# Patient Record
Sex: Female | Born: 1955 | Race: White | Hispanic: No | Marital: Married | Smoking: Never smoker
Health system: Southern US, Community
[De-identification: ages and names within clinical notes are randomized; demographics above are authoritative.]

## PROBLEM LIST (undated history)

## (undated) DIAGNOSIS — C801 Malignant (primary) neoplasm, unspecified: Secondary | ICD-10-CM

## (undated) DIAGNOSIS — Z1509 Genetic susceptibility to other malignant neoplasm: Secondary | ICD-10-CM

## (undated) DIAGNOSIS — G43909 Migraine, unspecified, not intractable, without status migrainosus: Secondary | ICD-10-CM

## (undated) DIAGNOSIS — Z1501 Genetic susceptibility to malignant neoplasm of breast: Secondary | ICD-10-CM

## (undated) DIAGNOSIS — D649 Anemia, unspecified: Secondary | ICD-10-CM

## (undated) DIAGNOSIS — T7840XA Allergy, unspecified, initial encounter: Secondary | ICD-10-CM

## (undated) DIAGNOSIS — C50919 Malignant neoplasm of unspecified site of unspecified female breast: Secondary | ICD-10-CM

## (undated) DIAGNOSIS — E041 Nontoxic single thyroid nodule: Secondary | ICD-10-CM

## (undated) HISTORY — PX: OTHER SURGICAL HISTORY: SHX169

## (undated) HISTORY — DX: Malignant neoplasm of unspecified site of unspecified female breast: C50.919

## (undated) HISTORY — PX: FOOT SURGERY: SHX648

## (undated) HISTORY — DX: Anemia, unspecified: D64.9

## (undated) HISTORY — DX: Migraine, unspecified, not intractable, without status migrainosus: G43.909

## (undated) HISTORY — DX: Nontoxic single thyroid nodule: E04.1

## (undated) HISTORY — DX: Genetic susceptibility to other malignant neoplasm: Z15.09

## (undated) HISTORY — DX: Allergy, unspecified, initial encounter: T78.40XA

## (undated) HISTORY — DX: Genetic susceptibility to malignant neoplasm of breast: Z15.01

---

## 1997-11-19 ENCOUNTER — Ambulatory Visit (HOSPITAL_COMMUNITY): Admission: RE | Admit: 1997-11-19 | Discharge: 1997-11-19 | Payer: Self-pay | Admitting: Gynecology

## 1998-05-23 ENCOUNTER — Inpatient Hospital Stay (HOSPITAL_COMMUNITY): Admission: AD | Admit: 1998-05-23 | Discharge: 1998-05-23 | Payer: Self-pay | Admitting: Gynecology

## 1998-05-23 ENCOUNTER — Encounter: Payer: Self-pay | Admitting: Gynecology

## 1998-05-24 ENCOUNTER — Ambulatory Visit (HOSPITAL_COMMUNITY): Admission: RE | Admit: 1998-05-24 | Discharge: 1998-05-24 | Payer: Self-pay | Admitting: Gynecology

## 2001-03-29 ENCOUNTER — Encounter: Payer: Self-pay | Admitting: Family Medicine

## 2001-03-29 ENCOUNTER — Encounter: Admission: RE | Admit: 2001-03-29 | Discharge: 2001-03-29 | Payer: Self-pay | Admitting: Family Medicine

## 2006-05-23 ENCOUNTER — Other Ambulatory Visit: Admission: RE | Admit: 2006-05-23 | Discharge: 2006-05-23 | Payer: Self-pay | Admitting: Family Medicine

## 2006-06-27 ENCOUNTER — Encounter: Admission: RE | Admit: 2006-06-27 | Discharge: 2006-06-27 | Payer: Self-pay | Admitting: Family Medicine

## 2006-11-14 ENCOUNTER — Other Ambulatory Visit: Admission: RE | Admit: 2006-11-14 | Discharge: 2006-11-14 | Payer: Self-pay | Admitting: Family Medicine

## 2007-12-13 ENCOUNTER — Other Ambulatory Visit: Admission: RE | Admit: 2007-12-13 | Discharge: 2007-12-13 | Payer: Self-pay | Admitting: Family Medicine

## 2007-12-24 ENCOUNTER — Encounter: Admission: RE | Admit: 2007-12-24 | Discharge: 2007-12-24 | Payer: Self-pay | Admitting: Family Medicine

## 2009-04-14 ENCOUNTER — Other Ambulatory Visit: Admission: RE | Admit: 2009-04-14 | Discharge: 2009-04-14 | Payer: Self-pay | Admitting: Family Medicine

## 2009-04-21 ENCOUNTER — Encounter: Admission: RE | Admit: 2009-04-21 | Discharge: 2009-04-21 | Payer: Self-pay | Admitting: Family Medicine

## 2012-01-12 ENCOUNTER — Other Ambulatory Visit: Payer: Self-pay | Admitting: Family Medicine

## 2012-01-12 DIAGNOSIS — Z1231 Encounter for screening mammogram for malignant neoplasm of breast: Secondary | ICD-10-CM

## 2012-02-01 ENCOUNTER — Ambulatory Visit
Admission: RE | Admit: 2012-02-01 | Discharge: 2012-02-01 | Disposition: A | Discharge status: Home / Self Care | Payer: BC Managed Care – PPO | Source: Ambulatory Visit | Attending: Family Medicine | Admitting: Family Medicine

## 2012-02-01 DIAGNOSIS — Z1231 Encounter for screening mammogram for malignant neoplasm of breast: Secondary | ICD-10-CM

## 2012-02-06 ENCOUNTER — Other Ambulatory Visit: Payer: Self-pay | Admitting: Family Medicine

## 2012-02-06 DIAGNOSIS — R928 Other abnormal and inconclusive findings on diagnostic imaging of breast: Secondary | ICD-10-CM

## 2012-02-08 ENCOUNTER — Ambulatory Visit
Admission: RE | Admit: 2012-02-08 | Discharge: 2012-02-08 | Disposition: A | Discharge status: Home / Self Care | Payer: BC Managed Care – PPO | Source: Ambulatory Visit | Attending: Family Medicine | Admitting: Family Medicine

## 2012-02-08 DIAGNOSIS — R928 Other abnormal and inconclusive findings on diagnostic imaging of breast: Secondary | ICD-10-CM

## 2013-03-18 ENCOUNTER — Other Ambulatory Visit: Payer: Self-pay | Admitting: Family Medicine

## 2013-03-18 ENCOUNTER — Ambulatory Visit
Admission: RE | Admit: 2013-03-18 | Discharge: 2013-03-18 | Disposition: A | Discharge status: Home / Self Care | Payer: BC Managed Care – PPO | Source: Ambulatory Visit | Attending: Family Medicine | Admitting: Family Medicine

## 2013-03-18 DIAGNOSIS — Z1231 Encounter for screening mammogram for malignant neoplasm of breast: Secondary | ICD-10-CM

## 2013-03-18 DIAGNOSIS — N6310 Unspecified lump in the right breast, unspecified quadrant: Secondary | ICD-10-CM

## 2013-03-19 ENCOUNTER — Ambulatory Visit
Admission: RE | Admit: 2013-03-19 | Discharge: 2013-03-19 | Disposition: A | Discharge status: Home / Self Care | Payer: BC Managed Care – PPO | Source: Ambulatory Visit | Attending: Family Medicine | Admitting: Family Medicine

## 2013-03-19 ENCOUNTER — Other Ambulatory Visit: Payer: Self-pay | Admitting: Family Medicine

## 2013-03-19 DIAGNOSIS — N6310 Unspecified lump in the right breast, unspecified quadrant: Secondary | ICD-10-CM

## 2013-03-20 ENCOUNTER — Other Ambulatory Visit: Payer: Self-pay | Admitting: Family Medicine

## 2013-03-20 ENCOUNTER — Ambulatory Visit
Admission: RE | Admit: 2013-03-20 | Discharge: 2013-03-20 | Disposition: A | Discharge status: Home / Self Care | Payer: BC Managed Care – PPO | Source: Ambulatory Visit | Attending: Family Medicine | Admitting: Family Medicine

## 2013-03-20 DIAGNOSIS — C50919 Malignant neoplasm of unspecified site of unspecified female breast: Secondary | ICD-10-CM

## 2013-03-20 DIAGNOSIS — N6310 Unspecified lump in the right breast, unspecified quadrant: Secondary | ICD-10-CM

## 2013-03-21 ENCOUNTER — Telehealth: Payer: Self-pay | Admitting: *Deleted

## 2013-03-21 DIAGNOSIS — C50411 Malignant neoplasm of upper-outer quadrant of right female breast: Secondary | ICD-10-CM | POA: Insufficient documentation

## 2013-03-21 NOTE — Telephone Encounter (Signed)
Called and spoke with patient and confirmed [facility] appt for 03/27/13 at 0800.  Instructions and contact information given.

## 2013-03-26 ENCOUNTER — Ambulatory Visit: Payer: BC Managed Care – PPO

## 2013-03-26 ENCOUNTER — Ambulatory Visit
Admission: RE | Admit: 2013-03-26 | Discharge: 2013-03-26 | Disposition: A | Discharge status: Home / Self Care | Payer: BC Managed Care – PPO | Source: Ambulatory Visit | Attending: Family Medicine | Admitting: Family Medicine

## 2013-03-26 DIAGNOSIS — C50919 Malignant neoplasm of unspecified site of unspecified female breast: Secondary | ICD-10-CM

## 2013-03-26 MED ORDER — GADOBENATE DIMEGLUMINE 529 MG/ML IV SOLN
11.0000 mL | Freq: Once | INTRAVENOUS | Status: AC | PRN
  Administered 2013-03-26: 11 mL via INTRAVENOUS

## 2013-03-27 ENCOUNTER — Ambulatory Visit (HOSPITAL_BASED_OUTPATIENT_CLINIC_OR_DEPARTMENT_OTHER): Payer: BC Managed Care – PPO | Admitting: Oncology

## 2013-03-27 ENCOUNTER — Other Ambulatory Visit (HOSPITAL_BASED_OUTPATIENT_CLINIC_OR_DEPARTMENT_OTHER): Payer: BC Managed Care – PPO | Admitting: Lab

## 2013-03-27 ENCOUNTER — Encounter (INDEPENDENT_AMBULATORY_CARE_PROVIDER_SITE_OTHER): Payer: Self-pay | Admitting: Surgery

## 2013-03-27 ENCOUNTER — Telehealth: Payer: Self-pay | Admitting: Oncology

## 2013-03-27 ENCOUNTER — Encounter: Payer: Self-pay | Admitting: *Deleted

## 2013-03-27 ENCOUNTER — Ambulatory Visit: Payer: BC Managed Care – PPO | Attending: Surgery | Admitting: Physical Therapy

## 2013-03-27 ENCOUNTER — Ambulatory Visit (HOSPITAL_BASED_OUTPATIENT_CLINIC_OR_DEPARTMENT_OTHER): Payer: BC Managed Care – PPO | Admitting: Surgery

## 2013-03-27 ENCOUNTER — Encounter: Payer: Self-pay | Admitting: Oncology

## 2013-03-27 ENCOUNTER — Telehealth: Payer: Self-pay | Admitting: *Deleted

## 2013-03-27 ENCOUNTER — Ambulatory Visit: Payer: BC Managed Care – PPO

## 2013-03-27 ENCOUNTER — Ambulatory Visit
Admission: RE | Admit: 2013-03-27 | Discharge: 2013-03-27 | Disposition: A | Discharge status: Home / Self Care | Payer: BC Managed Care – PPO | Source: Ambulatory Visit | Attending: Radiation Oncology | Admitting: Radiation Oncology

## 2013-03-27 ENCOUNTER — Other Ambulatory Visit: Payer: Self-pay | Admitting: *Deleted

## 2013-03-27 VITALS — BP 138/87 | HR 77 | Temp 98.4°F | Resp 20 | Ht 67.5 in | Wt 125.0 lb

## 2013-03-27 DIAGNOSIS — IMO0001 Reserved for inherently not codable concepts without codable children: Secondary | ICD-10-CM | POA: Insufficient documentation

## 2013-03-27 DIAGNOSIS — C50919 Malignant neoplasm of unspecified site of unspecified female breast: Secondary | ICD-10-CM | POA: Insufficient documentation

## 2013-03-27 DIAGNOSIS — C50411 Malignant neoplasm of upper-outer quadrant of right female breast: Secondary | ICD-10-CM

## 2013-03-27 DIAGNOSIS — Z171 Estrogen receptor negative status [ER-]: Secondary | ICD-10-CM

## 2013-03-27 DIAGNOSIS — C50419 Malignant neoplasm of upper-outer quadrant of unspecified female breast: Secondary | ICD-10-CM

## 2013-03-27 DIAGNOSIS — Z01818 Encounter for other preprocedural examination: Secondary | ICD-10-CM | POA: Insufficient documentation

## 2013-03-27 LAB — CBC WITH DIFFERENTIAL/PLATELET
BASO%: 0.6 % (ref 0.0–2.0)
HCT: 39.8 % (ref 34.8–46.6)
MCHC: 34.5 g/dL (ref 31.5–36.0)
MONO#: 0.3 10*3/uL (ref 0.1–0.9)
RBC: 4.25 10*6/uL (ref 3.70–5.45)
WBC: 4 10*3/uL (ref 3.9–10.3)
lymph#: 1 10*3/uL (ref 0.9–3.3)

## 2013-03-27 LAB — COMPREHENSIVE METABOLIC PANEL (CC13)
ALT: 29 U/L (ref 0–55)
CO2: 24 mEq/L (ref 22–29)
Calcium: 10.3 mg/dL (ref 8.4–10.4)
Chloride: 108 mEq/L (ref 98–109)
Potassium: 4.3 mEq/L (ref 3.5–5.1)
Sodium: 142 mEq/L (ref 136–145)
Total Protein: 6.2 g/dL — ABNORMAL LOW (ref 6.4–8.3)

## 2013-03-27 NOTE — Progress Notes (Signed)
Patient ID: Janet [name], female   DOB: 02/16/1956, 57 y.o.   MRN: [id number]  Chief Complaint  Patient presents with  . Breast Cancer    Right    HPI Janet [name] is a 57 y.o. female.  She recently felt a lump in her right breast in the nipple areolar area. 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 He is over 3 cm in size. She has suspicious lymph node in the right axilla but a biopsy was negative. She develops a swelling in the axilla since the biopsy as well as bruising of the breast. She has not had any other breast problems considered herself in extremely healthy. She is active, runs and works out and works full-time teaching law. She is already been seen by the medical and radiation oncologist.  HPI  History reviewed. No pertinent past medical history.  Past Surgical History  Procedure Laterality Date  . Eye surgery      Family History  Problem Relation Age of Onset  . Pancreatic cancer Mother   . Breast cancer Maternal Grandmother     Social History History  Substance Use Topics  . Smoking status: Never Smoker   . Smokeless tobacco: Not on file  . Alcohol Use: Yes     Comment: occasional    Allergies  Allergen Reactions  . Penicillins     Current Outpatient Prescriptions  Medication Sig Dispense Refill  . acetaminophen (TYLENOL) 325 MG tablet Take 650 mg by mouth every 6 (six) hours as needed for pain.      . CALCIUM PO Take by mouth daily.      . Multiple Vitamin (MULTI-VITAMIN DAILY PO) Take by mouth daily.       No current facility-administered medications for this visit.    Review of Systems Review of Systems  Constitutional: Negative for fever, chills and unexpected weight change.  HENT: Negative for hearing loss, congestion, sore throat, trouble swallowing and voice change.   Eyes: Negative for visual disturbance.  Respiratory: Negative for cough and wheezing.   Cardiovascular: Negative for chest  pain, palpitations and leg swelling.  Gastrointestinal: Negative for nausea, vomiting, abdominal pain, diarrhea, constipation, blood in stool, abdominal distention and anal bleeding.  Genitourinary: Negative for hematuria, vaginal bleeding and difficulty urinating.  Musculoskeletal: Negative for arthralgias.  Skin: Negative for rash and wound.  Neurological: Negative for seizures, syncope and headaches.  Hematological: Negative for adenopathy. Does not bruise/bleed easily.  Psychiatric/Behavioral: Negative for confusion.    Blood pressure 138/87, pulse 77, temperature 98.4 F (36.9 C), resp. rate 20, height 5' 7.5" (1.715 m), weight 125 lb (56.7 kg).  Physical Exam Physical Exam  Vitals reviewed. Constitutional: She is oriented to person, place, and time. She appears well-developed and well-nourished. No distress.  HENT:  Head: Normocephalic and atraumatic.  Mouth/Throat: Oropharynx is clear and moist.  Eyes: Conjunctivae and EOM are normal. Pupils are equal, round, and reactive to light. No scleral icterus.  Neck: Normal range of motion. Neck supple. No tracheal deviation present. No thyromegaly present.  Cardiovascular: Normal rate, regular rhythm, normal heart sounds and intact distal pulses.  Exam reveals no gallop and no friction rub.   No murmur heard. Pulmonary/Chest: Effort normal and breath sounds normal. No respiratory distress. She has no wheezes. She has no rales. Right breast exhibits mass. Right breast exhibits no inverted nipple, no nipple discharge, no skin change and no tenderness. Left breast exhibits no inverted nipple,   no mass, no nipple discharge, no skin change and no tenderness. Breasts are symmetrical.    Abdominal: Soft. Bowel sounds are normal. She exhibits no distension and no mass. There is no tenderness. There is no rebound and no guarding.  Musculoskeletal: Normal range of motion. She exhibits no edema and no tenderness.  Lymphadenopathy:    She has no  cervical adenopathy.    She has axillary adenopathy.       Right axillary: Pectoral adenopathy present.       Right: No supraclavicular adenopathy present.       Left: No supraclavicular adenopathy present.  Soft oblong right axillary mass - apparently developed after biopsy, likely hematoma  Neurological: She is alert and oriented to person, place, and time.  Skin: Skin is warm and dry. No rash noted. She is not diaphoretic. No erythema.  Psychiatric: She has a normal mood and affect. Her behavior is normal. Judgment and thought content normal.    Data Reviewed I reviewed the mammogram films and reports, the MRI films reports and discuss with the radiologist; I have discussed and reviewed the pathology slides for the pathologist; I discussed her situation with the medical & radiation oncologist.  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     Plan    I have explained the pathophysiology and staging of breast cancer with particular attention to her exact situation. We discussed the multidisciplinary approach to breast cancer which often includes both medical and radiation oncology consultations.  We also discussed surgical options for the treatment of breast cancer including lumpectomy and mastectomy with possible reconstructive surgery. In addition we talked about the evaluation and management of lymph nodes including a description of sentinel lymph node biopsy and axillary dissections. We reviewed potential complications and risks including bleeding, infection, numbness,  lymphedema, and the potential need for additional surgery.  She understands that for patients who are candidate for lumpectomy or mastectomy there is an equal survival rate with either technique, but a slightly higher local recurrence rate with lumpectomy. In addition she knows that a lumpectomy usually requires postoperative radiation as part of the management of the  breast cancer.  We have discussed the likely postoperative course and plans for followup.  I have given the patient some written information that reviewed all of these issues. I believe her questions are answered and that she has a good understanding of the issues.  I think she would benefit from neoadjuvant chemotherapy. She beginning chemotherapy whether she does have pre-or post surgery. If we can shrink the size of tumor I think she will have less deformity after a central partial mastectomy. Neutrexin of it might be possible to preserve the nipple and areolar complex not certain of that. I reviewed all that with the patient so that she understands that we may still end up needing to do a significant central partial mastectomy.  I've also discussed Port-A-Cath placement with her and will go ahead and schedule this.       Kuuipo Anzaldo J 03/27/2013, 11:55 AM    

## 2013-03-27 NOTE — Progress Notes (Signed)
Checked in new pt with no financial concerns. °

## 2013-03-27 NOTE — Patient Instructions (Signed)
After we discussed this with the medical oncologist, if you need a Port-A-Cath we will get this scheduled. I would like to see you in about 10 weeks, about halfway through your chemotherapy. We can assess how this is working and make more definitive plans for surgery at the completion of the chemotherapy.

## 2013-03-27 NOTE — Progress Notes (Signed)
[facility] Radiation Oncology NEW PATIENT EVALUATION  Name: Janet [name] MRN: [id number]  Date:   03/27/2013           DOB: October 26, 1955  Status: outpatient   CC: Astrid Divine, MD  Streck, Reola Mosher, MD    REFERRING PHYSICIAN: Currie Paris, MD   DIAGNOSIS: Stage IIA (T2 N0 M0) invasive ductal carcinoma of the right breast   HISTORY OF PRESENT ILLNESS:  Janet [name] is a 57 y.o. female who is seen today through the courtesy of Dr. Jamey Ripa at the [facility]. for discussion of radiation therapy in the management of her T2 N0 invasive ductal carcinoma of the right breast. She recently noted a subareolar mass on the right. A screening mammogram on 03/18/2013 showed a subareolar mass which on ultrasound measured 2.6 x 1.7 x 2.4 cm within the right breast at 11:00 and the subareolar location. There was also a suspicious lymph node within the right axilla measure 1.1 cm. On 03/19/2013 she underwent ultrasound-guided biopsies of the right breast and right axilla. On review of her pathology she was found to have invasive ductal carcinoma which was triple negative. The axillary node biopsy was negative. Breast MR on 03/26/2013 showed a 3.8 x 2.9 x 2.5 cm right breast mass along with a prominent right axillary lymph node with edema. She is without complaints today.  PREVIOUS RADIATION THERAPY: No   PAST MEDICAL HISTORY:  has a past medical history of Migraines.     PAST SURGICAL HISTORY:  Past Surgical History  Procedure Laterality Date  . Eye surgery       FAMILY HISTORY:  Her father died of pancreatic cancer in his 57s. Her mother is alive and well at 55. Her maternal grandmother died of breast cancer at 67.   SOCIAL HISTORY:  reports that she has never smoked. She does not have any smokeless tobacco history on file. She reports that  drinks alcohol. She reports that she does not use illicit drugs. Married, 3 children. She teaches law.   ALLERGIES:  Penicillins   MEDICATIONS:  Current Outpatient Prescriptions  Medication Sig Dispense Refill  . acetaminophen (TYLENOL) 325 MG tablet Take 650 mg by mouth every 6 (six) hours as needed for pain.      Marland Kitchen CALCIUM PO Take by mouth daily.      . Multiple Vitamin (MULTI-VITAMIN DAILY PO) Take by mouth daily.       No current facility-administered medications for this encounter.     REVIEW OF SYSTEMS:  Pertinent items are noted in HPI.    PHYSICAL EXAM: Alert and oriented 57 year old white female appearing her stated age. Head and neck examination: Grossly unremarkable. Nodes: Without palpable cervical, supraclavicular lymphadenopathy. There is a 3.5 cm palpable node within the right axilla which is felt to represent postbiopsy changes/hematoma. Chest: Lungs clear. Heart: Regular in rhythm. Breasts: There is a palpable 3 cm mass centered along the subareolar location at 11:00. There is no obvious skin involvement. There is no nipple retraction. Left breast without masses or lesions. Abdomen without hepatomegaly. Extremities: Without edema.    LABORATORY DATA:  Lab Results  Component Value Date   WBC 4.0 03/27/2013   HGB 13.7 03/27/2013   HCT 39.8 03/27/2013   MCV 93.7 03/27/2013   PLT 180 03/27/2013   Lab Results  Component Value Date   NA 142 03/27/2013   K 4.3 03/27/2013   CO2 24 03/27/2013   Lab Results  Component Value Date  ALT 29 03/27/2013   AST 21 03/27/2013   ALKPHOS 73 03/27/2013   BILITOT 0.46 03/27/2013      IMPRESSION: Stage IIA (T2 N0 M0) invasive ductal carcinoma of the right breast. I explained to the patient that her local treatment options include mastectomy with a sentinel lymph node biopsy or a partial mastectomy with sentinel lymph node biopsy. I defer to Dr. Darnelle Catalan and Dr. Jamey Ripa as to whether not she would benefit from neoadjuvant chemotherapy which may limit the extent of her surgery. In any case, she will be offered chemotherapy. She desires breast preservation and she  understands that she may require removal of the nipple areolar complex even with neoadjuvant chemotherapy.. We discussed the potential acute and late toxicities of radiation therapy.   PLAN: As discussed above.  I spent 40 minutes minutes face to face with the patient and more than 50% of that time was spent in counseling and/or coordination of care.

## 2013-03-27 NOTE — Telephone Encounter (Signed)
Per staff message and POF I have scheduled appts.  JMW  

## 2013-03-27 NOTE — Progress Notes (Signed)
Patient ID: DANEISHA SURGES, female   DOB: 10-22-1955, 57 y.o.   MRN: [id number] ID: [name] OB: 1956-04-18  MR#: [id number]  NWG#:[id number]  PCP: Astrid Divine, MD GYN:   SU: Cicero Duck OTHER MD: Chipper Herb, Maryelizabeth Kaufmann   HISTORY OF PRESENT ILLNESS: "[name]" herself noted a lump in her right breast on 03/17/2013, and immediately contacted her physician so that on 03/18/2013 bilateral diagnostic mammography and right breast ultrasonography at the breast Center showed a developing mass in the subareolar region of the right breast. There were no malignant type calcifications. This mass was palpable in the upper outer quadrant, and by ultrasound measured 2.6 cm. Ultrasonography of the right axilla showed a lymph node measuring 1.1 cm, with a thickened cortex. The left breast was unremarkable.  Biopsy of the right breast mass 08/26//2014 showed ([name]) and invasive ductal carcinoma, grade 3, triple negative, with an MIB-1 of 54%. Biopsy of the right axillary lymph node in question at the same time was negative.  Bilateral breast MRI 03/26/2013 measured the right subareolar mass at 3.8 cm. The previously biopsied benign right axillary lymph node was again noted. There were no other suspicious areas in the right breast or axilla and the left breast was unremarkable.  Her subsequent history is as detailed below  INTERVAL HISTORY: [name] was seen in the multidisciplinary breast cancer clinic 03/27/2013 accompanied by her husband Robby.  REVIEW OF SYSTEMS: She had recent cataract surgery with much improved vision. Otherwise a detailed review of systems today was entirely negative.   PAST MEDICAL HISTORY: Past Medical History  Diagnosis Date  . Migraines     PAST SURGICAL HISTORY: Past Surgical History  Procedure Laterality Date  . Eye surgery      FAMILY HISTORY Family History  Problem Relation Age of Onset  . Pancreatic cancer Mother   . Breast cancer  Maternal Grandmother    the patient's father died at the age of 68 with pancreatic cancer. (He was Dr. Chrystine Oiler). [name] is alive at age 27. Sallye Ober has one brother, no sisters. [name]'s mother died from breast cancer at the age of 27. There is no history of ovarian cancer in the family  GYNECOLOGIC HISTORY:  Menarche age 35, first live birth age 18. She is GX P2. She stopped having periods approximately 2004. She did not take hormone replacement. She did take birth control pills remotely for 5-10 years, with no complications.  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 She has a very busy teaching schedule, with a very long day on Monday, and all morning on Wednesdays and Fridays. Her husband the [place]. [place] Hockey "Robby" Osmanovic, is a local judge. The patient's 2 children are mildly who lives in [place] and works as a Financial risk analyst, and [place], who works in [place] and is an associate with a Set designer group.  ADVANCED DIRECTIVES: In place   HEALTH MAINTENANCE: History  Substance Use Topics  . Smoking status: Never Smoker   . Smokeless tobacco: Not on file  . Alcohol Use: Yes     Comment: occasional     Colonoscopy:  PAP:  Bone density:  Lipid panel:  Allergies  Allergen Reactions  . Penicillins     Current Outpatient Prescriptions  Medication Sig Dispense Refill  . acetaminophen (TYLENOL) 325 MG tablet Take 650 mg  by mouth every 6 (six) hours as needed for pain.      Marland Kitchen CALCIUM PO Take by mouth daily.      . Multiple Vitamin (MULTI-VITAMIN DAILY PO) Take by mouth daily.       No current facility-administered medications for this visit.    OBJECTIVE: Middle-aged white woman who appears well Filed Vitals:   03/27/13 0824  BP: 138/87  Pulse: 77  Temp: 98.4 F (36.9 C)  Resp: 20     Body mass index is 19.28 kg/(m^2).    ECOG FS:0 -  Asymptomatic  Sclerae unicteric Oropharynx clear No cervical or supraclavicular adenopathy Lungs no rales or rhonchi Heart regular rate and rhythm Abd benign MSK no focal spinal tenderness, no peripheral edema Neuro: non-focal, well-oriented, appropriate affect Breasts: The right breast mass is easily palpable under the areola around 11:00. Is movable, with no skin involvement or nipple retraction. I do not palpate a right axillary mass. The left breast is unremarkable   LAB RESULTS:  CMP     Component Value Date/Time   NA 142 03/27/2013 0807   K 4.3 03/27/2013 0807   CO2 24 03/27/2013 0807   GLUCOSE 87 03/27/2013 0807   BUN 16.7 03/27/2013 0807   CREATININE 0.8 03/27/2013 0807   CALCIUM 10.3 03/27/2013 0807   PROT 6.2* 03/27/2013 0807   ALBUMIN 3.6 03/27/2013 0807   AST 21 03/27/2013 0807   ALT 29 03/27/2013 0807   ALKPHOS 73 03/27/2013 0807   BILITOT 0.46 03/27/2013 0807    I No results found for this basename: SPEP,  UPEP,   kappa and lambda light chains    Lab Results  Component Value Date   WBC 4.0 03/27/2013   NEUTROABS 2.6 03/27/2013   HGB 13.7 03/27/2013   HCT 39.8 03/27/2013   MCV 93.7 03/27/2013   PLT 180 03/27/2013      Chemistry      Component Value Date/Time   NA 142 03/27/2013 0807   K 4.3 03/27/2013 0807   CO2 24 03/27/2013 0807   BUN 16.7 03/27/2013 0807   CREATININE 0.8 03/27/2013 0807      Component Value Date/Time   CALCIUM 10.3 03/27/2013 0807   ALKPHOS 73 03/27/2013 0807   AST 21 03/27/2013 0807   ALT 29 03/27/2013 0807   BILITOT 0.46 03/27/2013 0807       No results found for this basename: LABCA2    No components found with this basename: LABCA125    No results found for this basename: INR,  in the last 168 hours  Urinalysis No results found for this basename: colorurine,  appearanceur,  labspec,  phurine,  glucoseu,  hgbur,  bilirubinur,  ketonesur,  proteinur,  urobilinogen,  nitrite,  leukocytesur    STUDIES: US Breast Right  03/18/2013   *RADIOLOGY REPORT*  Clinical  Data:  Palpable left breast mass  DIGITAL DIAGNOSTIC BILATERAL MAMMOGRAM WITH CAD AND RIGHT BREAST ULTRASOUND:  Comparison:  With priors  Findings:  ACR Breast Density Category c:  The breast tissue is heterogeneously dense, which may obscure small masses.  There is an obscured, developing mass in the subareolar region of the right breast.  There are no malignant-type microcalcifications. The left breast is negative.  Mammographic images were processed with CAD.  On physical exam, I palpate a discrete mass in the 11 o'clock subareolar region of the right breast.  Ultrasound is performed, showing there is hypoechoic macrolobulated mass in the right breast 11 o'clock in a subareolar  location measuring 2.6 x 1.7 x 2.4 cm.  Sonographic evaluation of the right axilla shows a lymph node with a thickened cortex measuring 1.1 cm.  IMPRESSION: Imaging findings are worrisome for right breast invasive mammary carcinoma with axillary metastasis.  RECOMMENDATION: Ultrasound guided core biopsies of the right breast mass as well as the right axilla is recommended will be scheduled at the patient's convenience.  I have discussed the findings and recommendations with the patient. Results were also provided in writing at the conclusion of the visit.  If applicable, a reminder letter will be sent to the patient regarding the next appointment.  BI-RADS CATEGORY 4:  Suspicious abnormality - biopsy should be considered.   Original Report Authenticated By: Baird Lyons, M.D.   Mr Breast Bilateral W Wo Contrast  03/26/2013   *RADIOLOGY REPORT*  Clinical Data:New diagnosis of right breast cancer.  LABS  BILATERAL BREAST MRI WITH AND WITHOUT CONTRAST  Technique: Multiplanar, multisequence MR images of both breasts were obtained prior to and following the intravenous administration of 11ml of Multihance.  THREE-DIMENSIONAL MR IMAGE RENDERING ON INDEPENDENT WORKSTATION: Three-dimensional MR images were rendered by post-processing  of the  original MR data on an independent DynaCad workstation. The three-dimensional MR images were interpreted, and findings are reported in the following complete MRI report for this study.  Comparison:  [facility]amm[facility] 03/19/2013 and earlier  FINDINGS:  Breast composition:  d:  The breast tissue is extremely dense, which lowers the sensitivity of mammography.  Background parenchymal enhancement: Moderate  Right breast:  Within the medial subareolar region of the right breast, there is an enhancing mass which measures 3.8 x 2.9 x 2.5 cm.  This demonstrates fast wash-in/washout type enhancement kinetics and contains a small tissue void artifact from recent biopsy clip placed at the time of biopsy under ultrasound guidance. Biopsy showed invasive ductal carcinoma.  Elsewhere within the right breast, there are scattered foci of enhancement been no suspicious enhancement identified.  Left breast:  Scattered foci of enhancement.  No suspicious enhancement identified.  Lymph nodes:  A single right axillary lymph node shows thickened cortex.  Biopsy showed no malignancy.There is right axillary edema following ultrasound-guided core biopsy.The left axilla is negative for suspicious lymph nodes.  Ancillary findings:  None.  IMPRESSION:  1.  Solitary subareolar mass in the right breast, consistent with known malignancy. 2.  Edema and prominent right axillary lymph node, negative by biopsy.  RECOMMENDATION:  Treatment plan.  BI-RADS CATEGORY 6:  Known biopsy-proven malignancy - appropriate action should be taken.   Original Report Authenticated By: Norva Pavlov, M.D.   Mm Digital Diagnostic Bilat  03/18/2013   *RADIOLOGY REPORT*  Clinical Data:  Palpable left breast mass  DIGITAL DIAGNOSTIC BILATERAL MAMMOGRAM WITH CAD AND RIGHT BREAST ULTRASOUND:  Comparison:  With priors  Findings:  ACR Breast Density Category c:  The breast tissue is heterogeneously dense, which may obscure small  masses.  There is an obscured, developing mass in the subareolar region of the right breast.  There are no malignant-type microcalcifications. The left breast is negative.  Mammographic images were processed with CAD.  On physical exam, I palpate a discrete mass in the 11 o'clock subareolar region of the right breast.  Ultrasound is performed, showing there is hypoechoic macrolobulated mass in the right breast 11 o'clock in a subareolar location measuring 2.6 x 1.7 x 2.4 cm.  Sonographic evaluation of the right axilla shows a lymph node with a thickened  cortex measuring 1.1 cm.  IMPRESSION: Imaging findings are worrisome for right breast invasive mammary carcinoma with axillary metastasis.  RECOMMENDATION: Ultrasound guided core biopsies of the right breast mass as well as the right axilla is recommended will be scheduled at the patient's convenience.  I have discussed the findings and recommendations with the patient. Results were also provided in writing at the conclusion of the visit.  If applicable, a reminder letter will be sent to the patient regarding the next appointment.  BI-RADS CATEGORY 4:  Suspicious abnormality - biopsy should be considered.   Original Report Authenticated By: Baird Lyons, M.D.   Mm Digital Diagnostic Unilat R  03/19/2013   *RADIOLOGY REPORT*  Clinical Data:  Status post ultrasound guided right breast biopsy.  DIGITAL DIAGNOSTIC RIGHT MAMMOGRAM  Comparison:  Previous exams.  Findings:  Mammographic images were obtained following ultrasound guided biopsy of a right breast mass. Post clip images show a ribbon shaped clip in the anticipated position in the upper subareolar right breast.  IMPRESSION: Biopsy clip in the anticipated location.   Original Report Authenticated By: Jerene Dilling, M.D.   Mm Radiologist Eval And Mgmt  03/20/2013   *RADIOLOGY REPORT*  ESTABLISHED PATIENT OFFICE VISIT - LEVEL II [phone])  Chief Complaint:  Patient status post ultrasound-guided core needle  biopsy of right breast mass, presenting for pathology results.  History:  Newly palpable right breast mass status post ultrasound- guided core needle biopsy.  Exam:  The biopsy sites involving the right breast and the right axilla are unremarkable without significant erythema, hematoma or drainage.  Pathology: Right breast mass compatible with invasive carcinoma.  Assessment and Plan:  The next steps including the treatment plan to include breast MRI and multidisciplinary breast conference appointments were discussed with the patient.   Original Report Authenticated By: Annia Belt, M.D   [place] Rt Breast Bx W Loc Dev 1st Lesion Img Bx Spec US Guide  03/19/2013   *RADIOLOGY REPORT*  Clinical Data:  Right breast mass and abnormal right axillary lymph node.  ULTRASOUND GUIDED VACUUM ASSISTED CORE BIOPSY OF THE RIGHT BREAST  Comparison: Previous exams.  I met with the patient and we discussed the procedure of ultrasound- guided biopsy, including benefits and alternatives.  We discussed the high likelihood of a successful procedure. We discussed the risks of the procedure including infection, bleeding, tissue injury, clip migration, and inadequate sampling.  Informed written consent was given. The usual time-out protocol was performed immediately prior to the procedure.  Using sterile technique and 2% Lidocaine as local anesthetic, under direct ultrasound visualization, a 12 gauge vacuum-assisted device was used to perform biopsy of right breast mass using a lateral to medial approach. At the conclusion of the procedure, a ribbon shaped tissue marker clip was deployed into the biopsy cavity. Follow-up 2-view mammogram was performed and dictated separately.  IMPRESSION: Ultrasound-guided biopsy of right breast mass.  No apparent complications.   Original Report Authenticated By: Jerene Dilling, M.D.   [place] Rt Breast Bx W Loc Dev Ea Add Lesion Img Bx Spec US Guide  03/19/2013   *RADIOLOGY REPORT*  Clinical Data:  Right  breast mass and abnormal right axillary lymph node.  ULTRASOUND GUIDED VACUUM ASSISTED CORE BIOPSY OF THE RIGHT AXILLA  Comparison: Previous exams.  I met with the patient and we discussed the procedure of ultrasound- guided biopsy, including benefits and alternatives.  We discussed the high likelihood of a successful procedure. We discussed the risks of the procedure including infection, bleeding, tissue injury,  clip migration, and inadequate sampling.  Informed written consent was given. The usual time-out protocol was performed immediately prior to the procedure.  Using sterile technique and 2% Lidocaine as local anesthetic, under direct ultrasound visualization, a 12 gauge vacuum-assisted device was used to perform biopsy of right axillary lymph node using a inferior to superior approach.  IMPRESSION: Ultrasound-guided biopsy of a right axillary lymph node.  No apparent complications.   Original Report Authenticated By: Jerene Dilling, M.D.    ASSESSMENT: 57 y.o. [place] woman status post right breast biopsy 03/19/2013 for a clinical T2 N0, stage IIA invasive ductal breast cancer, grade 3, triple negative, with an MIB-1 of 54%.  (1) suspicious right axillary lymph node biopsy negative  PLAN: We spent the better part of today's hour-long visit discussing the biology of breast cancer in general and the specifics of for a lung tumor. She understands that she is very likely to lose the nipple/areolar complex regardless of whether she has neoadjuvant or adjuvant chemotherapy. However there is a slight chance you might be able to keep her nipple depending on response, and there is no difference in survival whether she has chemotherapy first or surgery first.  After much discussion we decided it was best for her to start with chemotherapy, and she accordingly has been set up for a port, echocardiogram, and "chemotherapy school". The plan will be for her to receive 4 cycles of doxorubicin and  cyclophosphamide at standard doses every 2 weeks x4, with Neulasta support, to be followed by weekly carboplatin and paclitaxel x12, again at standard doses. She understands the goal of treatment is cure. We also discussed briefly the possible toxicities, side effects and complications of treatment, which will be discussed in more detail in "chemotherapy school", and at our next visit, September 18, when she will also receive her prescriptions for anti-emetics and other supportive meds.  [name] has a very complex scheduled which is very rigid and we will do our best to accommodate it. For that reason I have written for her chemotherapy to start at 1 PM on Wednesdays. Once we get to the second part of her chemotherapy, the weekly treatment, we will switch to Friday afternoons treatments.  The patient is a good understanding of this plan and is very much in agreement with that. She knows to call for any problems that may develop before her next visit here.   Lowella Dell, MD   03/27/2013 12:34 PM

## 2013-03-28 ENCOUNTER — Other Ambulatory Visit: Payer: Self-pay | Admitting: Oncology

## 2013-03-28 ENCOUNTER — Encounter: Payer: Self-pay | Admitting: *Deleted

## 2013-03-28 ENCOUNTER — Other Ambulatory Visit: Payer: BC Managed Care – PPO

## 2013-03-28 NOTE — Progress Notes (Signed)
[facility] Clinical Social Work  Clinical Social Work met with Pt and her husband during breast clinic to review distress screen and discuss [facility] resources/support team. CSW reviewed distress screen with Pt and discussed further. CSW reviewed distress screen with Pt and Pt scored a 7, but now feels about a 4. Pt made many, many notes on her distress screen as to the items that were causing distress. Those included anxiety about her appointment, not being able to continue work obligations and not knowing her plan for tx. CSW discussed these concerns further and problem solved as well as provided supportive listening. CSW tried to "normalize" the wide range of emotions Pts often feel. CSW provided Pt and husband with support staff handout and calendar. Pt was very interested in many of the groups and programs. She agrees to reach out for asst. as needed.   Doreen Salvage, LCSW Clinical Social Worker [facility] Wednesday, Thursday and Friday Phone: [phone] Fax: [phone]

## 2013-03-29 ENCOUNTER — Telehealth: Payer: Self-pay | Admitting: Oncology

## 2013-03-29 NOTE — Telephone Encounter (Signed)
, °

## 2013-04-01 ENCOUNTER — Telehealth: Payer: Self-pay | Admitting: *Deleted

## 2013-04-01 ENCOUNTER — Encounter (HOSPITAL_COMMUNITY): Payer: Self-pay | Admitting: *Deleted

## 2013-04-01 NOTE — Telephone Encounter (Signed)
sw pt gv a lab appt for 04/23/13@ 3pm, and inj for 9/25 @ 3:15pm..the patient is aware...td

## 2013-04-02 ENCOUNTER — Ambulatory Visit (HOSPITAL_COMMUNITY)
Admission: RE | Admit: 2013-04-02 | Discharge: 2013-04-02 | Disposition: A | Discharge status: Home / Self Care | Payer: BC Managed Care – PPO | Source: Ambulatory Visit | Attending: Family Medicine | Admitting: Family Medicine

## 2013-04-02 DIAGNOSIS — Z01818 Encounter for other preprocedural examination: Secondary | ICD-10-CM | POA: Insufficient documentation

## 2013-04-02 DIAGNOSIS — Z5111 Encounter for antineoplastic chemotherapy: Secondary | ICD-10-CM

## 2013-04-02 DIAGNOSIS — C50919 Malignant neoplasm of unspecified site of unspecified female breast: Secondary | ICD-10-CM

## 2013-04-02 NOTE — Progress Notes (Signed)
Echocardiogram 2D Echocardiogram has been performed.  Janet [name] 04/02/2013, 3:16 PM

## 2013-04-04 ENCOUNTER — Encounter (HOSPITAL_COMMUNITY): Payer: Self-pay | Admitting: Anesthesiology

## 2013-04-04 ENCOUNTER — Ambulatory Visit (HOSPITAL_COMMUNITY): Payer: BC Managed Care – PPO

## 2013-04-04 ENCOUNTER — Ambulatory Visit (HOSPITAL_COMMUNITY)
Admission: RE | Admit: 2013-04-04 | Discharge: 2013-04-04 | Disposition: A | Discharge status: Home / Self Care | Payer: BC Managed Care – PPO | Source: Ambulatory Visit | Attending: Surgery | Admitting: Surgery

## 2013-04-04 ENCOUNTER — Encounter (HOSPITAL_COMMUNITY)
Admission: RE | Disposition: A | Discharge status: Home / Self Care | Payer: Self-pay | Source: Ambulatory Visit | Attending: Surgery

## 2013-04-04 ENCOUNTER — Encounter (HOSPITAL_COMMUNITY): Payer: Self-pay | Admitting: *Deleted

## 2013-04-04 ENCOUNTER — Ambulatory Visit (HOSPITAL_COMMUNITY): Payer: BC Managed Care – PPO | Admitting: Anesthesiology

## 2013-04-04 DIAGNOSIS — C50419 Malignant neoplasm of upper-outer quadrant of unspecified female breast: Secondary | ICD-10-CM

## 2013-04-04 DIAGNOSIS — C50919 Malignant neoplasm of unspecified site of unspecified female breast: Secondary | ICD-10-CM | POA: Insufficient documentation

## 2013-04-04 DIAGNOSIS — C50411 Malignant neoplasm of upper-outer quadrant of right female breast: Secondary | ICD-10-CM

## 2013-04-04 HISTORY — PX: PORTACATH PLACEMENT: SHX2246

## 2013-04-04 HISTORY — DX: Malignant (primary) neoplasm, unspecified: C80.1

## 2013-04-04 SURGERY — INSERTION, TUNNELED CENTRAL VENOUS DEVICE, WITH PORT
Anesthesia: Monitor Anesthesia Care | Site: Chest | Wound class: Clean

## 2013-04-04 MED ORDER — BUPIVACAINE HCL 0.25 % IJ SOLN
INTRAMUSCULAR | Status: DC | PRN
  Administered 2013-04-04: 10 mL

## 2013-04-04 MED ORDER — FENTANYL CITRATE 0.05 MG/ML IJ SOLN
25.0000 ug | INTRAMUSCULAR | Status: DC | PRN
  Administered 2013-04-04 (×2): 50 ug via INTRAVENOUS

## 2013-04-04 MED ORDER — MEPERIDINE HCL 50 MG/ML IJ SOLN: 6.2500 mg | INTRAMUSCULAR | Status: DC | PRN

## 2013-04-04 MED ORDER — PROMETHAZINE HCL 25 MG/ML IJ SOLN: 6.2500 mg | INTRAMUSCULAR | Status: DC | PRN

## 2013-04-04 MED ORDER — CIPROFLOXACIN IN D5W 400 MG/200ML IV SOLN
400.0000 mg | INTRAVENOUS | Status: AC
  Administered 2013-04-04: 400 mg via INTRAVENOUS

## 2013-04-04 MED ORDER — ONDANSETRON HCL 4 MG/2ML IJ SOLN
INTRAMUSCULAR | Status: DC | PRN
  Administered 2013-04-04 (×2): 2 mg via INTRAVENOUS

## 2013-04-04 MED ORDER — LIDOCAINE HCL (CARDIAC) 20 MG/ML IV SOLN
INTRAVENOUS | Status: DC | PRN
  Administered 2013-04-04: 30 mg via INTRAVENOUS

## 2013-04-04 MED ORDER — CIPROFLOXACIN IN D5W 400 MG/200ML IV SOLN
INTRAVENOUS | Status: AC
  Filled 2013-04-04: qty 200

## 2013-04-04 MED ORDER — BUPIVACAINE HCL (PF) 0.25 % IJ SOLN
INTRAMUSCULAR | Status: AC
  Filled 2013-04-04: qty 30

## 2013-04-04 MED ORDER — HEPARIN SOD (PORK) LOCK FLUSH 100 UNIT/ML IV SOLN
INTRAVENOUS | Status: DC | PRN
  Administered 2013-04-04: 500 [IU] via INTRAVENOUS

## 2013-04-04 MED ORDER — LACTATED RINGERS IV SOLN: INTRAVENOUS | Status: DC

## 2013-04-04 MED ORDER — FENTANYL CITRATE 0.05 MG/ML IJ SOLN
INTRAMUSCULAR | Status: DC | PRN
  Administered 2013-04-04 (×2): 50 ug via INTRAVENOUS

## 2013-04-04 MED ORDER — HYDROCODONE-ACETAMINOPHEN 5-325 MG PO TABS: 1.0000 | ORAL_TABLET | ORAL | Status: DC | PRN

## 2013-04-04 MED ORDER — LACTATED RINGERS IV SOLN
INTRAVENOUS | Status: DC
  Administered 2013-04-04: 1000 mL via INTRAVENOUS

## 2013-04-04 MED ORDER — EPHEDRINE SULFATE 50 MG/ML IJ SOLN
INTRAMUSCULAR | Status: DC | PRN
  Administered 2013-04-04: 10 mg via INTRAVENOUS
  Administered 2013-04-04: 5 mg via INTRAVENOUS

## 2013-04-04 MED ORDER — HEPARIN SOD (PORK) LOCK FLUSH 100 UNIT/ML IV SOLN
INTRAVENOUS | Status: AC
  Filled 2013-04-04: qty 5

## 2013-04-04 MED ORDER — LACTATED RINGERS IV SOLN
INTRAVENOUS | Status: DC | PRN
  Administered 2013-04-04 (×2): via INTRAVENOUS

## 2013-04-04 MED ORDER — FENTANYL CITRATE 0.05 MG/ML IJ SOLN
INTRAMUSCULAR | Status: AC
  Filled 2013-04-04: qty 2

## 2013-04-04 MED ORDER — IOHEXOL 300 MG/ML  SOLN
INTRAMUSCULAR | Status: DC | PRN
  Administered 2013-04-04: 10 mL via INTRAVENOUS

## 2013-04-04 MED ORDER — CHLORHEXIDINE GLUCONATE 4 % EX LIQD
1.0000 "application " | Freq: Once | CUTANEOUS | Status: DC
  Filled 2013-04-04: qty 15

## 2013-04-04 MED ORDER — KETAMINE HCL 10 MG/ML IJ SOLN
INTRAMUSCULAR | Status: DC | PRN
  Administered 2013-04-04: 10 mg via INTRAVENOUS

## 2013-04-04 MED ORDER — SODIUM CHLORIDE 0.9 % IR SOLN
Freq: Once | Status: AC
Start: 2013-04-04 — End: 2013-04-04
  Administered 2013-04-04: 10:00:00
  Filled 2013-04-04: qty 1.2

## 2013-04-04 MED ORDER — PROPOFOL 10 MG/ML IV BOLUS
INTRAVENOUS | Status: DC | PRN
  Administered 2013-04-04: 150 mg via INTRAVENOUS

## 2013-04-04 SURGICAL SUPPLY — 42 items
BAG DECANTER FOR FLEXI CONT (MISCELLANEOUS) ×2 IMPLANT
BENZOIN TINCTURE PRP APPL 2/3 (GAUZE/BANDAGES/DRESSINGS) ×2 IMPLANT
BLADE HEX COATED 2.75 (ELECTRODE) ×2 IMPLANT
BLADE SURG 15 STRL LF DISP TIS (BLADE) ×1 IMPLANT
BLADE SURG 15 STRL SS (BLADE) ×1
CANISTER SUCTION 2500CC (MISCELLANEOUS) ×2 IMPLANT
CHLORAPREP W/TINT 26ML (MISCELLANEOUS) ×2 IMPLANT
CLOTH BEACON ORANGE TIMEOUT ST (SAFETY) ×2 IMPLANT
COVER SURGICAL LIGHT HANDLE (MISCELLANEOUS) ×2 IMPLANT
DECANTER SPIKE VIAL GLASS SM (MISCELLANEOUS) ×2 IMPLANT
DERMABOND ADVANCED (GAUZE/BANDAGES/DRESSINGS) ×1
DERMABOND ADVANCED .7 DNX12 (GAUZE/BANDAGES/DRESSINGS) ×1 IMPLANT
DRAPE C-ARM 42X120 X-RAY (DRAPES) ×2 IMPLANT
DRAPE LAPAROTOMY TRNSV 102X78 (DRAPE) ×2 IMPLANT
DRSG TEGADERM 4X4.75 (GAUZE/BANDAGES/DRESSINGS) ×2 IMPLANT
ELECT REM PT RETURN 9FT ADLT (ELECTROSURGICAL) ×2
ELECTRODE REM PT RTRN 9FT ADLT (ELECTROSURGICAL) ×1 IMPLANT
GAUZE SPONGE 4X4 16PLY XRAY LF (GAUZE/BANDAGES/DRESSINGS) ×2 IMPLANT
GLOVE BIOGEL PI IND STRL 7.0 (GLOVE) ×1 IMPLANT
GLOVE BIOGEL PI INDICATOR 7.0 (GLOVE) ×1
GLOVE EUDERMIC 7 POWDERFREE (GLOVE) ×4 IMPLANT
GOWN STRL NON-REIN LRG LVL3 (GOWN DISPOSABLE) ×2 IMPLANT
GOWN STRL REIN XL XLG (GOWN DISPOSABLE) ×4 IMPLANT
KIT BASIN OR (CUSTOM PROCEDURE TRAY) ×2 IMPLANT
KIT PORT POWER ISP 8FR (Catheter) IMPLANT
KIT POWER CATH 8FR (Catheter) IMPLANT
KIT POWER PORT SLIM 6FR (PORTABLE EQUIPMENT SUPPLIES) ×2 IMPLANT
NEEDLE HYPO 25X1 1.5 SAFETY (NEEDLE) ×2 IMPLANT
NS IRRIG 1000ML POUR BTL (IV SOLUTION) ×2 IMPLANT
PACK BASIC VI WITH GOWN DISP (CUSTOM PROCEDURE TRAY) ×2 IMPLANT
PENCIL BUTTON HOLSTER BLD 10FT (ELECTRODE) ×2 IMPLANT
SPONGE GAUZE 4X4 12PLY (GAUZE/BANDAGES/DRESSINGS) ×2 IMPLANT
STRIP CLOSURE SKIN 1/2X4 (GAUZE/BANDAGES/DRESSINGS) ×2 IMPLANT
SUT MNCRL AB 4-0 PS2 18 (SUTURE) ×2 IMPLANT
SUT PROLENE 2 0 SH DA (SUTURE) ×2 IMPLANT
SUT SILK 2 0 (SUTURE) ×1
SUT SILK 2-0 18XBRD TIE 12 (SUTURE) ×1 IMPLANT
SUT VIC AB 3-0 SH 18 (SUTURE) ×2 IMPLANT
SYR CONTROL 10ML LL (SYRINGE) ×2 IMPLANT
SYRINGE 10CC LL (SYRINGE) ×2 IMPLANT
TOWEL OR 17X26 10 PK STRL BLUE (TOWEL DISPOSABLE) ×2 IMPLANT
YANKAUER SUCT BULB TIP 10FT TU (MISCELLANEOUS) ×2 IMPLANT

## 2013-04-04 NOTE — Anesthesia Postprocedure Evaluation (Signed)
  Anesthesia Post-op Note  Patient: Janet [name]  Procedure(s) Performed: Procedure(s) (LRB): INSERTION PORT-A-CATH (N/A)  Patient Location: PACU  Anesthesia Type: General  Level of Consciousness: awake and alert   Airway and Oxygen Therapy: Patient Spontanous Breathing  Post-op Pain: mild  Post-op Assessment: Post-op Vital signs reviewed, Patient's Cardiovascular Status Stable, Respiratory Function Stable, Patent Airway and No signs of Nausea or vomiting  Last Vitals:  Filed Vitals:   04/04/13 1320  BP: 119/64  Pulse:   Temp:   Resp: 16    Post-op Vital Signs: stable   Complications: No apparent anesthesia complications

## 2013-04-04 NOTE — Anesthesia Preprocedure Evaluation (Addendum)
Anesthesia Evaluation  Patient identified by MRN, date of birth, ID band Patient awake    Reviewed: Allergy & Precautions, H&P , NPO status , Patient's Chart, lab work & pertinent test results  Airway Mallampati: II TM Distance: >3 FB Neck ROM: Full    Dental no notable dental hx.    Pulmonary neg pulmonary ROS,  breath sounds clear to auscultation  Pulmonary exam normal       Cardiovascular negative cardio ROS  Rhythm:Regular Rate:Normal     Neuro/Psych negative neurological ROS  negative psych ROS   GI/Hepatic negative GI ROS, Neg liver ROS,   Endo/Other  negative endocrine ROS  Renal/GU negative Renal ROS  negative genitourinary   Musculoskeletal negative musculoskeletal ROS (+)   Abdominal   Peds negative pediatric ROS (+)  Hematology negative hematology ROS (+)   Anesthesia Other Findings Upper front left cap  Reproductive/Obstetrics negative OB ROS                           Anesthesia Physical Anesthesia Plan  ASA: I  Anesthesia Plan: MAC   Post-op Pain Management:    Induction: Intravenous  Airway Management Planned: Simple Face Mask and Nasal Cannula  Additional Equipment:   Intra-op Plan:   Post-operative Plan: Extubation in OR  Informed Consent: I have reviewed the patients History and Physical, chart, labs and discussed the procedure including the risks, benefits and alternatives for the proposed anesthesia with the patient or authorized representative who has indicated his/her understanding and acceptance.   Dental advisory given  Plan Discussed with: CRNA  Anesthesia Plan Comments:         Anesthesia Quick Evaluation

## 2013-04-04 NOTE — Op Note (Signed)
Janet [name] [place] March 16, 1956 [id number] 03/28/2013  Preoperative diagnosis: PAC needed  Postoperative diagnosis: Same  Procedure: Portacath Placement  Surgeon: Currie Paris, MD, FACS  Assistant: Suann Larry, PA-S  Anesthesia: General  Clinical History and Indications: The patient is getting ready to begin chemotherapy for her cancer. She  needs a Port-A-Cath for venous access.  Description of Procedure: I have seen the patient in the holding area and confirmed the plans for the procedure as noted above. I reviewed the risks and complications again and the patient has no further questions. She wishes to proceed.   The patient was then taken to the operating room. After satisfactory general anesthesia had been obtained the upper chest and lower neck were prepped and draped as a sterile field. The timeout was done.  The left subclavian vein was entered and the guidewire threaded into the superior vena cava right atrial area under fluoroscopic guidance. An incision was then made on the anterior chest wall and a subcutaneous pocket fashioned for the port reservoir.  The port tubing was then brought through a subcutaneous tunnel from the port site to the guidewire site. The dilator and peel-away sheath were then advanced over the guidewire while monitoring this with fluoroscopy. The guidewire and dilator were removed and the tubing threaded to approximately 22 cm. The peel-away sheath was then removed. The catheter aspirated and flushed easily. Using fluoroscopy the tip was backed out into the superior vena cava right atrial junction area. It aspirated and flushed easily. The reservoir was attached and the locking mechanism engaged. That aspirated and flushed easily.  The reservoir was secured to the fascia with 2 sutures of 2-0 Prolene. A final check with fluoroscopy was done to make sure we had no kinks and good positioning of the tip of the catheter. Everything appeared to be okay. The  catheter was aspirated, flushed with dilute heparin and then concentrated aqueous heparin.  The incision was then closed with interrupted 3-0 Vicryl, and 4-0 Monocryl subcuticular with Dermabond on the skin.  There were no operative complications. Estimated blood loss was minimal. All counts were correct. The patient tolerated the procedure well.  Currie Paris, MD, FACS 04/04/2013 10:56 AM

## 2013-04-04 NOTE — Transfer of Care (Signed)
Immediate Anesthesia Transfer of Care Note  Patient: Janet [name]  Procedure(s) Performed: Procedure(s): INSERTION PORT-A-CATH (N/A)  Patient Location: PACU  Anesthesia Type:General  Level of Consciousness: awake, alert , oriented and patient cooperative  Airway & Oxygen Therapy: Patient Spontanous Breathing and Patient connected to nasal cannula oxygen  Post-op Assessment: Report given to PACU RN and Post -op Vital signs reviewed and stable  Post vital signs: stable  Complications: No apparent anesthesia complications

## 2013-04-04 NOTE — H&P (View-Only) (Signed)
Patient ID: Janet [name], female   DOB: 1955-09-17, 57 y.o.   MRN: [id number]  Chief Complaint  Patient presents with  . Breast Cancer    Right    HPI Janet [name] is a 57 y.o. female.  She recently felt a lump in her right breast in the nipple areolar area. 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 He is over 3 cm in size. She has suspicious lymph node in the right axilla but a biopsy was negative. She develops a swelling in the axilla since the biopsy as well as bruising of the breast. She has not had any other breast problems considered herself in extremely healthy. She is active, runs and works out and works Armed forces training and education officer. She is already been seen by the medical and radiation oncologist.  HPI  History reviewed. No pertinent past medical history.  Past Surgical History  Procedure Laterality Date  . Eye surgery      Family History  Problem Relation Age of Onset  . Pancreatic cancer Mother   . Breast cancer Maternal Grandmother     Social History History  Substance Use Topics  . Smoking status: Never Smoker   . Smokeless tobacco: Not on file  . Alcohol Use: Yes     Comment: occasional    Allergies  Allergen Reactions  . Penicillins     Current Outpatient Prescriptions  Medication Sig Dispense Refill  . acetaminophen (TYLENOL) 325 MG tablet Take 650 mg by mouth every 6 (six) hours as needed for pain.      Marland Kitchen CALCIUM PO Take by mouth daily.      . Multiple Vitamin (MULTI-VITAMIN DAILY PO) Take by mouth daily.       No current facility-administered medications for this visit.    Review of Systems Review of Systems  Constitutional: Negative for fever, chills and unexpected weight change.  HENT: Negative for hearing loss, congestion, sore throat, trouble swallowing and voice change.   Eyes: Negative for visual disturbance.  Respiratory: Negative for cough and wheezing.   Cardiovascular: Negative for chest  pain, palpitations and leg swelling.  Gastrointestinal: Negative for nausea, vomiting, abdominal pain, diarrhea, constipation, blood in stool, abdominal distention and anal bleeding.  Genitourinary: Negative for hematuria, vaginal bleeding and difficulty urinating.  Musculoskeletal: Negative for arthralgias.  Skin: Negative for rash and wound.  Neurological: Negative for seizures, syncope and headaches.  Hematological: Negative for adenopathy. Does not bruise/bleed easily.  Psychiatric/Behavioral: Negative for confusion.    Blood pressure 138/87, pulse 77, temperature 98.4 F (36.9 C), resp. rate 20, height 5' 7.5" (1.715 m), weight 125 lb (56.7 kg).  Physical Exam Physical Exam  Vitals reviewed. Constitutional: She is oriented to person, place, and time. She appears well-developed and well-nourished. No distress.  HENT:  Head: Normocephalic and atraumatic.  Mouth/Throat: Oropharynx is clear and moist.  Eyes: Conjunctivae and EOM are normal. Pupils are equal, round, and reactive to light. No scleral icterus.  Neck: Normal range of motion. Neck supple. No tracheal deviation present. No thyromegaly present.  Cardiovascular: Normal rate, regular rhythm, normal heart sounds and intact distal pulses.  Exam reveals no gallop and no friction rub.   No murmur heard. Pulmonary/Chest: Effort normal and breath sounds normal. No respiratory distress. She has no wheezes. She has no rales. Right breast exhibits mass. Right breast exhibits no inverted nipple, no nipple discharge, no skin change and no tenderness. Left breast exhibits no inverted nipple,  no mass, no nipple discharge, no skin change and no tenderness. Breasts are symmetrical.    Abdominal: Soft. Bowel sounds are normal. She exhibits no distension and no mass. There is no tenderness. There is no rebound and no guarding.  Musculoskeletal: Normal range of motion. She exhibits no edema and no tenderness.  Lymphadenopathy:    She has no  cervical adenopathy.    She has axillary adenopathy.       Right axillary: Pectoral adenopathy present.       Right: No supraclavicular adenopathy present.       Left: No supraclavicular adenopathy present.  Soft oblong right axillary mass - apparently developed after biopsy, likely hematoma  Neurological: She is alert and oriented to person, place, and time.  Skin: Skin is warm and dry. No rash noted. She is not diaphoretic. No erythema.  Psychiatric: She has a normal mood and affect. Her behavior is normal. Judgment and thought content normal.    Data Reviewed I reviewed the mammogram films and reports, the MRI films reports and discuss with the radiologist; I have discussed and reviewed the pathology slides for the pathologist; I discussed her situation with the medical & radiation oncologist.  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     Plan    I have explained the pathophysiology and staging of breast cancer with particular attention to her exact situation. We discussed the multidisciplinary approach to breast cancer which often includes both medical and radiation oncology consultations.  We also discussed surgical options for the treatment of breast cancer including lumpectomy and mastectomy with possible reconstructive surgery. In addition we talked about the evaluation and management of lymph nodes including a description of sentinel lymph node biopsy and axillary dissections. We reviewed potential complications and risks including bleeding, infection, numbness,  lymphedema, and the potential need for additional surgery.  She understands that for patients who are candidate for lumpectomy or mastectomy there is an equal survival rate with either technique, but a slightly higher local recurrence rate with lumpectomy. In addition she knows that a lumpectomy usually requires postoperative radiation as part of the management of the  breast cancer.  We have discussed the likely postoperative course and plans for followup.  I have given the patient some written information that reviewed all of these issues. I believe her questions are answered and that she has a good understanding of the issues.  I think she would benefit from neoadjuvant chemotherapy. She beginning chemotherapy whether she does have pre-or post surgery. If we can shrink the size of tumor I think she will have less deformity after a central partial mastectomy. Neutrexin of it might be possible to preserve the nipple and areolar complex not certain of that. I reviewed all that with the patient so that she understands that we may still end up needing to do a significant central partial mastectomy.  I've also discussed Port-A-Cath placement with her and will go ahead and schedule this.       Beadie Matsunaga J 03/27/2013, 11:55 AM

## 2013-04-04 NOTE — Interval H&P Note (Signed)
History and Physical Interval Note:  04/04/2013 9:15 AM  Janet [name]  has presented today for surgery, with the diagnosis of right breast cancer   The various methods of treatment have been discussed with the patient and family. After consideration of risks, benefits and other options for treatment, the patient has consented to  Procedure(s): INSERTION PORT-A-CATH (N/A) as a surgical intervention .  The patient's history has been reviewed, patient examined, no change in status, stable for surgery.  I have reviewed the patient's chart and labs.  Questions were answered to the patient's satisfaction.     Ettie Krontz J

## 2013-04-05 ENCOUNTER — Encounter (HOSPITAL_COMMUNITY): Payer: Self-pay | Admitting: Surgery

## 2013-04-09 ENCOUNTER — Encounter: Payer: Self-pay | Admitting: Oncology

## 2013-04-09 NOTE — Progress Notes (Signed)
Hubby left a message on voicemail about ins coverage. I called and left a mess on his voicemail that they will have to call insurance and confirm they have coverage.

## 2013-04-11 ENCOUNTER — Other Ambulatory Visit (HOSPITAL_BASED_OUTPATIENT_CLINIC_OR_DEPARTMENT_OTHER): Payer: BC Managed Care – PPO | Admitting: Lab

## 2013-04-11 ENCOUNTER — Telehealth: Payer: Self-pay | Admitting: *Deleted

## 2013-04-11 ENCOUNTER — Ambulatory Visit (HOSPITAL_BASED_OUTPATIENT_CLINIC_OR_DEPARTMENT_OTHER): Payer: BC Managed Care – PPO | Admitting: Oncology

## 2013-04-11 VITALS — BP 124/75 | HR 73 | Temp 98.6°F | Resp 18 | Ht 69.0 in | Wt 124.2 lb

## 2013-04-11 DIAGNOSIS — C50419 Malignant neoplasm of upper-outer quadrant of unspecified female breast: Secondary | ICD-10-CM

## 2013-04-11 DIAGNOSIS — C50919 Malignant neoplasm of unspecified site of unspecified female breast: Secondary | ICD-10-CM

## 2013-04-11 DIAGNOSIS — C50411 Malignant neoplasm of upper-outer quadrant of right female breast: Secondary | ICD-10-CM

## 2013-04-11 LAB — CBC WITH DIFFERENTIAL/PLATELET
BASO%: 0.5 % (ref 0.0–2.0)
Basophils Absolute: 0 10*3/uL (ref 0.0–0.1)
EOS%: 1.1 % (ref 0.0–7.0)
HGB: 13.8 g/dL (ref 11.6–15.9)
MCH: 31.9 pg (ref 25.1–34.0)
MCHC: 34.3 g/dL (ref 31.5–36.0)
MONO#: 0.5 10*3/uL (ref 0.1–0.9)
RDW: 12.6 % (ref 11.2–14.5)
WBC: 6.2 10*3/uL (ref 3.9–10.3)
lymph#: 1.5 10*3/uL (ref 0.9–3.3)

## 2013-04-11 MED ORDER — LORAZEPAM 0.5 MG PO TABS: 0.5000 mg | ORAL_TABLET | Freq: Four times a day (QID) | ORAL | Status: DC | PRN

## 2013-04-11 MED ORDER — LIDOCAINE-PRILOCAINE 2.5-2.5 % EX CREA: TOPICAL_CREAM | CUTANEOUS | Status: DC | PRN

## 2013-04-11 MED ORDER — DEXAMETHASONE 4 MG PO TABS: ORAL_TABLET | ORAL | Status: DC

## 2013-04-11 MED ORDER — PROCHLORPERAZINE MALEATE 10 MG PO TABS: 10.0000 mg | ORAL_TABLET | Freq: Four times a day (QID) | ORAL | Status: DC | PRN

## 2013-04-11 MED ORDER — ONDANSETRON HCL 8 MG PO TABS: 8.0000 mg | ORAL_TABLET | Freq: Two times a day (BID) | ORAL | Status: DC | PRN

## 2013-04-11 NOTE — Progress Notes (Signed)
Patient ID: DERINDA BARTUS, female   DOB: 20-Aug-1955, 57 y.o.   MRN: [id number] ID: [name] OB: 10/31/1955  MR#: [id number]  NWG#:[id number]  PCP: Astrid Divine, MD GYN:   SU: Cicero Duck OTHER MD: Chipper Herb, Maryelizabeth Kaufmann   HISTORY OF PRESENT ILLNESS: "[name]" herself noted a lump in her right breast on 03/17/2013, and immediately contacted her physician so that on 03/18/2013 bilateral diagnostic mammography and right breast ultrasonography at the breast Center showed a developing mass in the subareolar region of the right breast. There were no malignant type calcifications. This mass was palpable in the upper outer quadrant, and by ultrasound measured 2.6 cm. Ultrasonography of the right axilla showed a lymph node measuring 1.1 cm, with a thickened cortex. The left breast was unremarkable.  Biopsy of the right breast mass 08/26//2014 showed ([name]) and invasive ductal carcinoma, grade 3, triple negative, with an MIB-1 of 54%. Biopsy of the right axillary lymph node in question at the same time was negative.  Bilateral breast MRI 03/26/2013 measured the right subareolar mass at 3.8 cm. The previously biopsied benign right axillary lymph node was again noted. There were no other suspicious areas in the right breast or axilla and the left breast was unremarkable.  Her subsequent history is as detailed below  INTERVAL HISTORY: [name] returns today for followup of her breast cancer. Since her last visit here she had her port placed. That went without H. She also came to chemotherapy school, and "asked a lot of questions". Finally she had her echocardiogram shows an excellent ejection fraction. She is here today to discuss her upcoming chemotherapy. -- The patient was kind enough to allow a chaplain in turn to sit in through the interview today  REVIEW OF SYSTEMS: She had very minimal discomfort with reports placement and did not take any pain medication for it a detailed  review of systems today is entirely unremarkable and she continues to work full-time.   PAST MEDICAL HISTORY: Past Medical History  Diagnosis Date  . Migraines   . Cancer     breast    PAST SURGICAL HISTORY: Past Surgical History  Procedure Laterality Date  . Removal of cataracts    . Foot surgery  age 89    bone graft  . Portacath placement N/A 04/04/2013    Procedure: INSERTION PORT-A-CATH;  Surgeon: Currie Paris, MD;  Location: [facility];  Service: General;  Laterality: N/A;    FAMILY HISTORY Family History  Problem Relation Age of Onset  . Pancreatic cancer Mother   . Breast cancer Maternal Grandmother    the patient's father died at the age of 37 with pancreatic cancer. (He was Dr. Chrystine Oiler). [name] is alive at age 45. Sallye Ober has one brother, no sisters. [name]'s mother died from breast cancer at the age of 80. There is no history of ovarian cancer in the family  GYNECOLOGIC HISTORY:  Menarche age 35, first live birth age 35. She is GX P2. She stopped having periods approximately 2004. She did not take hormone replacement. She did take birth control pills remotely for 5-10 years, with no complications.  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 Her husband the [place]. Quintella Reichert "Robby" Krysiak, is  a local judge. The patient's 2 children are mildly who lives in [place] and works as a Financial risk analyst, and [place], who works in [place] and is an associate with a Set designer group.  ADVANCED DIRECTIVES: In place   HEALTH MAINTENANCE: History  Substance Use Topics  . Smoking status: Never Smoker   . Smokeless tobacco: Not on file  . Alcohol Use: Yes     Comment: occasional     Colonoscopy:  PAP:  Bone density:  Lipid  panel:  Allergies  Allergen Reactions  . Penicillins     Current Outpatient Prescriptions  Medication Sig Dispense Refill  . acetaminophen (TYLENOL) 325 MG tablet Take 650 mg by mouth every 6 (six) hours as needed for pain.      Marland Kitchen CALCIUM PO Take by mouth daily.      Marland Kitchen HYDROcodone-acetaminophen (NORCO) 5-325 MG per tablet Take 1 tablet by mouth every 4 (four) hours as needed for pain.  30 tablet  0  . Multiple Vitamin (MULTI-VITAMIN DAILY PO) Take by mouth daily.       No current facility-administered medications for this visit.    OBJECTIVE: Middle-aged white woman in no acute distress Filed Vitals:   04/11/13 1528  BP: 124/75  Pulse: 73  Temp: 98.6 F (37 C)  Resp: 18     Body mass index is 18.33 kg/(m^2).    ECOG FS:0 - Asymptomatic  (Exam) was not repeated)  (Sclerae unicteric Oropharynx clear No cervical or supraclavicular adenopathy Lungs no rales or rhonchi Heart regular rate and rhythm Abd benign MSK no focal spinal tenderness, no peripheral edema Neuro: non-focal, well-oriented, appropriate affect Breasts: The right breast mass is easily palpable under the areola around 11:00. Is movable, with no skin involvement or nipple retraction. I do not palpate a right axillary mass. The left breast is unremarkable)  Skin: The port is in the left upper anterior chest, easily palpable, with no erythema swelling or unusual tenderness   LAB RESULTS:  CMP     Component Value Date/Time   NA 142 03/27/2013 0807   K 4.3 03/27/2013 0807   CO2 24 03/27/2013 0807   GLUCOSE 87 03/27/2013 0807   BUN 16.7 03/27/2013 0807   CREATININE 0.8 03/27/2013 0807   CALCIUM 10.3 03/27/2013 0807   PROT 6.2* 03/27/2013 0807   ALBUMIN 3.6 03/27/2013 0807   AST 21 03/27/2013 0807   ALT 29 03/27/2013 0807   ALKPHOS 73 03/27/2013 0807   BILITOT 0.46 03/27/2013 0807    I No results found for this basename: SPEP,  UPEP,   kappa and lambda light chains    Lab Results  Component Value Date   WBC 6.2 04/11/2013    NEUTROABS 4.2 04/11/2013   HGB 13.8 04/11/2013   HCT 40.2 04/11/2013   MCV 93.1 04/11/2013   PLT 191 04/11/2013      Chemistry      Component Value Date/Time   NA 142 03/27/2013 0807   K 4.3 03/27/2013 0807   CO2 24 03/27/2013 0807   BUN 16.7 03/27/2013 0807   CREATININE 0.8 03/27/2013 0807      Component Value Date/Time   CALCIUM 10.3 03/27/2013 0807   ALKPHOS 73 03/27/2013 0807   AST 21 03/27/2013 0807   ALT 29 03/27/2013 0807   BILITOT 0.46 03/27/2013 0807       No results found for this basename: LABCA2    No components found with this basename: ONGEX528  No results found for this basename: INR,  in the last 168 hours  Urinalysis No results found for this basename: colorurine,  appearanceur,  labspec,  phurine,  glucoseu,  hgbur,  bilirubinur,  ketonesur,  proteinur,  urobilinogen,  nitrite,  leukocytesur    STUDIES: ------------------------------------------------------------ Transthoracic Echocardiography  Patient: Ambert, Virrueta MR #: [id number] Study Date: 04/02/2013 Gender: F Age: 43 Height: 172.7cm Weight: 54.5kg BSA: 1.55m^2 Pt. Status: Room:  PERFORMING [facility] ORDERING Khaleef Ruby, Cicero Duck REFERRING Matisse Salais, Cicero Duck REFERRING Valentina Lucks, Consuella Lose [facility] ATTENDING Renae Gloss SONOGRAPHER Nolon Rod, RDCS cc:  ------------------------------------------------------------ LV EF: 55%  ------------------------------------------------------------ Indications: Neoplasm - breast 174.9.  ------------------------------------------------------------ History: PMH: Pre-op evaluation for Chemotherapy. No prior cardiac history.  ------------------------------------------------------------ Study Conclusions  Left ventricle: The cavity size was normal. Systolic function was normal. The estimated ejection fraction was 55%. Wall motion was normal; there were no regional wall motion abnormalities. Transthoracic echocardiography. M-mode, complete  2D, spectral Doppler, and color Doppler. Height: Height: 172.7cm. Height: 68in. Weight: Weight: 54.5kg. Weight: 120lb. Body mass index: BMI: 18.3kg/m^2. Body surface area: BSA: 1.46m^2. Blood pressure: 132/87. Patient status: Outpatient. Location: Echo laboratory.     ASSESSMENT: 57 y.o. [place] woman status post right breast biopsy 03/19/2013 for a clinical T2 N0, stage IIA invasive ductal breast cancer, grade 3, triple negative, with an MIB-1 of 54%.  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 We also discussed at length the antinausea medications as she will be taking, and she was given a "map" on how to take them. I also forwarded all her prescriptions directly to her pharmacy.  Specifically, she will start dexamethasone the evening of chemotherapy and start prochlorperazine at the same time. She will continue those medications for the next 3 days, the dexamethasone at twice daily in the prochlorperazine every 6 hours or before meals and at bedtime, her choice. She will take lorazepam at bedtime on the days when she receives dexamethasone. Marland Kitchen of course she has a good understanding of how to use the numbing cream.  Her first treatment will be September 24. She will see [place] again a week later. I have gone ahead and made all her appointments early and other 4 cycles of a.c., after which we will need to discuss the weekly chemotherapy to follow. She knows to call for any problems that may develop before next visit here.   Lowella Dell, MD   04/11/2013 3:54 PM

## 2013-04-11 NOTE — Telephone Encounter (Signed)
appts made and printed. Pt is aware that her tx's will be added. i emailed MW to add the tx's...td 

## 2013-04-12 ENCOUNTER — Other Ambulatory Visit: Payer: Self-pay | Admitting: *Deleted

## 2013-04-12 NOTE — Telephone Encounter (Signed)
Message from spouse that Rx for Ativan was never called into pharmacy. Called Rx from 04/11/13 to [facility]

## 2013-04-15 ENCOUNTER — Other Ambulatory Visit: Payer: Self-pay | Admitting: Certified Registered Nurse Anesthetist

## 2013-04-17 ENCOUNTER — Other Ambulatory Visit: Payer: Self-pay | Admitting: Oncology

## 2013-04-17 ENCOUNTER — Other Ambulatory Visit (HOSPITAL_BASED_OUTPATIENT_CLINIC_OR_DEPARTMENT_OTHER): Payer: BC Managed Care – PPO

## 2013-04-17 ENCOUNTER — Ambulatory Visit (HOSPITAL_BASED_OUTPATIENT_CLINIC_OR_DEPARTMENT_OTHER): Payer: BC Managed Care – PPO

## 2013-04-17 VITALS — BP 114/66 | HR 68 | Temp 97.2°F | Resp 16

## 2013-04-17 DIAGNOSIS — C50919 Malignant neoplasm of unspecified site of unspecified female breast: Secondary | ICD-10-CM

## 2013-04-17 DIAGNOSIS — C50419 Malignant neoplasm of upper-outer quadrant of unspecified female breast: Secondary | ICD-10-CM

## 2013-04-17 DIAGNOSIS — Z5111 Encounter for antineoplastic chemotherapy: Secondary | ICD-10-CM

## 2013-04-17 DIAGNOSIS — C50411 Malignant neoplasm of upper-outer quadrant of right female breast: Secondary | ICD-10-CM

## 2013-04-17 LAB — CBC WITH DIFFERENTIAL/PLATELET
Basophils Absolute: 0 10*3/uL (ref 0.0–0.1)
Eosinophils Absolute: 0.1 10*3/uL (ref 0.0–0.5)
HGB: 14.3 g/dL (ref 11.6–15.9)
NEUT#: 3.8 10*3/uL (ref 1.5–6.5)
RDW: 12.1 % (ref 11.2–14.5)
lymph#: 1.7 10*3/uL (ref 0.9–3.3)

## 2013-04-17 MED ORDER — PALONOSETRON HCL INJECTION 0.25 MG/5ML
INTRAVENOUS | Status: AC
  Filled 2013-04-17: qty 5

## 2013-04-17 MED ORDER — SODIUM CHLORIDE 0.9 % IJ SOLN
10.0000 mL | INTRAMUSCULAR | Status: DC | PRN
  Administered 2013-04-17: 10 mL
  Filled 2013-04-17: qty 10

## 2013-04-17 MED ORDER — DEXAMETHASONE SODIUM PHOSPHATE 20 MG/5ML IJ SOLN
12.0000 mg | Freq: Once | INTRAMUSCULAR | Status: AC
  Administered 2013-04-17: 12 mg via INTRAVENOUS

## 2013-04-17 MED ORDER — HEPARIN SOD (PORK) LOCK FLUSH 100 UNIT/ML IV SOLN
500.0000 [IU] | Freq: Once | INTRAVENOUS | Status: AC | PRN
  Administered 2013-04-17: 500 [IU]
  Filled 2013-04-17: qty 5

## 2013-04-17 MED ORDER — SODIUM CHLORIDE 0.9 % IV SOLN
600.0000 mg/m2 | Freq: Once | INTRAVENOUS | Status: AC
  Administered 2013-04-17: 980 mg via INTRAVENOUS
  Filled 2013-04-17: qty 49

## 2013-04-17 MED ORDER — PALONOSETRON HCL INJECTION 0.25 MG/5ML
0.2500 mg | Freq: Once | INTRAVENOUS | Status: AC
  Administered 2013-04-17: 0.25 mg via INTRAVENOUS

## 2013-04-17 MED ORDER — SODIUM CHLORIDE 0.9 % IV SOLN
150.0000 mg | Freq: Once | INTRAVENOUS | Status: AC
  Administered 2013-04-17: 150 mg via INTRAVENOUS
  Filled 2013-04-17: qty 5

## 2013-04-17 MED ORDER — SODIUM CHLORIDE 0.9 % IV SOLN
Freq: Once | INTRAVENOUS | Status: AC
  Administered 2013-04-17: 15:00:00 via INTRAVENOUS

## 2013-04-17 MED ORDER — DOXORUBICIN HCL CHEMO IV INJECTION 2 MG/ML
60.0000 mg/m2 | Freq: Once | INTRAVENOUS | Status: AC
  Administered 2013-04-17: 98 mg via INTRAVENOUS
  Filled 2013-04-17: qty 49

## 2013-04-17 MED ORDER — DEXAMETHASONE SODIUM PHOSPHATE 20 MG/5ML IJ SOLN
INTRAMUSCULAR | Status: AC
  Filled 2013-04-17: qty 5

## 2013-04-17 NOTE — Patient Instructions (Addendum)
[facility] Discharge Instructions for Patients Receiving Chemotherapy  Today you received the following chemotherapy agents ADRIAMYCIN, CYTOXAN  To help prevent nausea and vomiting after your treatment, we encourage you to take your nausea medication AS DIRECTED BY DR Darnelle Catalan AND YOUR MEDICAL CARE TEAM   If you develop nausea and vomiting that is not controlled by your nausea medication, call the clinic.   BELOW ARE SYMPTOMS THAT SHOULD BE REPORTED IMMEDIATELY:  *FEVER GREATER THAN 100.5 F  *CHILLS WITH OR WITHOUT FEVER  NAUSEA AND VOMITING THAT IS NOT CONTROLLED WITH YOUR NAUSEA MEDICATION  *UNUSUAL SHORTNESS OF BREATH  *UNUSUAL BRUISING OR BLEEDING  TENDERNESS IN MOUTH AND THROAT WITH OR WITHOUT PRESENCE OF ULCERS  *URINARY PROBLEMS  *BOWEL PROBLEMS  UNUSUAL RASH Items with * indicate a potential emergency and should be followed up as soon as possible.  Feel free to call the clinic you have any questions or concerns. The clinic phone number is [phone].

## 2013-04-18 ENCOUNTER — Ambulatory Visit (HOSPITAL_BASED_OUTPATIENT_CLINIC_OR_DEPARTMENT_OTHER): Payer: BC Managed Care – PPO

## 2013-04-18 VITALS — BP 99/62 | HR 74 | Temp 97.3°F | Resp 20

## 2013-04-18 DIAGNOSIS — C50419 Malignant neoplasm of upper-outer quadrant of unspecified female breast: Secondary | ICD-10-CM

## 2013-04-18 DIAGNOSIS — C50411 Malignant neoplasm of upper-outer quadrant of right female breast: Secondary | ICD-10-CM

## 2013-04-18 DIAGNOSIS — Z5189 Encounter for other specified aftercare: Secondary | ICD-10-CM

## 2013-04-18 MED ORDER — PEGFILGRASTIM INJECTION 6 MG/0.6ML
6.0000 mg | Freq: Once | SUBCUTANEOUS | Status: AC
  Administered 2013-04-18: 6 mg via SUBCUTANEOUS
  Filled 2013-04-18: qty 0.6

## 2013-04-18 NOTE — Progress Notes (Signed)
Chemo follow up: Pt states she tolerated well. Denies nausea or vomiting. No questions at this time

## 2013-04-19 ENCOUNTER — Telehealth: Payer: Self-pay | Admitting: Oncology

## 2013-04-19 NOTE — Telephone Encounter (Signed)
Talked to pt and cancelled injection on october per pt rqst

## 2013-04-23 ENCOUNTER — Ambulatory Visit (HOSPITAL_BASED_OUTPATIENT_CLINIC_OR_DEPARTMENT_OTHER): Payer: BC Managed Care – PPO | Admitting: Oncology

## 2013-04-23 ENCOUNTER — Telehealth: Payer: Self-pay | Admitting: Physician Assistant

## 2013-04-23 ENCOUNTER — Other Ambulatory Visit (HOSPITAL_BASED_OUTPATIENT_CLINIC_OR_DEPARTMENT_OTHER): Payer: BC Managed Care – PPO | Admitting: Lab

## 2013-04-23 VITALS — BP 112/71 | HR 70 | Temp 98.2°F | Resp 20 | Ht 69.0 in | Wt 124.6 lb

## 2013-04-23 DIAGNOSIS — C50919 Malignant neoplasm of unspecified site of unspecified female breast: Secondary | ICD-10-CM

## 2013-04-23 DIAGNOSIS — C50419 Malignant neoplasm of upper-outer quadrant of unspecified female breast: Secondary | ICD-10-CM

## 2013-04-23 DIAGNOSIS — Z171 Estrogen receptor negative status [ER-]: Secondary | ICD-10-CM

## 2013-04-23 DIAGNOSIS — C50411 Malignant neoplasm of upper-outer quadrant of right female breast: Secondary | ICD-10-CM

## 2013-04-23 LAB — CBC WITH DIFFERENTIAL/PLATELET
Eosinophils Absolute: 0.1 10*3/uL (ref 0.0–0.5)
HCT: 37.7 % (ref 34.8–46.6)
HGB: 12.6 g/dL (ref 11.6–15.9)
LYMPH%: 29.7 % (ref 14.0–49.7)
MONO#: 0.4 10*3/uL (ref 0.1–0.9)
NEUT#: 2.3 10*3/uL (ref 1.5–6.5)
NEUT%: 58.3 % (ref 38.4–76.8)
Platelets: 120 10*3/uL — ABNORMAL LOW (ref 145–400)
WBC: 3.9 10*3/uL (ref 3.9–10.3)

## 2013-04-23 MED ORDER — POLYETHYLENE GLYCOL 3350 17 GM/SCOOP PO POWD: 17.0000 g | Freq: Every day | ORAL | Status: DC

## 2013-04-23 MED ORDER — SUMATRIPTAN SUCCINATE 50 MG PO TABS: 50.0000 mg | ORAL_TABLET | ORAL | Status: DC | PRN

## 2013-04-23 NOTE — Progress Notes (Signed)
Patient ID: Janet [name], female   DOB: [birth year], 57 y.o.   MRN: [id number] ID: Janet [name] OB: Oct 13, 1955  MR#: [id number]  NWG#:[id number]  PCP: Janet Divine, MD GYN:   SU: Cicero Duck OTHER MD: Janet [name], Janet [name]   HISTORY OF PRESENT ILLNESS: "Janet [name]" herself noted a lump in her right breast on 03/17/2013, and immediately contacted her physician so that on 03/18/2013 bilateral diagnostic mammography and right breast ultrasonography at the breast Center showed a developing mass in the subareolar region of the right breast. There were no malignant type calcifications. This mass was palpable in the upper outer quadrant, and by ultrasound measured 2.6 cm. Ultrasonography of the right axilla showed a lymph node measuring 1.1 cm, with a thickened cortex. The left breast was unremarkable.  Biopsy of the right breast mass 08/26//2014 showed ([name]) and invasive ductal carcinoma, grade 3, triple negative, with an MIB-1 of 54%. Biopsy of the right axillary lymph node in question at the same time was negative.  Bilateral breast MRI 03/26/2013 measured the right subareolar mass at 3.8 cm. The previously biopsied benign right axillary lymph node was again noted. There were no other suspicious areas in the right breast or axilla and the left breast was unremarkable.  Her subsequent history is as detailed below  INTERVAL HISTORY: Janet [name] returns today for followup of her breast cancer. Today is day 7 cycle one of her first of 4 planned cycles of dose dense doxorubicin/cyclophosphamide  REVIEW OF SYSTEMS: She did remarkably well with chemotherapy for the first 5 days and in particular if tolerated the Neulasta without any significant aches or pains. She had occasional burps but no nausea or vomiting. She does have taste perversion and "my mouth isn't as great". 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 "This was not a good day". She felt like she had "wet concrete over my brain". Nevertheless she goes through the day and today she feels "terrific. She was constipated beginning the day after chemotherapy and only resolved yesterday. 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 A detailed review of systems today was otherwise noncontributory   PAST MEDICAL HISTORY: Past Medical History  Diagnosis Date  . Migraines   . Cancer     breast    PAST SURGICAL HISTORY: Past Surgical History  Procedure Laterality Date  . Removal of cataracts    . Foot surgery  age 33    bone graft  . Portacath placement N/A 04/04/2013    Procedure: INSERTION PORT-A-CATH;  Surgeon: Janet Paris, MD;  Location: [facility];  Service: General;  Laterality: N/A;    FAMILY HISTORY Family History  Problem Relation Age of Onset  . Pancreatic cancer Mother   . Breast cancer Maternal Grandmother    the patient's father died at the age of 52 with pancreatic cancer. (He was Dr. Chrystine [name]). [name] is alive at age 22. Janet [name] has one brother, no sisters. [name]'s mother died from breast cancer at the age of 56. There is no history of ovarian cancer in the family  GYNECOLOGIC HISTORY:  Menarche age 14, first live birth age 74. She is GX P2. She stopped having periods approximately 2004. She did not take hormone replacement. She did take birth control pills remotely  for 5-10 years, with no complications.  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 Her husband the [place]. Janet [name] "Janet" [name], is a local judge. The patient's 2 children are mildly who lives in [place] and works as a  Financial risk analyst, and [place], who works in [place] and is an associate with a Set designer group.  ADVANCED DIRECTIVES: In place   HEALTH MAINTENANCE: History  Substance Use Topics  . Smoking status: Never Smoker   . Smokeless tobacco: Not on file  . Alcohol Use: Yes     Comment: occasional     Colonoscopy:  PAP:  Bone density:  Lipid panel:  Allergies  Allergen Reactions  . Penicillins     Current Outpatient Prescriptions  Medication Sig Dispense Refill  . acetaminophen (TYLENOL) 325 MG tablet Take 650 mg by mouth every 6 (six) hours as needed for pain.      Marland Kitchen CALCIUM PO Take by mouth daily.      Marland Kitchen dexamethasone (DECADRON) 4 MG tablet Take 2 tablets by mouth once a day on the day after chemotherapy and then take 2 tablets two times a day for 2 days. Take with food.  30 tablet  1  . HYDROcodone-acetaminophen (NORCO) 5-325 MG per tablet Take 1 tablet by mouth every 4 (four) hours as needed for pain.  30 tablet  0  . lidocaine-prilocaine (EMLA) cream Apply topically as needed.  30 g  0  . LORazepam (ATIVAN) 0.5 MG tablet Take 1 tablet (0.5 mg total) by mouth every 6 (six) hours as needed (Nausea or vomiting).  30 tablet  0  . Multiple Vitamin (MULTI-VITAMIN DAILY PO) Take by mouth daily.      . ondansetron (ZOFRAN) 8 MG tablet Take 1 tablet (8 mg total) by mouth 2 (two) times daily as needed. Take two times a day as needed for nausea or vomiting starting on the third day after chemotherapy.  30 tablet  1  . prochlorperazine (COMPAZINE) 10 MG tablet Take 1 tablet (10 mg total) by mouth every 6 (six) hours as needed (Nausea or vomiting).  30 tablet  1   No current facility-administered medications for this visit.    OBJECTIVE: Middle-aged white woman  who appears well  Filed Vitals:   04/23/13 1437  BP: 112/71  Pulse: 70  Temp: 98.2 F (36.8 C)  Resp: 20     Body mass index is 18.39 kg/(m^2).    ECOG FS:1 - Symptomatic but completely ambulatory   Sclerae  unicteric Oropharynx clear No cervical or supraclavicular adenopathy Lungs no rales or rhonchi Heart regular rate and rhythm Abd benign MSK no focal spinal tenderness, no peripheral edema Neuro: nonfocal Breasts: The mass in the right breast seems to me softer already, with less defined edges. The right axilla is benign. The left breast was unremarkable  LAB RESULTS:  CMP     Component Value Date/Time   NA 142 03/27/2013 0807   K 4.3 03/27/2013 0807   CO2 24 03/27/2013 0807   GLUCOSE 87 03/27/2013 0807   BUN 16.7 03/27/2013 0807   CREATININE 0.8 03/27/2013 0807   CALCIUM 10.3 03/27/2013 0807   PROT 6.2* 03/27/2013 0807   ALBUMIN 3.6 03/27/2013 0807   AST 21 03/27/2013 0807  ALT 29 03/27/2013 0807   ALKPHOS 73 03/27/2013 0807   BILITOT 0.46 03/27/2013 0807    I No results found for this basename: SPEP,  UPEP,   kappa and lambda light chains    Lab Results  Component Value Date   WBC 3.9 04/23/2013   NEUTROABS 2.3 04/23/2013   HGB 12.6 04/23/2013   HCT 37.7 04/23/2013   MCV 94.3 04/23/2013   PLT 120* 04/23/2013      Chemistry      Component Value Date/Time   NA 142 03/27/2013 0807   K 4.3 03/27/2013 0807   CO2 24 03/27/2013 0807   BUN 16.7 03/27/2013 0807   CREATININE 0.8 03/27/2013 0807      Component Value Date/Time   CALCIUM 10.3 03/27/2013 0807   ALKPHOS 73 03/27/2013 0807   AST 21 03/27/2013 0807   ALT 29 03/27/2013 0807   BILITOT 0.46 03/27/2013 0807       No results found for this basename: LABCA2    No components found with this basename: LABCA125    No results found for this basename: INR,  in the last 168 hours  Urinalysis No results found for this basename: colorurine,  appearanceur,  labspec,  phurine,  glucoseu,  hgbur,  bilirubinur,  ketonesur,  proteinur,  urobilinogen,  nitrite,  leukocytesur    STUDIES: Mr Breast Bilateral W Wo Contrast  03/26/2013   *RADIOLOGY REPORT*  Clinical Data:New diagnosis of right breast cancer.  LABS  BILATERAL BREAST MRI WITH AND WITHOUT CONTRAST   Technique: Multiplanar, multisequence MR images of both breasts were obtained prior to and following the intravenous administration of 11ml of Multihance.  THREE-DIMENSIONAL MR IMAGE RENDERING ON INDEPENDENT WORKSTATION: Three-dimensional MR images were rendered by post-processing  of the original MR data on an independent DynaCad workstation. The three-dimensional MR images were interpreted, and findings are reported in the following complete MRI report for this study.  Comparison:  Mammogram from the [facility] 03/19/2013 and earlier  FINDINGS:  Breast composition:  d:  The breast tissue is extremely dense, which lowers the sensitivity of mammography.  Background parenchymal enhancement: Moderate  Right breast:  Within the medial subareolar region of the right breast, there is an enhancing mass which measures 3.8 x 2.9 x 2.5 cm.  This demonstrates fast wash-in/washout type enhancement kinetics and contains a small tissue void artifact from recent biopsy clip placed at the time of biopsy under ultrasound guidance. Biopsy showed invasive ductal carcinoma.  Elsewhere within the right breast, there are scattered foci of enhancement been no suspicious enhancement identified.  Left breast:  Scattered foci of enhancement.  No suspicious enhancement identified.  Lymph nodes:  A single right axillary lymph node shows thickened cortex.  Biopsy showed no malignancy.There is right axillary edema following ultrasound-guided core biopsy.The left axilla is negative for suspicious lymph nodes.  Ancillary findings:  None.  IMPRESSION:  1.  Solitary subareolar mass in the right breast, consistent with known malignancy. 2.  Edema and prominent right axillary lymph node, negative by biopsy.  RECOMMENDATION:  Treatment plan.  BI-RADS CATEGORY 6:  Known biopsy-proven malignancy - appropriate action should be taken.   Original Report Authenticated By: Norva Pavlov, M.D.   Dg Chest Port 1 View  04/04/2013    CLINICAL DATA:  Port-A-Cath placement  EXAM: PORTABLE CHEST - 1 VIEW  COMPARISON:  None.  FINDINGS: A left anterior chest wall Port-A-Cath has is catheter extend to the left subclavian vein. The tip lies  in the mid superior vena cava. There is no pneumothorax.  The heart, mediastinum and hila are within normal limits.  There is fine interstitial thickening in the lungs with additional reticular scarring at the apices. The lungs are otherwise clear. No pleural effusion.  The bony thorax is intact.  IMPRESSION: 1. Left anterior chest wall Port a Catheter has the catheter tip in the mid superior vena cava. No pneumothorax. 2. No acute cardiopulmonary disease.   Electronically Signed   By: Amie Portland   On: 04/04/2013 12:26   Dg C-arm 1-60 Min-no Report  04/04/2013   CLINICAL DATA: port placement   C-ARM 1-60 MINUTES  Fluoroscopy was utilized by the requesting physician.  No radiographic  interpretation.     ASSESSMENT: 57 y.o.  [place] woman status post right breast biopsy 03/19/2013 for a clinical T2 N0, stage IIA invasive ductal breast cancer, grade 3, triple negative, with an MIB-1 of 54%.  (1) suspicious right axillary lymph node biopsy negative  (2) started dose dense doxorubicin/ cyclophosphamide 04/17/2013, at standard doses, with Neulasta support; 4 cycles planned to be followed by 12 weekly doses of carboplatin/paclitaxel  PLAN: [name] tolerated her first cycle of chemotherapy remarkably well, but I think we can do better. Because the patient issues should be easily resolved: she will start taking MiraLAX the day after chemotherapy and continue at least a week or in definitely if she wishes.  I have written her for sumatriptan (Imitrex) to take at the first sign of developing a migraine. She understands she may repeat this after 2 hours if the initial dose is not effective. 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 I think would be simple changes she is going to be able to get through her heavy teaching day without so much stress.  We went over her appointments and there were 2 chemotherapy appointments missing which were added. I also added a followup appointment on November 5. Janet [name] has a very good understanding of the overall plan and will call for any problems that may develop before her next visit here.     Lowella Dell, MD   04/23/2013 3:19 PM

## 2013-04-24 ENCOUNTER — Encounter: Payer: Self-pay | Admitting: Physician Assistant

## 2013-04-24 ENCOUNTER — Telehealth: Payer: Self-pay | Admitting: *Deleted

## 2013-04-24 NOTE — Telephone Encounter (Signed)
Per staff message and POF I have scheduled appts.  JMW  

## 2013-04-25 ENCOUNTER — Telehealth: Payer: Self-pay | Admitting: Oncology

## 2013-04-25 ENCOUNTER — Telehealth: Payer: Self-pay | Admitting: *Deleted

## 2013-04-25 NOTE — Telephone Encounter (Signed)
Per staff message and POF I have scheduled appts.  JMW  

## 2013-04-26 ENCOUNTER — Ambulatory Visit (INDEPENDENT_AMBULATORY_CARE_PROVIDER_SITE_OTHER): Payer: BC Managed Care – PPO | Admitting: Surgery

## 2013-04-26 ENCOUNTER — Encounter (INDEPENDENT_AMBULATORY_CARE_PROVIDER_SITE_OTHER): Payer: Self-pay | Admitting: Surgery

## 2013-04-26 VITALS — BP 110/68 | HR 72 | Temp 97.1°F | Resp 14 | Ht 68.5 in | Wt 121.2 lb

## 2013-04-26 DIAGNOSIS — Z09 Encounter for follow-up examination after completed treatment for conditions other than malignant neoplasm: Secondary | ICD-10-CM

## 2013-04-26 NOTE — Progress Notes (Signed)
She comes in for postoperative visit following Port-A-Cath placement. She is doing well. Her chemotherapy well. Her port site is healing nicely. No evidence of infection.  Impression: Doing well status post Port-A-Cath for neoadjuvant chemotherapy  Plan: She'll followup here in January with Dr. Ovidio Kin so that she may schedule surgery in February at the completion of her neoadjuvant chemotherapy

## 2013-04-26 NOTE — Patient Instructions (Signed)
See Dr Ezzard Standing in January to schedule surgery

## 2013-04-29 ENCOUNTER — Other Ambulatory Visit: Payer: Self-pay | Admitting: Physician Assistant

## 2013-04-30 ENCOUNTER — Encounter: Payer: Self-pay | Admitting: Physician Assistant

## 2013-04-30 ENCOUNTER — Telehealth: Payer: Self-pay | Admitting: *Deleted

## 2013-04-30 ENCOUNTER — Other Ambulatory Visit (HOSPITAL_BASED_OUTPATIENT_CLINIC_OR_DEPARTMENT_OTHER): Payer: BC Managed Care – PPO | Admitting: Lab

## 2013-04-30 ENCOUNTER — Ambulatory Visit (HOSPITAL_BASED_OUTPATIENT_CLINIC_OR_DEPARTMENT_OTHER): Payer: BC Managed Care – PPO | Admitting: Physician Assistant

## 2013-04-30 VITALS — BP 104/64 | HR 74 | Temp 98.6°F | Resp 18 | Ht 68.0 in | Wt 123.2 lb

## 2013-04-30 DIAGNOSIS — C50919 Malignant neoplasm of unspecified site of unspecified female breast: Secondary | ICD-10-CM

## 2013-04-30 DIAGNOSIS — C50411 Malignant neoplasm of upper-outer quadrant of right female breast: Secondary | ICD-10-CM

## 2013-04-30 DIAGNOSIS — Z171 Estrogen receptor negative status [ER-]: Secondary | ICD-10-CM

## 2013-04-30 DIAGNOSIS — C50119 Malignant neoplasm of central portion of unspecified female breast: Secondary | ICD-10-CM

## 2013-04-30 DIAGNOSIS — C50419 Malignant neoplasm of upper-outer quadrant of unspecified female breast: Secondary | ICD-10-CM

## 2013-04-30 LAB — CBC WITH DIFFERENTIAL/PLATELET
BASO%: 0.6 % (ref 0.0–2.0)
EOS%: 0.2 % (ref 0.0–7.0)
HGB: 13.3 g/dL (ref 11.6–15.9)
MCH: 31.2 pg (ref 25.1–34.0)
MCHC: 33.3 g/dL (ref 31.5–36.0)
RBC: 4.27 10*6/uL (ref 3.70–5.45)
RDW: 12.3 % (ref 11.2–14.5)
lymph#: 2 10*3/uL (ref 0.9–3.3)

## 2013-04-30 NOTE — Telephone Encounter (Signed)
appts made and printed . Pt is aware that the tx will be moved. i emailed MW to move the tx...td

## 2013-04-30 NOTE — Progress Notes (Signed)
Patient ID: Janet [name], female   DOB: 05-24-1956, 57 y.o.   MRN: [id number] ID: Janet [name] OB: 1956/05/06  MR#: [id number]  YQM#:[id number]  PCP: Janet Divine, MD GYN:   SU: Cicero Duck OTHER MD: Janet [name], Janet [name]  CHIEF COMPLAINT:  Right Breast Cancer   HISTORY OF PRESENT ILLNESS: "Janet [name]" herself noted a lump in her right breast on 03/17/2013, and immediately contacted her physician so that on 03/18/2013 bilateral diagnostic mammography and right breast ultrasonography at the breast Center showed a developing mass in the subareolar region of the right breast. There were no malignant type calcifications. This mass was palpable in the upper outer quadrant, and by ultrasound measured 2.6 cm. Ultrasonography of the right axilla showed a lymph node measuring 1.1 cm, with a thickened cortex. The left breast was unremarkable.  Biopsy of the right breast mass 08/26//2014 showed ([name]) and invasive ductal carcinoma, grade 3, triple negative, with an MIB-1 of 54%. Biopsy of the right axillary lymph node in question at the same time was negative.  Bilateral breast MRI 03/26/2013 measured the right subareolar mass at 3.8 cm. The previously biopsied benign right axillary lymph node was again noted. There were no other suspicious areas in the right breast or axilla and the left breast was unremarkable.  Her subsequent history is as detailed below  INTERVAL HISTORY: Janet [name] returns today for followup of her locally advanced right breast cancer. She scheduled for day 1 cycle 2 of 4 planned dose dense cycles of doxorubicin/cyclophosphamide tomorrow, October 8. She is being treated in the neoadjuvant setting. She receives Neulasta on day 2 for granulocyte support.  Overall, Janet [name] is tolerated her first cycle of chemotherapy extremely well and has very few complaints today. She feels like she recovered readily from cycle 1. She continues to be extremely busy, runs  approximately 3 miles every day, and is working full-time.   REVIEW OF SYSTEMS: Janet [name] denies any fevers or chills. She's had no skin changes, rashes, abnormal bruising, or bleeding. Her energy level is good. She's eating and drinking well denies any problems with nausea, emesis, or change in bowel or bladder habits. She's had no increased cough and denies shortness of breath, chest pain, or palpitations. She has occasional headaches which are not new for her, and these are stable. She has Imitrex to take as needed. She's had no dizziness or change in vision. She also denies any pain whatsoever, no myalgias, arthralgias, or bony pain. She's had no peripheral swelling.  A detailed review of systems is otherwise stable and noncontributory.   PAST MEDICAL HISTORY: Past Medical History  Diagnosis Date  . Migraines   . Cancer     breast    PAST SURGICAL HISTORY: Past Surgical History  Procedure Laterality Date  . Removal of cataracts    . Foot surgery  age 61    bone graft  . Portacath placement N/A 04/04/2013    Procedure: INSERTION PORT-A-CATH;  Surgeon: Currie Paris, MD;  Location: [facility];  Service: General;  Laterality: N/A;    FAMILY HISTORY Family History  Problem Relation Age of Onset  . Pancreatic cancer Mother   . Breast cancer Maternal Grandmother    the patient's father died at the age of 37 with pancreatic cancer. (He was Dr. Chrystine [name]). [name] is alive at age 19. Janet [name] has one brother, no sisters. [name]'s mother died from breast cancer at the age of 24. There is no history  of ovarian cancer in the family  GYNECOLOGIC HISTORY:  Menarche age 18, first live birth age 23. She is GX P2. She stopped having periods approximately 2004. She did not take hormone replacement. She did take birth control pills remotely for 5-10 years, with no complications.  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 Her husband the [place]. [place] Hockey "Janet" [name], is a local judge. The patient's 2 children are Janet [name] who lives in [place] and works as a Financial risk analyst, and Janet [name], who works in [place] and is an associate with a Set designer group.  ADVANCED DIRECTIVES: In place   HEALTH MAINTENANCE: (Updated 04/30/2013) History  Substance Use Topics  . Smoking status: Never Smoker   . Smokeless tobacco: Never Used  . Alcohol Use: Yes     Comment: occasional     Colonoscopy:  PAP:  Bone density: July 2013 at [facility],    osteopenia  Lipid panel: UTD, Dr. Valentina [name]  Allergies  Allergen Reactions  . Penicillins     Current Outpatient Prescriptions  Medication Sig Dispense Refill  . acetaminophen (TYLENOL) 325 MG tablet Take 650 mg by mouth every 6 (six) hours as needed for pain.      Marland Kitchen CALCIUM PO Take by mouth daily.      Marland Kitchen dexamethasone (DECADRON) 4 MG tablet Take 2 tablets by mouth once a day on the day after chemotherapy and then take 2 tablets two times a day for 2 days. Take with food.  30 tablet  1  . HYDROcodone-acetaminophen (NORCO) 5-325 MG per tablet Take 1 tablet by mouth every 4 (four) hours as needed for pain.  30 tablet  0  . lidocaine-prilocaine (EMLA) cream Apply topically as needed.  30 g  0  . LORazepam (ATIVAN) 0.5 MG tablet Take 1 tablet (0.5 mg total) by mouth every 6 (six) hours as needed (Nausea or vomiting).  30 tablet  0  . Multiple Vitamin (MULTI-VITAMIN DAILY PO) Take by mouth daily.      . ondansetron (ZOFRAN) 8 MG tablet Take 1 tablet (8 mg total) by mouth 2 (two) times daily as needed. Take two times a day as needed for nausea or vomiting starting on the third day after chemotherapy.  30 tablet  1  . polyethylene glycol powder (MIRALAX) powder Take 17 g by mouth daily.  255 g  0  .  prochlorperazine (COMPAZINE) 10 MG tablet Take 1 tablet (10 mg total) by mouth every 6 (six) hours as needed (Nausea or vomiting).  30 tablet  1  . SUMAtriptan (IMITREX) 50 MG tablet Take 1 tablet (50 mg total) by mouth every 2 (two) hours as needed for migraine. May repeat in 2 hours if headache persists or recurs.  10 tablet  0   No current facility-administered medications for this visit.    OBJECTIVE: Middle-aged white woman  who appears well and is in no acute distress Filed Vitals:   04/30/13 1427  BP: 104/64  Pulse: 74  Temp: 98.6 F (37 C)  Resp: 18     Body mass index is 18.74 kg/(m^2).    ECOG FS:1 - Symptomatic but completely ambulatory Filed Weights   04/30/13 1427  Weight: 123 lb 3.2 oz (55.883 kg)   Physical Exam: HEENT:  Sclerae anicteric.  Oropharynx clear. No ulcerations or evidence of candidiasis. NODES:  No cervical or supraclavicular lymphadenopathy palpated.  BREAST EXAM:  Deferred. Axillae are benign bilaterally with no palpable adenopathy LUNGS:  Clear to auscultation bilaterally.  No wheezes or rhonchi HEART:  Regular rate and rhythm. No murmur appreciated ABDOMEN:  Soft, nontender.  Positive bowel sounds.  MSK:  No focal spinal tenderness to palpation.  EXTREMITIES:  No peripheral edema.   NEURO:  Nonfocal. Well oriented.  Positive affect.   LAB RESULTS:  CMP     Component Value Date/Time   NA 142 03/27/2013 0807   K 4.3 03/27/2013 0807   CO2 24 03/27/2013 0807   GLUCOSE 87 03/27/2013 0807   BUN 16.7 03/27/2013 0807   CREATININE 0.8 03/27/2013 0807   CALCIUM 10.3 03/27/2013 0807   PROT 6.2* 03/27/2013 0807   ALBUMIN 3.6 03/27/2013 0807   AST 21 03/27/2013 0807   ALT 29 03/27/2013 0807   ALKPHOS 73 03/27/2013 0807   BILITOT 0.46 03/27/2013 0807     Lab Results  Component Value Date   WBC 12.2* 04/30/2013   NEUTROABS 9.4* 04/30/2013   HGB 13.3 04/30/2013   HCT 39.9 04/30/2013   MCV 93.5 04/30/2013   PLT 160 04/30/2013      STUDIES:   No recent  studies.    ASSESSMENT: 57 y.o.  woman   (1)  status post right breast biopsy 03/19/2013 for a clinical T2 N0, stage IIA invasive ductal breast cancer, grade 3, triple negative, with an MIB-1 of 54%.  (2) suspicious right axillary lymph node biopsy negative  (3) started dose dense doxorubicin/ cyclophosphamide 04/17/2013, at standard doses, with Neulasta support; 4 cycles planned to be followed by 12 weekly doses of carboplatin/paclitaxel prior to definitive surgery   PLAN: Janet [name] will return tomorrow (05/01/2013) as scheduled for day 1 cycle 2 of dose dense doxorubicin/cyclophosphamide. She is already scheduled for her Neulasta injection on day 2, October 9. I will see her again next week on October 14 for assessment of chemotoxicity.  We again reviewed her antinausea regimen. Per Dr. Darrall Dears previous discussion, she will take 2 mg of dexamethasone on Monday mornings since Monday is her busiest today, and that seems to be when she feels the most fatigued.  We also reviewed her schedule, and are trying to make as many appointments as possible after 2:00 in the afternoon to accommodate her busy work schedule. All this was reviewed in detail with Janet [name] today. She voices understanding and agreement with our plan, and knows to call with any changes or problems prior to her next appointment.   Deyra Perdomo, PA-C   04/30/2013 3:14 PM

## 2013-04-30 NOTE — Telephone Encounter (Signed)
Per staff message and POF I have scheduled appts.  JMW  

## 2013-05-01 ENCOUNTER — Ambulatory Visit (HOSPITAL_BASED_OUTPATIENT_CLINIC_OR_DEPARTMENT_OTHER): Payer: BC Managed Care – PPO

## 2013-05-01 VITALS — BP 93/61 | HR 83 | Temp 96.8°F | Resp 18

## 2013-05-01 DIAGNOSIS — C50411 Malignant neoplasm of upper-outer quadrant of right female breast: Secondary | ICD-10-CM

## 2013-05-01 DIAGNOSIS — C50419 Malignant neoplasm of upper-outer quadrant of unspecified female breast: Secondary | ICD-10-CM

## 2013-05-01 DIAGNOSIS — Z5111 Encounter for antineoplastic chemotherapy: Secondary | ICD-10-CM

## 2013-05-01 MED ORDER — PALONOSETRON HCL INJECTION 0.25 MG/5ML
0.2500 mg | Freq: Once | INTRAVENOUS | Status: AC
  Administered 2013-05-01: 0.25 mg via INTRAVENOUS

## 2013-05-01 MED ORDER — DOXORUBICIN HCL CHEMO IV INJECTION 2 MG/ML
60.0000 mg/m2 | Freq: Once | INTRAVENOUS | Status: AC
  Administered 2013-05-01: 98 mg via INTRAVENOUS
  Filled 2013-05-01: qty 49

## 2013-05-01 MED ORDER — DEXAMETHASONE SODIUM PHOSPHATE 20 MG/5ML IJ SOLN
12.0000 mg | Freq: Once | INTRAMUSCULAR | Status: AC
  Administered 2013-05-01: 12 mg via INTRAVENOUS

## 2013-05-01 MED ORDER — HEPARIN SOD (PORK) LOCK FLUSH 100 UNIT/ML IV SOLN
500.0000 [IU] | Freq: Once | INTRAVENOUS | Status: AC | PRN
  Administered 2013-05-01: 500 [IU]
  Filled 2013-05-01: qty 5

## 2013-05-01 MED ORDER — PALONOSETRON HCL INJECTION 0.25 MG/5ML
INTRAVENOUS | Status: AC
  Filled 2013-05-01: qty 5

## 2013-05-01 MED ORDER — SODIUM CHLORIDE 0.9 % IV SOLN
Freq: Once | INTRAVENOUS | Status: AC
  Administered 2013-05-01: 15:00:00 via INTRAVENOUS

## 2013-05-01 MED ORDER — SODIUM CHLORIDE 0.9 % IJ SOLN
10.0000 mL | INTRAMUSCULAR | Status: DC | PRN
  Administered 2013-05-01: 10 mL
  Filled 2013-05-01: qty 10

## 2013-05-01 MED ORDER — CYCLOPHOSPHAMIDE CHEMO INJECTION 1 GM
600.0000 mg/m2 | Freq: Once | INTRAMUSCULAR | Status: AC
  Administered 2013-05-01: 980 mg via INTRAVENOUS
  Filled 2013-05-01: qty 49

## 2013-05-01 MED ORDER — DEXAMETHASONE SODIUM PHOSPHATE 20 MG/5ML IJ SOLN
INTRAMUSCULAR | Status: AC
  Filled 2013-05-01: qty 5

## 2013-05-01 MED ORDER — SODIUM CHLORIDE 0.9 % IV SOLN
150.0000 mg | Freq: Once | INTRAVENOUS | Status: AC
  Administered 2013-05-01: 150 mg via INTRAVENOUS
  Filled 2013-05-01: qty 5

## 2013-05-01 NOTE — Patient Instructions (Signed)
[facility] Discharge Instructions for Patients Receiving Chemotherapy  Today you received the following chemotherapy agents cytoxan and adriamycin. To help prevent nausea and vomiting after your treatment, we encourage you to take your nausea medication zofran and compazine.   If you develop nausea and vomiting that is not controlled by your nausea medication, call the clinic.   BELOW ARE SYMPTOMS THAT SHOULD BE REPORTED IMMEDIATELY:  *FEVER GREATER THAN 100.5 F  *CHILLS WITH OR WITHOUT FEVER  NAUSEA AND VOMITING THAT IS NOT CONTROLLED WITH YOUR NAUSEA MEDICATION  *UNUSUAL SHORTNESS OF BREATH  *UNUSUAL BRUISING OR BLEEDING  TENDERNESS IN MOUTH AND THROAT WITH OR WITHOUT PRESENCE OF ULCERS  *URINARY PROBLEMS  *BOWEL PROBLEMS  UNUSUAL RASH Items with * indicate a potential emergency and should be followed up as soon as possible.  Feel free to call the clinic you have any questions or concerns. The clinic phone number is [phone].

## 2013-05-02 ENCOUNTER — Other Ambulatory Visit (HOSPITAL_COMMUNITY)
Admission: RE | Admit: 2013-05-02 | Discharge: 2013-05-02 | Disposition: A | Discharge status: Home / Self Care | Payer: BC Managed Care – PPO | Source: Ambulatory Visit | Attending: Family Medicine | Admitting: Family Medicine

## 2013-05-02 ENCOUNTER — Telehealth: Payer: Self-pay | Admitting: Oncology

## 2013-05-02 ENCOUNTER — Other Ambulatory Visit: Payer: Self-pay | Admitting: Family Medicine

## 2013-05-02 ENCOUNTER — Ambulatory Visit (HOSPITAL_BASED_OUTPATIENT_CLINIC_OR_DEPARTMENT_OTHER): Payer: BC Managed Care – PPO

## 2013-05-02 VITALS — BP 126/65 | HR 77 | Temp 98.0°F

## 2013-05-02 DIAGNOSIS — C50411 Malignant neoplasm of upper-outer quadrant of right female breast: Secondary | ICD-10-CM

## 2013-05-02 DIAGNOSIS — Z124 Encounter for screening for malignant neoplasm of cervix: Secondary | ICD-10-CM | POA: Insufficient documentation

## 2013-05-02 DIAGNOSIS — C50419 Malignant neoplasm of upper-outer quadrant of unspecified female breast: Secondary | ICD-10-CM

## 2013-05-02 DIAGNOSIS — Z5189 Encounter for other specified aftercare: Secondary | ICD-10-CM

## 2013-05-02 MED ORDER — PEGFILGRASTIM INJECTION 6 MG/0.6ML
6.0000 mg | Freq: Once | SUBCUTANEOUS | Status: AC
  Administered 2013-05-02: 6 mg via SUBCUTANEOUS
  Filled 2013-05-02: qty 0.6

## 2013-05-07 ENCOUNTER — Telehealth: Payer: Self-pay | Admitting: *Deleted

## 2013-05-07 ENCOUNTER — Encounter: Payer: Self-pay | Admitting: Physician Assistant

## 2013-05-07 ENCOUNTER — Ambulatory Visit: Payer: BC Managed Care – PPO | Admitting: Physician Assistant

## 2013-05-07 ENCOUNTER — Other Ambulatory Visit (HOSPITAL_BASED_OUTPATIENT_CLINIC_OR_DEPARTMENT_OTHER): Payer: BC Managed Care – PPO | Admitting: Lab

## 2013-05-07 ENCOUNTER — Ambulatory Visit (HOSPITAL_BASED_OUTPATIENT_CLINIC_OR_DEPARTMENT_OTHER): Payer: BC Managed Care – PPO | Admitting: Physician Assistant

## 2013-05-07 ENCOUNTER — Other Ambulatory Visit: Payer: BC Managed Care – PPO | Admitting: Lab

## 2013-05-07 VITALS — BP 103/64 | HR 76 | Temp 98.4°F | Resp 20 | Ht 68.0 in | Wt 122.2 lb

## 2013-05-07 DIAGNOSIS — C50419 Malignant neoplasm of upper-outer quadrant of unspecified female breast: Secondary | ICD-10-CM

## 2013-05-07 DIAGNOSIS — Z171 Estrogen receptor negative status [ER-]: Secondary | ICD-10-CM

## 2013-05-07 DIAGNOSIS — C50919 Malignant neoplasm of unspecified site of unspecified female breast: Secondary | ICD-10-CM

## 2013-05-07 DIAGNOSIS — C50411 Malignant neoplasm of upper-outer quadrant of right female breast: Secondary | ICD-10-CM

## 2013-05-07 LAB — CBC WITH DIFFERENTIAL/PLATELET
BASO%: 1.3 % (ref 0.0–2.0)
Basophils Absolute: 0 10*3/uL (ref 0.0–0.1)
EOS%: 0.4 % (ref 0.0–7.0)
HCT: 36.1 % (ref 34.8–46.6)
HGB: 12.2 g/dL (ref 11.6–15.9)
LYMPH%: 25.5 % (ref 14.0–49.7)
MCH: 31.4 pg (ref 25.1–34.0)
MCHC: 33.8 g/dL (ref 31.5–36.0)
MCV: 92.9 fL (ref 79.5–101.0)
MONO#: 0.1 10*3/uL (ref 0.1–0.9)
MONO%: 4.6 % (ref 0.0–14.0)
NEUT%: 68.2 % (ref 38.4–76.8)
Platelets: 129 10*3/uL — ABNORMAL LOW (ref 145–400)
RBC: 3.88 10*6/uL (ref 3.70–5.45)
WBC: 2.5 10*3/uL — ABNORMAL LOW (ref 3.9–10.3)

## 2013-05-07 NOTE — Telephone Encounter (Signed)
appts made and printed. Pt is aware that tx will be added. i emailed MW to add the tx. Pt is aware that cs will call w/ MR appt...td

## 2013-05-07 NOTE — Telephone Encounter (Signed)
Per staff message and POF I have scheduled appts.  JMW  

## 2013-05-07 NOTE — Progress Notes (Signed)
Patient ID: Janet [name], female   DOB: October 27, 1955, 57 y.o.   MRN: [id number] ID: [name] OB: 10-30-1955  MR#: [id number]  FAO#:[id number]  PCP: Astrid Divine, MD GYN:   SU: Cicero Duck OTHER MD: Chipper Herb, Maryelizabeth Kaufmann  CHIEF COMPLAINT:  Right Breast Cancer   HISTORY OF PRESENT ILLNESS: "Janet [name]" herself noted a lump in her right breast on 03/17/2013, and immediately contacted her physician so that on 03/18/2013 bilateral diagnostic mammography and right breast ultrasonography at the breast Center showed a developing mass in the subareolar region of the right breast. There were no malignant type calcifications. This mass was palpable in the upper outer quadrant, and by ultrasound measured 2.6 cm. Ultrasonography of the right axilla showed a lymph node measuring 1.1 cm, with a thickened cortex. The left breast was unremarkable.  Biopsy of the right breast mass 08/26//2014 showed ([name]) and invasive ductal carcinoma, grade 3, triple negative, with an MIB-1 of 54%. Biopsy of the right axillary lymph node in question at the same time was negative.  Bilateral breast MRI 03/26/2013 measured the right subareolar mass at 3.8 cm. The previously biopsied benign right axillary lymph node was again noted. There were no other suspicious areas in the right breast or axilla and the left breast was unremarkable.  Her subsequent history is as detailed below  INTERVAL HISTORY: [name] returns today for followup of her locally advanced right breast cancer. She is currently day 8 cycle 2 of 4 planned dose dense cycles of doxorubicin/cyclophosphamide being given in the neoadjuvant setting. She receives Neulasta on day 2 for granulocyte support.  Overall, Janet [name] tolerated cycle 2 very well. In fact she believes it was a little easier than cycle 1. She continues to run on a daily basis. She was able to travel to [place] to visit her daughter over the weekend. Of course she continues to  work full-time as well.   Janet [name] did have one migraine headache last night for which she took one dose of Imitrex. The headache resolved quickly and easily, and she's had no headache today. She denies any dizziness or change in vision. She also denies any problems at all with nausea or emesis.   REVIEW OF SYSTEMS: [name] denies any fevers or chills. She's had no skin changes, rashes, abnormal bruising, or bleeding. She's had no mouth ulcers or oral sensitivity and is eating and drinking well.   She has had no change in bowel or bladder habits. She's had no increased cough and denies shortness of breath, chest pain, or palpitations. She also denies any current pain, specifically  no myalgias, arthralgias, or bony pain. She's had no peripheral swelling.  A detailed review of systems is otherwise stable and noncontributory.   PAST MEDICAL HISTORY: Past Medical History  Diagnosis Date  . Migraines   . Cancer     breast    PAST SURGICAL HISTORY: Past Surgical History  Procedure Laterality Date  . Removal of cataracts    . Foot surgery  age 89    bone graft  . Portacath placement N/A 04/04/2013    Procedure: INSERTION PORT-A-CATH;  Surgeon: Currie Paris, MD;  Location: [facility];  Service: General;  Laterality: N/A;    FAMILY HISTORY Family History  Problem Relation Age of Onset  . Pancreatic cancer Mother   . Breast cancer Maternal Grandmother    the patient's father died at the age of 65 with pancreatic cancer. (He was Dr. Chrystine Oiler). The patient's  mother is alive at age 53. Janet [name] has one brother, no sisters. [name]'s mother died from breast cancer at the age of 49. There is no history of ovarian cancer in the family  GYNECOLOGIC HISTORY:  Menarche age 20, first live birth age 25. She is GX P2. She stopped having periods approximately 2004. She did not take hormone replacement. She did take birth control pills remotely for 5-10 years, with no  complications.  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 Her husband the Loyal. ARoxan Hockey "Robby" Jeane, is a local judge. The patient's 2 children are Miley who lives in [place] and works as a Financial risk analyst, and Hayden, who works in [place] and is an associate with a Set designer group.  ADVANCED DIRECTIVES: In place   HEALTH MAINTENANCE: (Updated 05/07/2013) History  Substance Use Topics  . Smoking status: Never Smoker   . Smokeless tobacco: Never Used  . Alcohol Use: Yes     Comment: occasional     Colonoscopy:  PAP:  Oct 2014, Dr. Valentina Lucks  Bone density: July 2013 at [facility],    osteopenia  Lipid panel: UTD, Dr. Valentina Lucks   Allergies  Allergen Reactions  . Penicillins     Current Outpatient Prescriptions  Medication Sig Dispense Refill  . acetaminophen (TYLENOL) 325 MG tablet Take 650 mg by mouth every 6 (six) hours as needed for pain.      Marland Kitchen CALCIUM PO Take by mouth daily.      Marland Kitchen dexamethasone (DECADRON) 4 MG tablet Take 2 tablets by mouth once a day on the day after chemotherapy and then take 2 tablets two times a day for 2 days. Take with food.  30 tablet  1  . HYDROcodone-acetaminophen (NORCO) 5-325 MG per tablet Take 1 tablet by mouth every 4 (four) hours as needed for pain.  30 tablet  0  . lidocaine-prilocaine (EMLA) cream Apply topically as needed.  30 g  0  . LORazepam (ATIVAN) 0.5 MG tablet Take 1 tablet (0.5 mg total) by mouth every 6 (six) hours as needed (Nausea or vomiting).  30 tablet  0  . Multiple Vitamin (MULTI-VITAMIN DAILY PO) Take by mouth daily.      . ondansetron (ZOFRAN) 8 MG tablet Take 1 tablet (8 mg total) by mouth 2 (two) times daily as needed. Take two times a day as needed for nausea or vomiting starting on the third day  after chemotherapy.  30 tablet  1  . polyethylene glycol powder (MIRALAX) powder Take 17 g by mouth daily.  255 g  0  . prochlorperazine (COMPAZINE) 10 MG tablet Take 1 tablet (10 mg total) by mouth every 6 (six) hours as needed (Nausea or vomiting).  30 tablet  1  . SUMAtriptan (IMITREX) 50 MG tablet Take 1 tablet (50 mg total) by mouth every 2 (two) hours as needed for migraine. May repeat in 2 hours if headache persists or recurs.  10 tablet  0   No current facility-administered medications for this visit.    OBJECTIVE: Middle-aged white woman  who appears well and is in no acute distress Filed Vitals:   05/07/13 1333  BP: 103/64  Pulse: 76  Temp: 98.4 F (36.9 C)  Resp: 20  Body mass index is 18.58 kg/(m^2).    ECOG FS:0 Filed Weights   05/07/13 1333  Weight: 122 lb 3.2 oz (55.43 kg)   Physical Exam: HEENT:  Sclerae anicteric.  Oropharynx clear. No ulcerations or evidence of candidiasis. NODES:  No cervical or supraclavicular lymphadenopathy palpated.  BREAST EXAM:  Deferred. Axillae are benign bilaterally with no palpable adenopathy LUNGS:  Clear to auscultation bilaterally.  No wheezes or rhonchi.  Good excursion bilaterally. HEART:  Regular rate and rhythm. No murmur ABDOMEN:  Soft, nontender to palpation.  Positive bowel sounds.  MSK:  No focal spinal tenderness to palpation. Full range of motion in the upper extremities. EXTREMITIES:  No peripheral edema.   NEURO:  Nonfocal. Well oriented.  Positive affect.   LAB RESULTS:  CMP     Component Value Date/Time   NA 142 03/27/2013 0807   K 4.3 03/27/2013 0807   CO2 24 03/27/2013 0807   GLUCOSE 87 03/27/2013 0807   BUN 16.7 03/27/2013 0807   CREATININE 0.8 03/27/2013 0807   CALCIUM 10.3 03/27/2013 0807   PROT 6.2* 03/27/2013 0807   ALBUMIN 3.6 03/27/2013 0807   AST 21 03/27/2013 0807   ALT 29 03/27/2013 0807   ALKPHOS 73 03/27/2013 0807   BILITOT 0.46 03/27/2013 0807     Lab Results  Component Value Date   WBC 2.5* 05/07/2013    NEUTROABS 1.7 05/07/2013   HGB 12.2 05/07/2013   HCT 36.1 05/07/2013   MCV 92.9 05/07/2013   PLT 129* 05/07/2013      STUDIES:   No recent studies.    ASSESSMENT: 57 y.o. [place] woman   (1)  status post right breast biopsy 03/19/2013 for a clinical T2 N0, stage IIA invasive ductal breast cancer, grade 3, triple negative, with an MIB-1 of 54%.  (2) suspicious right axillary lymph node biopsy negative  (3) started dose dense doxorubicin/ cyclophosphamide 04/17/2013, at standard doses, with Neulasta support; 4 cycles planned to be followed by 12 weekly doses of carboplatin/paclitaxel prior to definitive surgery l  PLAN: Janet [name] continues to tolerate treatment extremely well. She is scheduled to return next week on October 21 for repeat labs and physical exam, in anticipation of day 1 cycle 3 of doxorubicin/cyclophosphamide on October 23. She received Neulasta on October 24, and I will see her again the following week for assessment of chemotoxicity.  In an attempt to accommodate her work schedule, we have scheduled her out for most of her lab and followup visits through the end of December.  We will add her chemotherapy appointments as we go along, but tentatively she is scheduled to receive her first dose of weekly carboplatin/paclitaxel on November 19. Just prior to that treatment, we will repeat a breast MRI to assess response to therapy thus far, and she will see Dr. Darnelle Catalan to review those results.  This was reviewed in detail with the patient today. She voices understanding and agreement with our plan, and will call with any changes or problems prior to her next appointment.   Melonie Germani, PA-C   05/07/2013 2:21 PM

## 2013-05-09 ENCOUNTER — Ambulatory Visit: Payer: BC Managed Care – PPO

## 2013-05-10 ENCOUNTER — Encounter: Payer: Self-pay | Admitting: Physician Assistant

## 2013-05-10 ENCOUNTER — Other Ambulatory Visit: Payer: Self-pay

## 2013-05-10 DIAGNOSIS — C50411 Malignant neoplasm of upper-outer quadrant of right female breast: Secondary | ICD-10-CM

## 2013-05-10 MED ORDER — SUMATRIPTAN SUCCINATE 50 MG PO TABS: 50.0000 mg | ORAL_TABLET | ORAL | Status: DC | PRN

## 2013-05-10 MED ORDER — PROCHLORPERAZINE MALEATE 10 MG PO TABS: 10.0000 mg | ORAL_TABLET | Freq: Four times a day (QID) | ORAL | Status: DC | PRN

## 2013-05-10 NOTE — Telephone Encounter (Signed)
Pt. Used MyChart to request refills.  Ok per [employer] PA-C

## 2013-05-14 ENCOUNTER — Encounter: Payer: Self-pay | Admitting: Physician Assistant

## 2013-05-14 ENCOUNTER — Other Ambulatory Visit (HOSPITAL_BASED_OUTPATIENT_CLINIC_OR_DEPARTMENT_OTHER): Payer: BC Managed Care – PPO | Admitting: Lab

## 2013-05-14 ENCOUNTER — Ambulatory Visit (HOSPITAL_BASED_OUTPATIENT_CLINIC_OR_DEPARTMENT_OTHER): Payer: BC Managed Care – PPO | Admitting: Physician Assistant

## 2013-05-14 VITALS — BP 118/70 | HR 73 | Temp 98.3°F | Resp 18 | Ht 68.0 in | Wt 123.2 lb

## 2013-05-14 DIAGNOSIS — C50411 Malignant neoplasm of upper-outer quadrant of right female breast: Secondary | ICD-10-CM

## 2013-05-14 DIAGNOSIS — C50419 Malignant neoplasm of upper-outer quadrant of unspecified female breast: Secondary | ICD-10-CM

## 2013-05-14 DIAGNOSIS — G43909 Migraine, unspecified, not intractable, without status migrainosus: Secondary | ICD-10-CM

## 2013-05-14 DIAGNOSIS — C50919 Malignant neoplasm of unspecified site of unspecified female breast: Secondary | ICD-10-CM

## 2013-05-14 DIAGNOSIS — Z171 Estrogen receptor negative status [ER-]: Secondary | ICD-10-CM

## 2013-05-14 LAB — CBC WITH DIFFERENTIAL/PLATELET
BASO%: 0.3 % (ref 0.0–2.0)
Basophils Absolute: 0 10*3/uL (ref 0.0–0.1)
EOS%: 0.1 % (ref 0.0–7.0)
HCT: 38 % (ref 34.8–46.6)
HGB: 12.6 g/dL (ref 11.6–15.9)
LYMPH%: 13.5 % — ABNORMAL LOW (ref 14.0–49.7)
MCH: 30.9 pg (ref 25.1–34.0)
MCHC: 33.1 g/dL (ref 31.5–36.0)
MCV: 93.3 fL (ref 79.5–101.0)
MONO%: 6.7 % (ref 0.0–14.0)
NEUT%: 79.4 % — ABNORMAL HIGH (ref 38.4–76.8)
RDW: 12.7 % (ref 11.2–14.5)

## 2013-05-14 LAB — COMPREHENSIVE METABOLIC PANEL (CC13)
ALT: 41 U/L (ref 0–55)
AST: 21 U/L (ref 5–34)
Alkaline Phosphatase: 106 U/L (ref 40–150)
BUN: 14.2 mg/dL (ref 7.0–26.0)
Creatinine: 1 mg/dL (ref 0.6–1.1)

## 2013-05-14 NOTE — Progress Notes (Signed)
Patient ID: Janet [name], female   DOB: 10-11-1955, 57 y.o.   MRN: [id number] ID: Janet [name] OB: 07/12/1956  MR#: [id number]  NWG#:[id number]  PCP: Astrid Divine, MD GYN:   SU: Cicero Duck OTHER MD: Chipper Herb, Maryelizabeth Kaufmann  CHIEF COMPLAINT:  Right Breast Cancer   HISTORY OF PRESENT ILLNESS: "Janet [name]" herself noted a lump in her right breast on 03/17/2013, and immediately contacted her physician so that on 03/18/2013 bilateral diagnostic mammography and right breast ultrasonography at the breast Center showed a developing mass in the subareolar region of the right breast. There were no malignant type calcifications. This mass was palpable in the upper outer quadrant, and by ultrasound measured 2.6 cm. Ultrasonography of the right axilla showed a lymph node measuring 1.1 cm, with a thickened cortex. The left breast was unremarkable.  Biopsy of the right breast mass 08/26//2014 showed ([name]) and invasive ductal carcinoma, grade 3, triple negative, with an MIB-1 of 54%. Biopsy of the right axillary lymph node in question at the same time was negative.  Bilateral breast MRI 03/26/2013 measured the right subareolar mass at 3.8 cm. The previously biopsied benign right axillary lymph node was again noted. There were no other suspicious areas in the right breast or axilla and the left breast was unremarkable.  Her subsequent history is as detailed below  INTERVAL HISTORY: [name] returns alone today for followup of her locally advanced right breast cancer. She is being treated in the neoadjuvant setting and is due for day 1 cycle 3 of 4 planned dose dense cycles of doxorubicin/cyclophosphamide later this week on 05/16/2013.  She receives Neulasta on day 2 for granulocyte support.  Overall, Janet [name] continues to tolerate treatment well. Her biggest complaint are some migraine headaches that 10 to occur during the night. She takes Imitrex affectively, and has also taken some  over-the-counter migraine medication (it sounds like Excedrin Migraine or something similar). The headaches seemed to occur more frequently during the first week following chemotherapy, then resolved. She's had no problems with headaches over the past 4-5 days. She denies any nausea or emesis, and has had no dizziness or change in vision.   REVIEW OF SYSTEMS: [name] denies any fevers or chills.  Overall, her energy level is good, and she continues to exercise on a daily basis and work full-time. She's had no skin changes, rashes, abnormal bruising, or bleeding. She's had no mouth ulcers or oral sensitivity and is eating and drinking well, keeping herself well hydrated. She has had no change in bowel or bladder habits. She does take MiraLAX for the first few days after chemotherapy to avoid constipation. She's had no increased cough and denies shortness of breath, chest pain, or palpitations. She also denies any current pain, specifically  no myalgias, arthralgias, or bony pain. She's had no peripheral swelling.  A detailed review of systems is otherwise stable and noncontributory.   PAST MEDICAL HISTORY: Past Medical History  Diagnosis Date  . Migraines   . Cancer     breast    PAST SURGICAL HISTORY: Past Surgical History  Procedure Laterality Date  . Removal of cataracts    . Foot surgery  age 57    bone graft  . Portacath placement N/A 04/04/2013    Procedure: INSERTION PORT-A-CATH;  Surgeon: Currie Paris, MD;  Location: [facility];  Service: General;  Laterality: N/A;    FAMILY HISTORY Family History  Problem Relation Age of Onset  . Pancreatic cancer Mother   .  Breast cancer Maternal Grandmother    the patient's father died at the age of 3 with pancreatic cancer. (He was Dr. Chrystine Oiler). [name] is alive at age 76. Sallye Ober has one brother, no sisters. [name]'s mother died from breast cancer at the age of 56. There is no history of ovarian cancer in  the family  GYNECOLOGIC HISTORY:  Menarche age 54, first live birth age 23. She is GX P2. She stopped having periods approximately 2004. She did not take hormone replacement. She did take birth control pills remotely for 5-10 years, with no complications.  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 Her husband the Janet [name]. ARoxan Hockey "Janet" [name], is a local judge. The patient's 2 children are Janet [name] who lives in [place] and works as a Financial risk analyst, and Janet [name], who works in [place] and is an associate with a Set designer group.  ADVANCED DIRECTIVES: In place   HEALTH MAINTENANCE: (Updated 05/07/2013) History  Substance Use Topics  . Smoking status: Never Smoker   . Smokeless tobacco: Never Used  . Alcohol Use: Yes     Comment: occasional     Colonoscopy:  PAP:  Oct 2014, Dr. Valentina Lucks  Bone density: July 2013 at [facility],    osteopenia  Lipid panel: UTD, Dr. Valentina Lucks   Allergies  Allergen Reactions  . Penicillins     Current Outpatient Prescriptions  Medication Sig Dispense Refill  . acetaminophen (TYLENOL) 325 MG tablet Take 650 mg by mouth every 6 (six) hours as needed for pain.      Marland Kitchen CALCIUM PO Take by mouth daily.      Marland Kitchen dexamethasone (DECADRON) 4 MG tablet Take 2 tablets by mouth once a day on the day after chemotherapy and then take 2 tablets two times a day for 2 days. Take with food.  30 tablet  1  . HYDROcodone-acetaminophen (NORCO) 5-325 MG per tablet Take 1 tablet by mouth every 4 (four) hours as needed for pain.  30 tablet  0  . lidocaine-prilocaine (EMLA) cream Apply topically as needed.  30 g  0  . LORazepam (ATIVAN) 0.5 MG tablet Take 1 tablet (0.5 mg total) by mouth every 6 (six) hours as needed (Nausea or vomiting).  30 tablet  0  . Multiple  Vitamin (MULTI-VITAMIN DAILY PO) Take by mouth daily.      . polyethylene glycol powder (MIRALAX) powder Take 17 g by mouth daily.  255 g  0  . prochlorperazine (COMPAZINE) 10 MG tablet Take 1 tablet (10 mg total) by mouth every 6 (six) hours as needed (Nausea or vomiting).  30 tablet  1  . SUMAtriptan (IMITREX) 50 MG tablet Take 1 tablet (50 mg total) by mouth every 2 (two) hours as needed for migraine. May repeat in 2 hours if headache persists or recurs.  10 tablet  0  . ondansetron (ZOFRAN) 8 MG tablet Take 1 tablet (8 mg total) by mouth 2 (two) times daily as needed. Take two times a day as needed for nausea or vomiting starting on the third day after chemotherapy.  30 tablet  1   No current facility-administered medications for this visit.    OBJECTIVE: Middle-aged white woman  who appears comfortable and is in no  acute distress Filed Vitals:   05/14/13 1426  BP: 118/70  Pulse: 73  Temp: 98.3 F (36.8 C)  Resp: 18     Body mass index is 18.74 kg/(m^2).    ECOG FS:0 Filed Weights   05/14/13 1426  Weight: 123 lb 3.2 oz (55.883 kg)   Physical Exam: HEENT:  Sclerae anicteric.  Oropharynx clear. No ulcerations or evidence of candidiasis. NODES:  No cervical or supraclavicular lymphadenopathy palpated.  BREAST EXAM:   Palpable mass in the central portion of the right breast, movable, and soft, measuring approximately 1.5 cm. Left breast is unremarkable. Axillae are benign bilaterally with no palpable adenopathy LUNGS:  Clear to auscultation bilaterally.  No wheezes or rhonchi. HEART:  Regular rate and rhythm. No murmur SKIN: Port is intact in the left upper chest wall with no erythema or edema, no evidence of cellulitis or infection. ABDOMEN:  Soft, nontender to palpation.  Positive bowel sounds.  MSK:  No focal spinal tenderness to palpation. Full range of motion in the upper extremities. EXTREMITIES:  No peripheral edema.   NEURO:  Nonfocal. Well oriented.  Positive  affect.    LAB RESULTS:  CMP     Component Value Date/Time   NA 140 05/14/2013 1349   K 4.5 05/14/2013 1349   CO2 28 05/14/2013 1349   GLUCOSE 72 05/14/2013 1349   BUN 14.2 05/14/2013 1349   CREATININE 1.0 05/14/2013 1349   CALCIUM 10.5* 05/14/2013 1349   PROT 6.4 05/14/2013 1349   ALBUMIN 3.7 05/14/2013 1349   AST 21 05/14/2013 1349   ALT 41 05/14/2013 1349   ALKPHOS 106 05/14/2013 1349   BILITOT 0.20 05/14/2013 1349     Lab Results  Component Value Date   WBC 12.1* 05/14/2013   NEUTROABS 9.6* 05/14/2013   HGB 12.6 05/14/2013   HCT 38.0 05/14/2013   MCV 93.3 05/14/2013   PLT 158 05/14/2013      STUDIES:   No recent studies.    ASSESSMENT: 57 y.o. [place] woman   (1)  status post right breast biopsy 03/19/2013 for a clinical T2 N0, stage IIA invasive ductal breast cancer, grade 3, triple negative, with an MIB-1 of 54%.  (2) suspicious right axillary lymph node biopsy negative  (3) started dose dense doxorubicin/ cyclophosphamide 04/17/2013, at standard doses, with Neulasta support; 4 cycles planned to be followed by 12 weekly doses of carboplatin/paclitaxel prior to definitive surgery l  PLAN: [name] will return Thursday (05/16/2013) as scheduled for day 1 cycle 3 of dose dense doxorubicin/cyclophosphamide. She'll continue to utilize dexamethasone and prochlorperazine as her anti-emetic medications. She'll also continue to utilize the Imitrex as needed for migraines. Also suggested trying a low dose of Benadryl at bedtime to see if this helps with the nighttime headache.   Janet [name] will receive chemotherapy on October 23, we'll have her Neulasta injection on October 24, and I'll see her next week on October 28 for assessment of chemotoxicity. The plan is to complete 4 cycles of this regimen, then repeat a breast MRI in mid November to assess response to neoadjuvant chemotherapy. We will set her up to see her surgeon, Dr. Jamey Ripa,  soon thereafter as well. We  will then proceed with 12 weekly doses of carboplatin/paclitaxel prior to definitive surgery.   Of note, I have asked always to hold her calcium supplement for now, and we will continue to follow her for very mild hypercalcemia.  All this was reviewed with the patient today.  She voices understanding and agreement  with our plan, and will call with any changes or problems prior to her next appointment.   Anahli Arvanitis, PA-C   05/14/2013 2:50 PM

## 2013-05-15 ENCOUNTER — Telehealth: Payer: Self-pay | Admitting: *Deleted

## 2013-05-15 ENCOUNTER — Ambulatory Visit: Payer: BC Managed Care – PPO

## 2013-05-15 ENCOUNTER — Other Ambulatory Visit: Payer: Self-pay | Admitting: Physician Assistant

## 2013-05-15 DIAGNOSIS — C50411 Malignant neoplasm of upper-outer quadrant of right female breast: Secondary | ICD-10-CM

## 2013-05-15 NOTE — Telephone Encounter (Signed)
Per staff message and POF I have scheduled appts.  JMW  

## 2013-05-16 ENCOUNTER — Telehealth: Payer: Self-pay | Admitting: Oncology

## 2013-05-16 ENCOUNTER — Ambulatory Visit: Payer: BC Managed Care – PPO

## 2013-05-16 ENCOUNTER — Ambulatory Visit (HOSPITAL_BASED_OUTPATIENT_CLINIC_OR_DEPARTMENT_OTHER): Payer: BC Managed Care – PPO

## 2013-05-16 VITALS — BP 114/66 | HR 69 | Temp 98.2°F

## 2013-05-16 DIAGNOSIS — C50411 Malignant neoplasm of upper-outer quadrant of right female breast: Secondary | ICD-10-CM

## 2013-05-16 DIAGNOSIS — Z5111 Encounter for antineoplastic chemotherapy: Secondary | ICD-10-CM

## 2013-05-16 DIAGNOSIS — C50419 Malignant neoplasm of upper-outer quadrant of unspecified female breast: Secondary | ICD-10-CM

## 2013-05-16 MED ORDER — SODIUM CHLORIDE 0.9 % IV SOLN
150.0000 mg | Freq: Once | INTRAVENOUS | Status: AC
  Administered 2013-05-16: 150 mg via INTRAVENOUS
  Filled 2013-05-16: qty 5

## 2013-05-16 MED ORDER — SODIUM CHLORIDE 0.9 % IV SOLN
600.0000 mg/m2 | Freq: Once | INTRAVENOUS | Status: AC
  Administered 2013-05-16: 980 mg via INTRAVENOUS
  Filled 2013-05-16: qty 49

## 2013-05-16 MED ORDER — DEXAMETHASONE SODIUM PHOSPHATE 20 MG/5ML IJ SOLN
12.0000 mg | Freq: Once | INTRAMUSCULAR | Status: AC
  Administered 2013-05-16: 12 mg via INTRAVENOUS

## 2013-05-16 MED ORDER — DEXAMETHASONE SODIUM PHOSPHATE 20 MG/5ML IJ SOLN
INTRAMUSCULAR | Status: AC
  Filled 2013-05-16: qty 5

## 2013-05-16 MED ORDER — PALONOSETRON HCL INJECTION 0.25 MG/5ML
0.2500 mg | Freq: Once | INTRAVENOUS | Status: AC
  Administered 2013-05-16: 0.25 mg via INTRAVENOUS

## 2013-05-16 MED ORDER — PALONOSETRON HCL INJECTION 0.25 MG/5ML
INTRAVENOUS | Status: AC
  Filled 2013-05-16: qty 5

## 2013-05-16 MED ORDER — DOXORUBICIN HCL CHEMO IV INJECTION 2 MG/ML
60.0000 mg/m2 | Freq: Once | INTRAVENOUS | Status: AC
  Administered 2013-05-16: 98 mg via INTRAVENOUS
  Filled 2013-05-16: qty 49

## 2013-05-16 MED ORDER — SODIUM CHLORIDE 0.9 % IV SOLN
Freq: Once | INTRAVENOUS | Status: AC
  Administered 2013-05-16: 09:00:00 via INTRAVENOUS

## 2013-05-16 MED ORDER — HEPARIN SOD (PORK) LOCK FLUSH 100 UNIT/ML IV SOLN
500.0000 [IU] | Freq: Once | INTRAVENOUS | Status: AC | PRN
  Administered 2013-05-16: 500 [IU]
  Filled 2013-05-16: qty 5

## 2013-05-16 MED ORDER — SODIUM CHLORIDE 0.9 % IJ SOLN
10.0000 mL | INTRAMUSCULAR | Status: DC | PRN
  Administered 2013-05-16: 10 mL
  Filled 2013-05-16: qty 10

## 2013-05-16 NOTE — Progress Notes (Unsigned)
Good blood return pre, during, and after adriamycin.  Pt without complaints.

## 2013-05-16 NOTE — Patient Instructions (Signed)
Cancer Center Discharge Instructions for Patients Receiving Chemotherapy  Today you received the following chemotherapy agents: Adriamycin, Cytoxan   To help prevent nausea and vomiting after your treatment, we encourage you to take your nausea medication as prescribed.    If you develop nausea and vomiting that is not controlled by your nausea medication, call the clinic.   BELOW ARE SYMPTOMS THAT SHOULD BE REPORTED IMMEDIATELY:  *FEVER GREATER THAN 100.5 F  *CHILLS WITH OR WITHOUT FEVER  NAUSEA AND VOMITING THAT IS NOT CONTROLLED WITH YOUR NAUSEA MEDICATION  *UNUSUAL SHORTNESS OF BREATH  *UNUSUAL BRUISING OR BLEEDING  TENDERNESS IN MOUTH AND THROAT WITH OR WITHOUT PRESENCE OF ULCERS  *URINARY PROBLEMS  *BOWEL PROBLEMS  UNUSUAL RASH Items with * indicate a potential emergency and should be followed up as soon as possible.  Feel free to call the clinic you have any questions or concerns. The clinic phone number is [phone].    

## 2013-05-17 ENCOUNTER — Ambulatory Visit (HOSPITAL_BASED_OUTPATIENT_CLINIC_OR_DEPARTMENT_OTHER): Payer: BC Managed Care – PPO

## 2013-05-17 VITALS — BP 124/61 | HR 78 | Temp 98.1°F

## 2013-05-17 DIAGNOSIS — C50419 Malignant neoplasm of upper-outer quadrant of unspecified female breast: Secondary | ICD-10-CM

## 2013-05-17 DIAGNOSIS — Z5189 Encounter for other specified aftercare: Secondary | ICD-10-CM

## 2013-05-17 DIAGNOSIS — C50411 Malignant neoplasm of upper-outer quadrant of right female breast: Secondary | ICD-10-CM

## 2013-05-17 MED ORDER — PEGFILGRASTIM INJECTION 6 MG/0.6ML
6.0000 mg | Freq: Once | SUBCUTANEOUS | Status: AC
  Administered 2013-05-17: 6 mg via SUBCUTANEOUS
  Filled 2013-05-17: qty 0.6

## 2013-05-21 ENCOUNTER — Other Ambulatory Visit (HOSPITAL_BASED_OUTPATIENT_CLINIC_OR_DEPARTMENT_OTHER): Payer: BC Managed Care – PPO | Admitting: Lab

## 2013-05-21 ENCOUNTER — Ambulatory Visit (HOSPITAL_BASED_OUTPATIENT_CLINIC_OR_DEPARTMENT_OTHER): Payer: BC Managed Care – PPO | Admitting: Physician Assistant

## 2013-05-21 ENCOUNTER — Encounter: Payer: Self-pay | Admitting: Physician Assistant

## 2013-05-21 VITALS — BP 103/65 | HR 82 | Temp 98.5°F | Resp 18 | Ht 68.0 in | Wt 122.2 lb

## 2013-05-21 DIAGNOSIS — R05 Cough: Secondary | ICD-10-CM

## 2013-05-21 DIAGNOSIS — C50419 Malignant neoplasm of upper-outer quadrant of unspecified female breast: Secondary | ICD-10-CM

## 2013-05-21 DIAGNOSIS — J069 Acute upper respiratory infection, unspecified: Secondary | ICD-10-CM

## 2013-05-21 DIAGNOSIS — J040 Acute laryngitis: Secondary | ICD-10-CM

## 2013-05-21 DIAGNOSIS — Z171 Estrogen receptor negative status [ER-]: Secondary | ICD-10-CM

## 2013-05-21 DIAGNOSIS — C50411 Malignant neoplasm of upper-outer quadrant of right female breast: Secondary | ICD-10-CM

## 2013-05-21 DIAGNOSIS — C50919 Malignant neoplasm of unspecified site of unspecified female breast: Secondary | ICD-10-CM

## 2013-05-21 LAB — CBC WITH DIFFERENTIAL/PLATELET
Basophils Absolute: 0.1 10*3/uL (ref 0.0–0.1)
EOS%: 0.1 % (ref 0.0–7.0)
Eosinophils Absolute: 0 10*3/uL (ref 0.0–0.5)
HCT: 32.6 % — ABNORMAL LOW (ref 34.8–46.6)
HGB: 11 g/dL — ABNORMAL LOW (ref 11.6–15.9)
MCH: 31.3 pg (ref 25.1–34.0)
MCV: 93.1 fL (ref 79.5–101.0)
NEUT%: 93.6 % — ABNORMAL HIGH (ref 38.4–76.8)
RDW: 13.1 % (ref 11.2–14.5)
lymph#: 0.9 10*3/uL (ref 0.9–3.3)

## 2013-05-21 MED ORDER — AZITHROMYCIN 250 MG PO TABS: ORAL_TABLET | ORAL | Status: DC

## 2013-05-21 MED ORDER — MAGIC MOUTHWASH W/LIDOCAINE: 5.0000 mL | Freq: Four times a day (QID) | ORAL | Status: DC | PRN

## 2013-05-21 MED ORDER — BENZONATATE 100 MG PO CAPS: 100.0000 mg | ORAL_CAPSULE | Freq: Three times a day (TID) | ORAL | Status: DC | PRN

## 2013-05-21 NOTE — Progress Notes (Signed)
Patient ID: Janet [name], female   DOB: 1955-11-12, 57 y.o.   MRN: [id number] ID: [name] OB: [birth year]  MR#: [id number]  CSN#:[id number]  PCP: Astrid Divine, MD GYN:   SU: Cicero Duck OTHER MD: Chipper Herb, Maryelizabeth Kaufmann  CHIEF COMPLAINT:  Right Breast Cancer   HISTORY OF PRESENT ILLNESS: "Janet [name]" herself noted a lump in her right breast on 03/17/2013, and immediately contacted her physician so that on 03/18/2013 bilateral diagnostic mammography and right breast ultrasonography at the breast Center showed a developing mass in the subareolar region of the right breast. There were no malignant type calcifications. This mass was palpable in the upper outer quadrant, and by ultrasound measured 2.6 cm. Ultrasonography of the right axilla showed a lymph node measuring 1.1 cm, with a thickened cortex. The left breast was unremarkable.  Biopsy of the right breast mass 08/26//2014 showed ([name]) and invasive ductal carcinoma, grade 3, triple negative, with an MIB-1 of 54%. Biopsy of the right axillary lymph node in question at the same time was negative.  Bilateral breast MRI 03/26/2013 measured the right subareolar mass at 3.8 cm. The previously biopsied benign right axillary lymph node was again noted. There were no other suspicious areas in the right breast or axilla and the left breast was unremarkable.  Her subsequent history is as detailed below  INTERVAL HISTORY: Janet [name] returns alone today for followup of her locally advanced right breast cancer. She is being treated in the neoadjuvant setting and is currently day 6 cycle 3 of 4 planned dose dense cycles of doxorubicin/cyclophosphamide.  She receives Neulasta on day 2 for granulocyte support.  Although Janet [name] continues to tolerate the chemotherapy well, she is feeling very poorly today. She has developed an apparent upper respiratory infection, and has felt bad for the last few days. She has laryngitis, a sore throat,  nasal congestion, runny nose, and a cough. The cough is keeping her awake at night. She's taking Tylenol cold and cough with slight relief. She's had no abnormal headaches or dizziness. She denies any chest pain or palpitations. She's had no fevers or chills.     REVIEW OF SYSTEMS: Janet [name] continues to be very active, and is still working full time.   she tells me her energy level is slightly reduced, but she is still exercising on a daily basis.  She's had no rashes or skin changes. She does find that she is bruising more easily, but denies any signs of abnormal bleeding. I will mention that she has had no hemoptysis or epistasis. Her appetite is reduced and she is having some taste alteration. She denies, however, any nausea, emesis, or change in bowel or bladder habits. She's had no oral ulcerations, but tells me her mouth feels "sensitive". She's had no increased shortness of breath and denies orthopnea. She currently denies any unusual myalgias, arthralgias, or bony pain, and has had no peripheral swelling.  A detailed review of systems is otherwise stable and noncontributory.     PAST MEDICAL HISTORY: Past Medical History  Diagnosis Date  . Migraines   . Cancer     breast    PAST SURGICAL HISTORY: Past Surgical History  Procedure Laterality Date  . Removal of cataracts    . Foot surgery  age 46    bone graft  . Portacath placement N/A 04/04/2013    Procedure: INSERTION PORT-A-CATH;  Surgeon: Currie Paris, MD;  Location: [facility];  Service: General;  Laterality: N/A;    FAMILY HISTORY  Family History  Problem Relation Age of Onset  . Pancreatic cancer Mother   . Breast cancer Maternal Grandmother    the patient's father died at the age of 5 with pancreatic cancer. (He was Dr. Chrystine Oiler). [name] is alive at age 68. Sallye Ober has one brother, no sisters. [name]'s mother died from breast cancer at the age of 29. There is no history of ovarian  cancer in the family  GYNECOLOGIC HISTORY:  Menarche age 64, first live birth age 70. She is GX P2. She stopped having periods approximately 2004. She did not take hormone replacement. She did take birth control pills remotely for 5-10 years, with no complications.  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 Her husband the [place]. ARoxan Hockey "Robby" [place], is a local judge. The patient's 2 children are Miley who lives in [place] and works as a Financial risk analyst, and [place], who works in [place] and is an associate with a Set designer group.  ADVANCED DIRECTIVES: In place   HEALTH MAINTENANCE: (Updated 05/21/2013) History  Substance Use Topics  . Smoking status: Never Smoker   . Smokeless tobacco: Never Used  . Alcohol Use: Yes     Comment: occasional     Colonoscopy:  PAP:  Oct 2014, Dr. Valentina Lucks  Bone density: July 2013 at [facility],    osteopenia  Lipid panel: UTD, Dr. Valentina Lucks   Allergies  Allergen Reactions  . Penicillins     Current Outpatient Prescriptions  Medication Sig Dispense Refill  . acetaminophen (TYLENOL) 325 MG tablet Take 650 mg by mouth every 6 (six) hours as needed for pain.      Marland Kitchen dexamethasone (DECADRON) 4 MG tablet Take 2 tablets by mouth once a day on the day after chemotherapy and then take 2 tablets two times a day for 2 days. Take with food.  30 tablet  1  . lidocaine-prilocaine (EMLA) cream Apply topically as needed.  30 g  0  . LORazepam (ATIVAN) 0.5 MG tablet Take 1 tablet (0.5 mg total) by mouth every 6 (six) hours as needed (Nausea or vomiting).  30 tablet  0  . Multiple Vitamin (MULTI-VITAMIN DAILY PO) Take by mouth daily.      . ondansetron (ZOFRAN) 8 MG tablet Take 1 tablet (8 mg total) by mouth 2 (two) times  daily as needed. Take two times a day as needed for nausea or vomiting starting on the third day after chemotherapy.  30 tablet  1  . prochlorperazine (COMPAZINE) 10 MG tablet Take 1 tablet (10 mg total) by mouth every 6 (six) hours as needed (Nausea or vomiting).  30 tablet  1  . SUMAtriptan (IMITREX) 50 MG tablet Take 1 tablet (50 mg total) by mouth every 2 (two) hours as needed for migraine. May repeat in 2 hours if headache persists or recurs.  10 tablet  0  . Alum & Mag Hydroxide-Simeth (MAGIC MOUTHWASH W/LIDOCAINE) SOLN Take 5 mLs by mouth 4 (four) times daily as needed. For throat or mouth pain  240 mL  1  . azithromycin (ZITHROMAX) 250 MG tablet 2 tabs by mouth x 1 day, then 1 tab by mouth x 4 days  6 each  0  . benzonatate (  TESSALON) 100 MG capsule Take 1 capsule (100 mg total) by mouth 3 (three) times daily as needed for cough.  30 capsule  0  . CALCIUM PO Take by mouth daily.      Marland Kitchen HYDROcodone-acetaminophen (NORCO) 5-325 MG per tablet Take 1 tablet by mouth every 4 (four) hours as needed for pain.  30 tablet  0  . polyethylene glycol powder (MIRALAX) powder Take 17 g by mouth daily.  255 g  0   No current facility-administered medications for this visit.   Facility-Administered Medications Ordered in Other Visits  Medication Dose Route Frequency Provider Last Rate Last Dose  . sodium chloride 0.9 % injection 10 mL  10 mL Intracatheter PRN Lowella Dell, MD   10 mL at 05/16/13 1111    OBJECTIVE: Middle-aged white woman  who is only able to speak in a whisper, but is in no acute distress  Filed Vitals:   05/21/13 1420  BP: 103/65  Pulse: 82  Temp: 98.5 F (36.9 C)  Resp: 18     Body mass index is 18.58 kg/(m^2).    ECOG FS:0 Filed Weights   05/21/13 1420  Weight: 122 lb 3.2 oz (55.43 kg)   Physical Exam: HEENT:  Sclerae anicteric.  Oropharynx clear. No ulcerations or evidence of candidiasis. No pharyngeal erythema or exudate noted. An  NODES:  No cervical or  supraclavicular lymphadenopathy palpated.  BREAST EXAM:  Deferred. Axillae are benign bilaterally with no palpable adenopathy LUNGS:  Clear to auscultation bilaterally.  No wheezes or rhonchi. Good excursion bilaterally. HEART:  Regular rate and rhythm. No murmur SKIN: Port is intact in the left upper chest wall with no erythema or edema, no evidence of cellulitis or infection. ABDOMEN:  Soft, nontender to palpation.  Positive bowel sounds.  MSK:  No focal spinal tenderness to palpation. Full range of motion in the upper extremities. EXTREMITIES:  No peripheral edema.   NEURO:  Nonfocal. Well oriented.  Positive affect.    LAB RESULTS:  CMP     Component Value Date/Time   NA 140 05/14/2013 1349   K 4.5 05/14/2013 1349   CO2 28 05/14/2013 1349   GLUCOSE 72 05/14/2013 1349   BUN 14.2 05/14/2013 1349   CREATININE 1.0 05/14/2013 1349   CALCIUM 10.5* 05/14/2013 1349   PROT 6.4 05/14/2013 1349   ALBUMIN 3.7 05/14/2013 1349   AST 21 05/14/2013 1349   ALT 41 05/14/2013 1349   ALKPHOS 106 05/14/2013 1349   BILITOT 0.20 05/14/2013 1349     Lab Results  Component Value Date   WBC 18.7* 05/21/2013   NEUTROABS 17.5* 05/21/2013   HGB 11.0* 05/21/2013   HCT 32.6* 05/21/2013   MCV 93.1 05/21/2013   PLT 125* 05/21/2013      STUDIES:   No recent studies.    ASSESSMENT: 57 y.o. [place] woman   (1)  status post right breast biopsy 03/19/2013 for a clinical T2 N0, stage IIA invasive ductal breast cancer, grade 3, triple negative, with an MIB-1 of 54%.  (2) suspicious right axillary lymph node biopsy negative  (3) started dose dense doxorubicin/ cyclophosphamide 04/17/2013, at standard doses, with Neulasta support; 4 cycles planned to be followed by 12 weekly doses of carboplatin/paclitaxel prior to definitive surgery   PLAN: Since she is currently undergoing active chemotherapy, I am going to go ahead and treat Janet [name] with a Z-Pak for her upper respiratory infection. I've  also given her a prescription for Tessalon Perles to take up to  3 times daily for the cough, especially at bedtime. She can continue to use the Tylenol Cold and Cough as needed, and I reminded her not to take that medication along with additional acetaminophen. I will also prescribe Magic mouthwash to swish/gargle and spit up to 4 times daily for the sore throat and oral sensitivity. Also suggested rinsing with baking soda solution several times daily.  I have asked Janet [name] to call us with any fevers of 100.3 or above, or if any of these current symptoms worsen. She was given all of the above information in writing today. I will see her back next week for repeat labs and physical exam on November 4, in anticipation of her fourth and final dose of doxorubicin/cyclophosphamide on November 5.   Janet [name] is already scheduled for a repeat breast MRI in mid November after the completion of all 4 cycles of doxorubicin/cyclophosphamide, and before we initiate her 12 weekly doses of carboplatin/paclitaxel. She'll see both Dr. Darnelle Catalan and Dr. Jamey Ripa soon after her MRI to assess response thus far. She voices understanding and agreement with this plan today, and knows to call with any changes or problems prior to her next appointment.     Netanya Yazdani, PA-C   05/21/2013 3:09 PM

## 2013-05-26 ENCOUNTER — Encounter: Payer: Self-pay | Admitting: Physician Assistant

## 2013-05-28 ENCOUNTER — Encounter: Payer: Self-pay | Admitting: Physician Assistant

## 2013-05-28 ENCOUNTER — Other Ambulatory Visit (HOSPITAL_BASED_OUTPATIENT_CLINIC_OR_DEPARTMENT_OTHER): Payer: BC Managed Care – PPO | Admitting: Lab

## 2013-05-28 ENCOUNTER — Ambulatory Visit (HOSPITAL_BASED_OUTPATIENT_CLINIC_OR_DEPARTMENT_OTHER): Payer: BC Managed Care – PPO | Admitting: Physician Assistant

## 2013-05-28 VITALS — BP 100/66 | HR 73 | Temp 98.3°F | Resp 18 | Ht 68.0 in | Wt 121.3 lb

## 2013-05-28 DIAGNOSIS — C50919 Malignant neoplasm of unspecified site of unspecified female breast: Secondary | ICD-10-CM

## 2013-05-28 DIAGNOSIS — C50419 Malignant neoplasm of upper-outer quadrant of unspecified female breast: Secondary | ICD-10-CM

## 2013-05-28 DIAGNOSIS — C50411 Malignant neoplasm of upper-outer quadrant of right female breast: Secondary | ICD-10-CM

## 2013-05-28 DIAGNOSIS — Z171 Estrogen receptor negative status [ER-]: Secondary | ICD-10-CM

## 2013-05-28 LAB — COMPREHENSIVE METABOLIC PANEL (CC13)
ALT: 37 U/L (ref 0–55)
AST: 23 U/L (ref 5–34)
Albumin: 3.8 g/dL (ref 3.5–5.0)
Anion Gap: 12 mEq/L — ABNORMAL HIGH (ref 3–11)
BUN: 11.9 mg/dL (ref 7.0–26.0)
CO2: 27 mEq/L (ref 22–29)
Calcium: 10.9 mg/dL — ABNORMAL HIGH (ref 8.4–10.4)
Chloride: 102 mEq/L (ref 98–109)
Creatinine: 0.8 mg/dL (ref 0.6–1.1)
Potassium: 3.8 mEq/L (ref 3.5–5.1)

## 2013-05-28 LAB — CBC WITH DIFFERENTIAL/PLATELET
Basophils Absolute: 0 10*3/uL (ref 0.0–0.1)
EOS%: 0.2 % (ref 0.0–7.0)
HCT: 35.4 % (ref 34.8–46.6)
HGB: 11.8 g/dL (ref 11.6–15.9)
MCHC: 33.4 g/dL (ref 31.5–36.0)
MCV: 92.7 fL (ref 79.5–101.0)
MONO#: 1.1 10*3/uL — ABNORMAL HIGH (ref 0.1–0.9)
NEUT#: 11 10*3/uL — ABNORMAL HIGH (ref 1.5–6.5)
NEUT%: 80.2 % — ABNORMAL HIGH (ref 38.4–76.8)
RBC: 3.82 10*6/uL (ref 3.70–5.45)
RDW: 13.5 % (ref 11.2–14.5)
WBC: 13.8 10*3/uL — ABNORMAL HIGH (ref 3.9–10.3)
lymph#: 1.6 10*3/uL (ref 0.9–3.3)

## 2013-05-28 NOTE — Progress Notes (Signed)
Patient ID: Janet [name], female   DOB: 1955/09/02, 57 y.o.   MRN: [id number] ID: Janet [name] OB: 1956-06-05  MR#: [id number]  NWG#:[id number]  PCP: Astrid Divine, MD GYN:   SU: Cicero Duck OTHER MD: Chipper Herb, Maryelizabeth Kaufmann  CHIEF COMPLAINT:  Right Breast Cancer   HISTORY OF PRESENT ILLNESS: "[name]" herself noted a lump in her right breast on 03/17/2013, and immediately contacted her physician so that on 03/18/2013 bilateral diagnostic mammography and right breast ultrasonography at the breast Center showed a developing mass in the subareolar region of the right breast. There were no malignant type calcifications. This mass was palpable in the upper outer quadrant, and by ultrasound measured 2.6 cm. Ultrasonography of the right axilla showed a lymph node measuring 1.1 cm, with a thickened cortex. The left breast was unremarkable.  Biopsy of the right breast mass 08/26//2014 showed ([name]) and invasive ductal carcinoma, grade 3, triple negative, with an MIB-1 of 54%. Biopsy of the right axillary lymph node in question at the same time was negative.  Bilateral breast MRI 03/26/2013 measured the right subareolar mass at 3.8 cm. The previously biopsied benign right axillary lymph node was again noted. There were no other suspicious areas in the right breast or axilla and the left breast was unremarkable.  Her subsequent history is as detailed below  INTERVAL HISTORY: [name] returns alone today for followup of her locally advanced right breast cancer. She is being treated in the neoadjuvant setting and is due for day 1 cycle 4 of 4 planned dose dense cycles of doxorubicin/cyclophosphamide tomorrow, 05/29/2013.  She receives Neulasta on day 2 for granulocyte support.  [name] was treated last week for an upper respiratory infection. She completed a Z-Pak, but while she was taking the antibiotic, she developed a red, itchy rash on her trunk, especially on her back. She  completed the antibiotic, but the rash remains. It has not gotten worse, but is improving very slowly. Fortunately, however, the upper respiratory infection seems to have cleared. Her voice has returned, she has less nasal congestion, and the cough has improved significantly. She's had no fevers or chills.   REVIEW OF SYSTEMS: In fact, [name] continues to be very active, and is still working full time and exercising daily.  She does tell me her energy level is "not like it usually is".   She's had no rashes or skin changes. She denies any signs of abnormal bleeding. Her appetite is fairly good, and she's had no nausea, emesis, or change in bowel or bladder habits. She's had no oral ulcerations and denies any current oral sensitivity or pharyngitis.  She's had no increased shortness of breath and denies orthopnea. She currently denies any unusual myalgias, arthralgias, or bony pain, and has had no peripheral swelling. She has occasional headaches, but currently these are well-controlled, and she denies any dizziness or change in vision.  A detailed review of systems is otherwise stable and noncontributory.     PAST MEDICAL HISTORY: Past Medical History  Diagnosis Date  . Migraines   . Cancer     breast    PAST SURGICAL HISTORY: Past Surgical History  Procedure Laterality Date  . Removal of cataracts    . Foot surgery  age 42    bone graft  . Portacath placement N/A 04/04/2013    Procedure: INSERTION PORT-A-CATH;  Surgeon: Currie Paris, MD;  Location: [facility];  Service: General;  Laterality: N/A;    FAMILY HISTORY Family History  Problem Relation Age of Onset  . Pancreatic cancer Mother   . Breast cancer Maternal Grandmother    the patient's father died at the age of 82 with pancreatic cancer. (He was Dr. Chrystine Oiler). [name] is alive at age 42. Sallye Ober has one brother, no sisters. [name]'s mother died from breast cancer at the age of 10. There is no  history of ovarian cancer in the family  GYNECOLOGIC HISTORY:  Menarche age 79, first live birth age 77. She is GX P2. She stopped having periods approximately 2004. She did not take hormone replacement. She did take birth control pills remotely for 5-10 years, with no complications.  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 Her husband the [place]. ARoxan Hockey "Robby" [place], is a local judge. The patient's 2 children are Miley who lives in [place] and works as a Financial risk analyst, and [place], who works in [place] and is an associate with a Set designer group.  ADVANCED DIRECTIVES: In place   HEALTH MAINTENANCE: (Updated 05/21/2013) History  Substance Use Topics  . Smoking status: Never Smoker   . Smokeless tobacco: Never Used  . Alcohol Use: Yes     Comment: occasional     Colonoscopy:  PAP:  Oct 2014, Dr. Valentina Lucks  Bone density: July 2013 at [facility],    osteopenia  Lipid panel: UTD, Dr. Valentina Lucks   Allergies  Allergen Reactions  . Penicillins   . Azithromycin Rash    Current Outpatient Prescriptions  Medication Sig Dispense Refill  . acetaminophen (TYLENOL) 325 MG tablet Take 650 mg by mouth every 6 (six) hours as needed for pain.      Marland Kitchen Alum & Mag Hydroxide-Simeth (MAGIC MOUTHWASH W/LIDOCAINE) SOLN Take 5 mLs by mouth 4 (four) times daily as needed. For throat or mouth pain  240 mL  1  . dexamethasone (DECADRON) 4 MG tablet Take 2 tablets by mouth once a day on the day after chemotherapy and then take 2 tablets two times a day for 2 days. Take with food.  30 tablet  1  . lidocaine-prilocaine (EMLA) cream Apply topically as needed.  30 g  0  . LORazepam (ATIVAN) 0.5 MG tablet Take 1 tablet (0.5 mg total) by mouth every 6 (six) hours as needed  (Nausea or vomiting).  30 tablet  0  . ondansetron (ZOFRAN) 8 MG tablet Take 1 tablet (8 mg total) by mouth 2 (two) times daily as needed. Take two times a day as needed for nausea or vomiting starting on the third day after chemotherapy.  30 tablet  1  . prochlorperazine (COMPAZINE) 10 MG tablet Take 1 tablet (10 mg total) by mouth every 6 (six) hours as needed (Nausea or vomiting).  30 tablet  1  . SUMAtriptan (IMITREX) 50 MG tablet Take 1 tablet (50 mg total) by mouth every 2 (two) hours as needed for migraine. May repeat in 2 hours if headache persists or recurs.  10 tablet  0  . benzonatate (TESSALON) 100 MG capsule Take 1 capsule (100 mg total) by mouth 3 (three) times daily as needed for cough.  30 capsule  0  . CALCIUM PO Take by mouth daily.      Marland Kitchen HYDROcodone-acetaminophen (NORCO)  5-325 MG per tablet Take 1 tablet by mouth every 4 (four) hours as needed for pain.  30 tablet  0  . Multiple Vitamin (MULTI-VITAMIN DAILY PO) Take by mouth daily.      . polyethylene glycol powder (MIRALAX) powder Take 17 g by mouth daily.  255 g  0   No current facility-administered medications for this visit.   Facility-Administered Medications Ordered in Other Visits  Medication Dose Route Frequency Provider Last Rate Last Dose  . sodium chloride 0.9 % injection 10 mL  10 mL Intracatheter PRN Lowella Dell, MD   10 mL at 05/16/13 1111    OBJECTIVE: Middle-aged white woman  who is only able to speak in a whisper, but is in no acute distress  Filed Vitals:   05/28/13 1510  BP: 100/66  Pulse: 73  Temp: 98.3 F (36.8 C)  Resp: 18     Body mass index is 18.45 kg/(m^2).    ECOG FS:0 Filed Weights   05/28/13 1510  Weight: 121 lb 4.8 oz (55.021 kg)   Physical Exam: HEENT:  Sclerae anicteric.  Oropharynx clear. No ulcerations or evidence of candidiasis.  NODES:  No cervical or supraclavicular lymphadenopathy palpated.  BREAST EXAM:  Deferred. Axillae are benign bilaterally with no palpable  adenopathy LUNGS:  Clear to auscultation bilaterally.  No wheezes or rhonchi. Marland Kitchen HEART:  Regular rate and rhythm. No murmur SKIN: Port is intact in the left upper chest wall with no erythema or edema, no evidence of cellulitis or infection. There is a fine erythematous papular rash on the trunk, greater on the back than the chest or abdomen. There no pustules or vesicles noted. The skin is extremely dry. ABDOMEN:  Soft, nontender to palpation.  Positive bowel sounds. No organomegaly palpated. MSK:  No focal spinal tenderness to palpation. Full range of motion bilaterally in the upper extremities. EXTREMITIES:  No peripheral edema.   NEURO:  Nonfocal. Well oriented.  Positive affect.    LAB RESULTS:  CMP     Component Value Date/Time   NA 140 05/14/2013 1349   K 4.5 05/14/2013 1349   CO2 28 05/14/2013 1349   GLUCOSE 72 05/14/2013 1349   BUN 14.2 05/14/2013 1349   CREATININE 1.0 05/14/2013 1349   CALCIUM 10.5* 05/14/2013 1349   PROT 6.4 05/14/2013 1349   ALBUMIN 3.7 05/14/2013 1349   AST 21 05/14/2013 1349   ALT 41 05/14/2013 1349   ALKPHOS 106 05/14/2013 1349   BILITOT 0.20 05/14/2013 1349     Lab Results  Component Value Date   WBC 13.8* 05/28/2013   NEUTROABS 11.0* 05/28/2013   HGB 11.8 05/28/2013   HCT 35.4 05/28/2013   MCV 92.7 05/28/2013   PLT 173 05/28/2013      STUDIES:   No recent studies.    ASSESSMENT: 57 y.o. [place] woman   (1)  status post right breast biopsy 03/19/2013 for a clinical T2 N0, stage IIA invasive ductal breast cancer, grade 3, triple negative, with an MIB-1 of 54%.  (2) suspicious right axillary lymph node biopsy negative  (3) started dose dense doxorubicin/ cyclophosphamide 04/17/2013, at standard doses, with Neulasta support; 4 cycles planned to be followed by 12 weekly doses of carboplatin/paclitaxel prior to definitive surgery   PLAN: [name] will return tomorrow as scheduled, November 5, for her fourth and final dose of  neoadjuvant doxorubicin/cyclophosphamide. She will have her Neulasta injection on day 2 as usual.  [name] is already scheduled for a repeat breast MRI next  week on November 11 to assess response to chemotherapy thus far. She'll see both Dr. Darnelle Catalan and Dr. Jamey Ripa on November 13 to review those results and discuss her breast surgery. I will see her the following week on November 18 for repeat labs and physical exam prior to initiating her first of 12 planned weekly doses of carboplatin/paclitaxel.   Typically, I would suggest a Medrol Dosepak for what looks like a drug associated rash. However, [name] is getting ready to start on the dexamethasone with chemo tomorrow. I have encouraged her to take one dose tonight. She'll receive IV dexamethasone tomorrow, and will take oral dexamethasone the next 3 days. She can also apply topical hydrocortisone cream up to twice daily for the itching. If this does not resolve by the end of the week I have asked her to let us know, and certainly at that point we would consider beginning a Medrol Dosepak.  She voices understanding and agreement with this plan today, and knows to call with any changes or problems prior to her next appointment.     Tali Cleaves, PA-C   05/28/2013 3:42 PM

## 2013-05-29 ENCOUNTER — Ambulatory Visit: Payer: BC Managed Care – PPO | Admitting: Physician Assistant

## 2013-05-29 ENCOUNTER — Ambulatory Visit (HOSPITAL_BASED_OUTPATIENT_CLINIC_OR_DEPARTMENT_OTHER): Payer: BC Managed Care – PPO

## 2013-05-29 ENCOUNTER — Other Ambulatory Visit: Payer: BC Managed Care – PPO | Admitting: Lab

## 2013-05-29 DIAGNOSIS — Z5111 Encounter for antineoplastic chemotherapy: Secondary | ICD-10-CM

## 2013-05-29 DIAGNOSIS — C50411 Malignant neoplasm of upper-outer quadrant of right female breast: Secondary | ICD-10-CM

## 2013-05-29 DIAGNOSIS — C50419 Malignant neoplasm of upper-outer quadrant of unspecified female breast: Secondary | ICD-10-CM

## 2013-05-29 MED ORDER — PALONOSETRON HCL INJECTION 0.25 MG/5ML
0.2500 mg | Freq: Once | INTRAVENOUS | Status: AC
  Administered 2013-05-29: 0.25 mg via INTRAVENOUS

## 2013-05-29 MED ORDER — DOXORUBICIN HCL CHEMO IV INJECTION 2 MG/ML
60.0000 mg/m2 | Freq: Once | INTRAVENOUS | Status: AC
  Administered 2013-05-29: 98 mg via INTRAVENOUS
  Filled 2013-05-29: qty 49

## 2013-05-29 MED ORDER — SODIUM CHLORIDE 0.9 % IJ SOLN
10.0000 mL | INTRAMUSCULAR | Status: DC | PRN
  Administered 2013-05-29: 10 mL
  Filled 2013-05-29: qty 10

## 2013-05-29 MED ORDER — SODIUM CHLORIDE 0.9 % IV SOLN
150.0000 mg | Freq: Once | INTRAVENOUS | Status: AC
  Administered 2013-05-29: 150 mg via INTRAVENOUS
  Filled 2013-05-29: qty 5

## 2013-05-29 MED ORDER — SODIUM CHLORIDE 0.9 % IV SOLN
Freq: Once | INTRAVENOUS | Status: AC
  Administered 2013-05-29: 16:00:00 via INTRAVENOUS

## 2013-05-29 MED ORDER — DEXAMETHASONE SODIUM PHOSPHATE 20 MG/5ML IJ SOLN
INTRAMUSCULAR | Status: AC
  Filled 2013-05-29: qty 5

## 2013-05-29 MED ORDER — CYCLOPHOSPHAMIDE CHEMO INJECTION 1 GM
600.0000 mg/m2 | Freq: Once | INTRAMUSCULAR | Status: AC
  Administered 2013-05-29: 980 mg via INTRAVENOUS
  Filled 2013-05-29: qty 49

## 2013-05-29 MED ORDER — DEXAMETHASONE SODIUM PHOSPHATE 20 MG/5ML IJ SOLN
12.0000 mg | Freq: Once | INTRAMUSCULAR | Status: AC
  Administered 2013-05-29: 12 mg via INTRAVENOUS

## 2013-05-29 MED ORDER — PALONOSETRON HCL INJECTION 0.25 MG/5ML
INTRAVENOUS | Status: AC
  Filled 2013-05-29: qty 5

## 2013-05-29 MED ORDER — HEPARIN SOD (PORK) LOCK FLUSH 100 UNIT/ML IV SOLN
500.0000 [IU] | Freq: Once | INTRAVENOUS | Status: AC | PRN
  Administered 2013-05-29: 500 [IU]
  Filled 2013-05-29: qty 5

## 2013-05-29 NOTE — Patient Instructions (Signed)
[facility] Discharge Instructions for Patients Receiving Chemotherapy  Today you received the following chemotherapy agents: Cytoxan, Adriamycin.  To help prevent nausea and vomiting after your treatment, we encourage you to take your nausea medication as prescribed.    If you develop nausea and vomiting that is not controlled by your nausea medication, call the clinic.   BELOW ARE SYMPTOMS THAT SHOULD BE REPORTED IMMEDIATELY:  *FEVER GREATER THAN 100.5 F  *CHILLS WITH OR WITHOUT FEVER  NAUSEA AND VOMITING THAT IS NOT CONTROLLED WITH YOUR NAUSEA MEDICATION  *UNUSUAL SHORTNESS OF BREATH  *UNUSUAL BRUISING OR BLEEDING  TENDERNESS IN MOUTH AND THROAT WITH OR WITHOUT PRESENCE OF ULCERS  *URINARY PROBLEMS  *BOWEL PROBLEMS  UNUSUAL RASH Items with * indicate a potential emergency and should be followed up as soon as possible.  Feel free to call the clinic you have any questions or concerns. The clinic phone number is [phone].

## 2013-05-30 ENCOUNTER — Ambulatory Visit (HOSPITAL_BASED_OUTPATIENT_CLINIC_OR_DEPARTMENT_OTHER): Payer: BC Managed Care – PPO

## 2013-05-30 ENCOUNTER — Other Ambulatory Visit: Payer: Self-pay

## 2013-05-30 VITALS — BP 110/50 | HR 69 | Temp 96.7°F | Resp 18

## 2013-05-30 DIAGNOSIS — C50419 Malignant neoplasm of upper-outer quadrant of unspecified female breast: Secondary | ICD-10-CM

## 2013-05-30 DIAGNOSIS — C50411 Malignant neoplasm of upper-outer quadrant of right female breast: Secondary | ICD-10-CM

## 2013-05-30 DIAGNOSIS — Z5189 Encounter for other specified aftercare: Secondary | ICD-10-CM

## 2013-05-30 MED ORDER — PEGFILGRASTIM INJECTION 6 MG/0.6ML
6.0000 mg | Freq: Once | SUBCUTANEOUS | Status: AC
  Administered 2013-05-30: 6 mg via SUBCUTANEOUS
  Filled 2013-05-30: qty 0.6

## 2013-05-30 NOTE — Patient Instructions (Signed)

## 2013-06-04 ENCOUNTER — Ambulatory Visit (HOSPITAL_COMMUNITY)
Admission: RE | Admit: 2013-06-04 | Discharge: 2013-06-04 | Disposition: A | Discharge status: Home / Self Care | Payer: BC Managed Care – PPO | Source: Ambulatory Visit | Attending: Physician Assistant | Admitting: Physician Assistant

## 2013-06-04 DIAGNOSIS — C50419 Malignant neoplasm of upper-outer quadrant of unspecified female breast: Secondary | ICD-10-CM | POA: Insufficient documentation

## 2013-06-04 DIAGNOSIS — Z79899 Other long term (current) drug therapy: Secondary | ICD-10-CM | POA: Insufficient documentation

## 2013-06-04 DIAGNOSIS — C50411 Malignant neoplasm of upper-outer quadrant of right female breast: Secondary | ICD-10-CM

## 2013-06-04 MED ORDER — GADOBENATE DIMEGLUMINE 529 MG/ML IV SOLN
11.0000 mL | Freq: Once | INTRAVENOUS | Status: AC | PRN
  Administered 2013-06-04: 11 mL via INTRAVENOUS

## 2013-06-06 ENCOUNTER — Encounter (INDEPENDENT_AMBULATORY_CARE_PROVIDER_SITE_OTHER): Payer: BC Managed Care – PPO | Admitting: Surgery

## 2013-06-06 ENCOUNTER — Ambulatory Visit: Payer: BC Managed Care – PPO | Admitting: Oncology

## 2013-06-06 ENCOUNTER — Other Ambulatory Visit (HOSPITAL_BASED_OUTPATIENT_CLINIC_OR_DEPARTMENT_OTHER): Payer: BC Managed Care – PPO | Admitting: Lab

## 2013-06-06 ENCOUNTER — Ambulatory Visit (HOSPITAL_BASED_OUTPATIENT_CLINIC_OR_DEPARTMENT_OTHER): Payer: BC Managed Care – PPO | Admitting: Oncology

## 2013-06-06 ENCOUNTER — Other Ambulatory Visit: Payer: BC Managed Care – PPO | Admitting: Lab

## 2013-06-06 VITALS — BP 101/66 | HR 78 | Temp 98.2°F | Resp 18 | Ht 68.0 in | Wt 122.6 lb

## 2013-06-06 DIAGNOSIS — C50419 Malignant neoplasm of upper-outer quadrant of unspecified female breast: Secondary | ICD-10-CM

## 2013-06-06 DIAGNOSIS — C50411 Malignant neoplasm of upper-outer quadrant of right female breast: Secondary | ICD-10-CM

## 2013-06-06 DIAGNOSIS — C50919 Malignant neoplasm of unspecified site of unspecified female breast: Secondary | ICD-10-CM

## 2013-06-06 DIAGNOSIS — Z171 Estrogen receptor negative status [ER-]: Secondary | ICD-10-CM

## 2013-06-06 LAB — COMPREHENSIVE METABOLIC PANEL (CC13)
ALT: 33 U/L (ref 0–55)
Albumin: 3.6 g/dL (ref 3.5–5.0)
Alkaline Phosphatase: 104 U/L (ref 40–150)
Anion Gap: 7 mEq/L (ref 3–11)
BUN: 9.6 mg/dL (ref 7.0–26.0)
CO2: 25 mEq/L (ref 22–29)
Glucose: 104 mg/dl (ref 70–140)
Sodium: 139 mEq/L (ref 136–145)
Total Bilirubin: 0.32 mg/dL (ref 0.20–1.20)
Total Protein: 5.8 g/dL — ABNORMAL LOW (ref 6.4–8.3)

## 2013-06-06 LAB — CBC WITH DIFFERENTIAL/PLATELET
Basophils Absolute: 0 10*3/uL (ref 0.0–0.1)
Eosinophils Absolute: 0 10*3/uL (ref 0.0–0.5)
HCT: 29.8 % — ABNORMAL LOW (ref 34.8–46.6)
HGB: 10.1 g/dL — ABNORMAL LOW (ref 11.6–15.9)
LYMPH%: 30.7 % (ref 14.0–49.7)
MCV: 93 fL (ref 79.5–101.0)
MONO%: 21.1 % — ABNORMAL HIGH (ref 0.0–14.0)
NEUT#: 0.9 10*3/uL — ABNORMAL LOW (ref 1.5–6.5)
NEUT%: 45.7 % (ref 38.4–76.8)
Platelets: 105 10*3/uL — ABNORMAL LOW (ref 145–400)
WBC: 2 10*3/uL — ABNORMAL LOW (ref 3.9–10.3)
lymph#: 0.6 10*3/uL — ABNORMAL LOW (ref 0.9–3.3)

## 2013-06-06 MED ORDER — SUMATRIPTAN SUCCINATE 50 MG PO TABS: 50.0000 mg | ORAL_TABLET | ORAL | Status: DC | PRN

## 2013-06-06 NOTE — Progress Notes (Signed)
Patient ID: Janet [name], female   DOB: [birth year], 57 y.o.   MRN: [id number] ID: Janet [name] OB: Mar 02, 1956  MR#: [id number]  NGE#:[id number]  PCP: Janet Divine, MD GYN:   Janet RiggersCicero [name]; [place] [name] OTHER MD: Janet [name], Janet [name]  CHIEF COMPLAINT:  Right Breast Cancer   HISTORY OF PRESENT ILLNESS: "Janet [name]" herself noted a lump in her right breast on 03/17/2013, and immediately contacted her physician so that on 03/18/2013 bilateral diagnostic mammography and right breast ultrasonography at the breast Center showed a developing mass in the subareolar region of the right breast. There were no malignant type calcifications. This mass was palpable in the upper outer quadrant, and by ultrasound measured 2.6 cm. Ultrasonography of the right axilla showed a lymph node measuring 1.1 cm, with a thickened cortex. The left breast was unremarkable.  Biopsy of the right breast mass 08/26//2014 showed ([name]) and invasive ductal carcinoma, grade 3, triple negative, with an MIB-1 of 54%. Biopsy of the right axillary lymph node in question at the same time was negative.  Bilateral breast MRI 03/26/2013 measured the right subareolar mass at 3.8 cm. The previously biopsied benign right axillary lymph node was again noted. There were no other suspicious areas in the right breast or axilla and the left breast was unremarkable.  Her subsequent history is as detailed below  INTERVAL HISTORY: Janet [name] returns alone today for followup of her locally advanced right breast cancer. Today is day 9 cycle 4 of her doxorubicin/ cyclophosphamide therapy. She is already to start her weekly carboplatin/paclitaxel treatments. Since her last visit here she was restaged with an MRI of the breast which has a very favorable response   REVIEW OF SYSTEMS: Janet [name] never had a significant problems with nausea or vomiting. 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 She  does not peeling of her fingertips and they are tender, not now. She also gets migraines with the treatments and requested a refill on her migraine medication. Otherwise a detailed review of systems today was noncontributory  PAST MEDICAL HISTORY: Past Medical History  Diagnosis Date  . Migraines   . Cancer     breast    PAST SURGICAL HISTORY: Past Surgical History  Procedure Laterality Date  . Removal of cataracts    . Foot surgery  age 4    bone graft  . Portacath placement N/A 04/04/2013    Procedure: INSERTION PORT-A-CATH;  Surgeon: Janet Paris, MD;  Location: [facility];  Service: General;  Laterality: N/A;    FAMILY HISTORY Family History  Problem Relation Age of Onset  . Pancreatic cancer Mother   . Breast cancer Maternal Grandmother    the patient's father died at the age of 37 with pancreatic cancer. (He was Janet [name]). [name] is alive at age 27. Janet [name] has one brother, no sisters. [name]'s mother died from breast cancer at the age of 65. There is no history of ovarian cancer in the family  GYNECOLOGIC HISTORY:  Menarche age 27, first live birth age 54. She is GX P2. She stopped having periods approximately 2004. She did not take hormone replacement. She did take birth control pills remotely for 5-10 years, with no complications.  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 Her husband the [place]. ARoxan [name] "Janet" [name], is a local judge. The patient's 2 children are Janet [name] who lives in [place] and works as a Financial risk analyst, and Janet [name], who works in [place] and is an associate with a Set designer group.  ADVANCED DIRECTIVES: In place   HEALTH MAINTENANCE: (Updated 05/21/2013) History  Substance Use  Topics  . Smoking status: Never Smoker   . Smokeless tobacco: Never Used  . Alcohol Use: Yes     Comment: occasional     Colonoscopy:  PAP:  Oct 2014, Dr. Valentina Lucks  Bone density: July 2013 at [facility],    osteopenia  Lipid panel: UTD, Dr. Valentina Lucks   Allergies  Allergen Reactions  . Penicillins   . Azithromycin Rash    Current Outpatient Prescriptions  Medication Sig Dispense Refill  . acetaminophen (TYLENOL) 325 MG tablet Take 650 mg by mouth every 6 (six) hours as needed for pain.      Marland Kitchen Alum & Mag Hydroxide-Simeth (MAGIC MOUTHWASH W/LIDOCAINE) SOLN Take 5 mLs by mouth 4 (four) times daily as needed. For throat or mouth pain  240 mL  1  . benzonatate (TESSALON) 100 MG capsule Take 1 capsule (100 mg total) by mouth 3 (three) times daily as needed for cough.  30 capsule  0  . CALCIUM PO Take by mouth daily.      Marland Kitchen dexamethasone (DECADRON) 4 MG tablet Take 2 tablets by mouth once a day on the day after chemotherapy and then take 2 tablets two times a day for 2 days. Take with food.  30 tablet  1  . HYDROcodone-acetaminophen (NORCO) 5-325 MG per tablet Take 1 tablet by mouth every 4 (four) hours as needed for pain.  30 tablet  0  . lidocaine-prilocaine (EMLA) cream Apply topically as needed.  30 g  0  . LORazepam (ATIVAN) 0.5 MG tablet Take 1 tablet (0.5 mg total) by mouth every 6 (six) hours as needed (Nausea or vomiting).  30 tablet  0  . Multiple Vitamin (MULTI-VITAMIN DAILY PO) Take by mouth daily.      . ondansetron (ZOFRAN) 8 MG tablet Take 1 tablet (8 mg total) by mouth 2 (two) times daily as needed. Take two times a day as needed for nausea or vomiting starting on the third day after chemotherapy.  30 tablet  1  . polyethylene glycol powder (MIRALAX) powder Take 17 g by mouth daily.  255 g  0  . prochlorperazine (COMPAZINE) 10 MG tablet Take 1 tablet (10 mg total) by mouth every 6 (six) hours as needed (Nausea or vomiting).  30 tablet  1  . SUMAtriptan (IMITREX) 50 MG  tablet Take 1 tablet (50 mg total) by mouth every 2 (two) hours as needed for migraine. May repeat in 2 hours if headache persists or recurs.  10 tablet  0   No current facility-administered medications for this visit.   Facility-Administered Medications Ordered in Other Visits  Medication Dose Route Frequency Provider Last Rate Last Dose  . sodium chloride 0.9 % injection 10 mL  10 mL Intracatheter PRN Lowella Dell, MD   10 mL at 05/16/13 1111    OBJECTIVE: Middle-aged white woman in no acute distress  Filed Vitals:   06/06/13 1147  BP: 101/66  Pulse: 78  Temp: 98.2 F (36.8 C)  Resp: 18     Body mass index is 18.65 kg/(m^2).    ECOG FS:1 [employer]  06/06/13 1147  Weight: 122 lb 9.6 oz (55.611 kg)   Sclerae unicteric Oropharynx clear No cervical or supraclavicular adenopathy Lungs no rales or rhonchi Heart regular rate and rhythm Abd soft, nontender, positive bowel sounds MSK no focal spinal tenderness, no peripheral edema Neuro: nonfocal, well oriented, ebullient affect Breasts: I can still feel a mass in the upper inner quadrant of the right breast. It is easily movable, and nontender. The right axilla is benign. Left breast is unremarkable  LAB RESULTS:  CMP     Component Value Date/Time   NA 141 05/28/2013 1457   K 3.8 05/28/2013 1457   CO2 27 05/28/2013 1457   GLUCOSE 70 05/28/2013 1457   BUN 11.9 05/28/2013 1457   CREATININE 0.8 05/28/2013 1457   CALCIUM 10.9* 05/28/2013 1457   PROT 6.3* 05/28/2013 1457   ALBUMIN 3.8 05/28/2013 1457   AST 23 05/28/2013 1457   ALT 37 05/28/2013 1457   ALKPHOS 106 05/28/2013 1457   BILITOT <0.20 05/28/2013 1457     Lab Results  Component Value Date   WBC 2.0* 06/06/2013   NEUTROABS 0.9* 06/06/2013   HGB 10.1* 06/06/2013   HCT 29.8* 06/06/2013   MCV 93.0 06/06/2013   PLT 105* 06/06/2013      STUDIES:  Mr Breast Bilateral W Wo Contrast  06/05/2013   CLINICAL DATA:  Patient with right breast carcinoma being treated  with neoadjuvant chemotherapy. Re-evaluate tumor size.  EXAM: MR BILATERAL BREAST WITHOUT AND WITH CONTRAST  TECHNIQUE: Multiplanar, multisequence MR images of both breasts were obtained prior to and following the intravenous administration of 11ml of MultiHance.  THREE-DIMENSIONAL MR IMAGE RENDERING ON INDEPENDENT WORKSTATION:  Three-dimensional MR images were rendered by post-processing of the original MR data on an independent workstation. The three-dimensional MR images were interpreted, and findings are reported in the following complete MRI report for this study.  COMPARISON:  Previous exams  FINDINGS: Breast composition: d. Extreme fibroglandular tissue  Background parenchymal enhancement: Moderate  Right breast: Very minimal residual punctate enhancement is demonstrated within the subareolar region of the right breast at the site of biopsied right breast malignancy. Scattered foci of enhancement within the right breast.  Left breast: No mass or abnormal enhancement.  Lymph nodes: No abnormal appearing lymph nodes.  Ancillary findings:  None.  IMPRESSION: Very minimal residual punctate enhancement is demonstrated at the site of previously biopsied right breast malignancy, compatible with treatment response.  RECOMMENDATION: Treatment plan.  BI-RADS CATEGORY  6: Known biopsy-proven malignancy - appropriate action should be taken.   Electronically Signed   By: Annia Belt M.D.   On: 06/05/2013 16:42    ASSESSMENT: 57 y.o. [place] woman   (1)  status post right breast biopsy 03/19/2013 for a clinical T2 N0, stage IIA invasive ductal breast cancer, grade 3, triple negative, with an MIB-1 of 54%.  (2) suspicious right axillary lymph node biopsy negative  (3) received four cycles of dose dense doxorubicin/ cyclophosphamide between 04/17/2013 and 05/29/2013 , at standard doses, with Neulasta support;  (4) on  06/12/2013 starting 12 weekly doses of carboplatin/ paclitaxel; will skip the Christmas week  dose (add at the end)   (5) anticipate definitive surgery 09/30/2013 (Spring Break week)   PLAN: Janet [name] is having an excellent response to treatment and I am hopeful we will achieve a complete pathologic response when she gets to surgery. She has a good understanding of the possible toxicities, side effects and complications of carboplatin and paclitaxel. 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 We are not going to treat December 23 or 24--we will add that dose at the end, which means her last dose, if there are no other delays, will be February 11, which will be 3 and half weeks before the date she hopes to have her surgery, March 9th.  She has lost a little weight and I suggested she get a frappe and Fridays at [employer] twice a week, to see if we can take up 2 or 3 pounds over the next 6 weeks. Of course the holidays are coming up and that should help as well.  She tells me Dr. Jamey Ripa has assigned her to [place] [name] for definitive surgery.  I went ahead and entered the orders for her treatments. She knows to call for any problems that may develop before her next visit here.    Lowella Dell, MD   06/06/2013 11:55 AM

## 2013-06-10 ENCOUNTER — Encounter: Payer: Self-pay | Admitting: *Deleted

## 2013-06-10 NOTE — Progress Notes (Signed)
Mailed after appt letter to pt. 

## 2013-06-11 ENCOUNTER — Encounter: Payer: Self-pay | Admitting: Physician Assistant

## 2013-06-11 ENCOUNTER — Ambulatory Visit (HOSPITAL_BASED_OUTPATIENT_CLINIC_OR_DEPARTMENT_OTHER): Payer: BC Managed Care – PPO | Admitting: Physician Assistant

## 2013-06-11 ENCOUNTER — Other Ambulatory Visit (HOSPITAL_BASED_OUTPATIENT_CLINIC_OR_DEPARTMENT_OTHER): Payer: BC Managed Care – PPO | Admitting: Lab

## 2013-06-11 VITALS — BP 128/74 | HR 68 | Temp 97.2°F | Resp 18 | Ht 68.0 in | Wt 121.2 lb

## 2013-06-11 DIAGNOSIS — G43909 Migraine, unspecified, not intractable, without status migrainosus: Secondary | ICD-10-CM

## 2013-06-11 DIAGNOSIS — C50919 Malignant neoplasm of unspecified site of unspecified female breast: Secondary | ICD-10-CM

## 2013-06-11 DIAGNOSIS — R21 Rash and other nonspecific skin eruption: Secondary | ICD-10-CM

## 2013-06-11 DIAGNOSIS — C50411 Malignant neoplasm of upper-outer quadrant of right female breast: Secondary | ICD-10-CM

## 2013-06-11 DIAGNOSIS — C50419 Malignant neoplasm of upper-outer quadrant of unspecified female breast: Secondary | ICD-10-CM

## 2013-06-11 DIAGNOSIS — Z171 Estrogen receptor negative status [ER-]: Secondary | ICD-10-CM

## 2013-06-11 LAB — CBC WITH DIFFERENTIAL/PLATELET
BASO%: 0.3 % (ref 0.0–2.0)
Basophils Absolute: 0 10*3/uL (ref 0.0–0.1)
EOS%: 0.2 % (ref 0.0–7.0)
HCT: 36 % (ref 34.8–46.6)
HGB: 12 g/dL (ref 11.6–15.9)
LYMPH%: 12.6 % — ABNORMAL LOW (ref 14.0–49.7)
MCH: 31.2 pg (ref 25.1–34.0)
MCHC: 33.2 g/dL (ref 31.5–36.0)
MONO#: 0.9 10*3/uL (ref 0.1–0.9)
NEUT%: 77.1 % — ABNORMAL HIGH (ref 38.4–76.8)
Platelets: 157 10*3/uL (ref 145–400)
RBC: 3.83 10*6/uL (ref 3.70–5.45)
WBC: 8.7 10*3/uL (ref 3.9–10.3)
lymph#: 1.1 10*3/uL (ref 0.9–3.3)

## 2013-06-11 MED ORDER — METHYLPREDNISOLONE (PAK) 4 MG PO TABS: ORAL_TABLET | ORAL | Status: DC

## 2013-06-11 MED ORDER — SUMATRIPTAN SUCCINATE 50 MG PO TABS: 50.0000 mg | ORAL_TABLET | ORAL | Status: DC | PRN

## 2013-06-11 NOTE — Progress Notes (Signed)
Patient ID: Janet [name], female   DOB: Mar 30, 1956, 57 y.o.   MRN: [id number] ID: Janet [name] OB: 1956/04/11  MR#: [id number]  HQI#:[id number]  PCP: Janet Divine, MD GYN:   Janet [name]; Janet [name] OTHER MD: Janet [name], Janet [name]  CHIEF COMPLAINT:  Right Breast Cancer   HISTORY OF PRESENT ILLNESS: "Janet [name]" herself noted a lump in her right breast on 03/17/2013, and immediately contacted her physician so that on 03/18/2013 bilateral diagnostic mammography and right breast ultrasonography at the breast Center showed a developing mass in the subareolar region of the right breast. There were no malignant type calcifications. This mass was palpable in the upper outer quadrant, and by ultrasound measured 2.6 cm. Ultrasonography of the right axilla showed a lymph node measuring 1.1 cm, with a thickened cortex. The left breast was unremarkable.  Biopsy of the right breast mass 08/26//2014 showed ([name]) and invasive ductal carcinoma, grade 3, triple negative, with an MIB-1 of 54%. Biopsy of the right axillary lymph node in question at the same time was negative.  Bilateral breast MRI 03/26/2013 measured the right subareolar mass at 3.8 cm. The previously biopsied benign right axillary lymph node was again noted. There were no other suspicious areas in the right breast or axilla and the left breast was unremarkable.  Her subsequent history is as detailed below  INTERVAL HISTORY: Janet [name] returns alone today for followup of her locally advanced right breast cancer. She has completed all 4 cycles of her doxorubicin/cyclophosphamide and had a very favorable response by MRI. She is now ready to discuss initiating her 12 weekly doses of carboplatin/paclitaxel, and is due for her first dose tomorrow, November 19.   Janet [name] herself is feeling well. She did have a few problems to discuss. She's having extreme dry mouth. The skin on her hands, especially her fingertips, is extremely  dry and cracking. There is no longer sore, and is slowly healing. She also continues to have an itchy rash on her back, and this has been present since taking the azithromycin several weeks ago. It is never completely resolved.  Janet [name] continues to have migraines which are a chronic problem. She takes Imitrex effectively, and requests a refill on the medication today.  REVIEW OF SYSTEMS: Janet [name] has had no fevers, chills, or night sweats. She's noted a very small amount of rectal bleeding, specifically a few spots of blood on the tissue following a bowel movement, and she attributes this to a small hemorrhoid. There's been no excessive bleeding, and no abnormal bleeding elsewhere. She's having regular bowel movements. She's had no problems at all with nausea or emesis. Her energy level is still remarkably good, and she continues to exercise daily and is working full-time. She currently has no cough, and has had no increased shortness of breath, chest pain, or palpitations. She's had no dizziness or change in vision. She also denies any new or unusual myalgias, arthralgias, or bony pain, and has had no peripheral swelling. At baseline, she also denies any signs of peripheral neuropathy. She does note, however, that due to the peeling on her fingertips she has had some problems over the past week or so with fine motor skills, but this is not associated with neuropathy.  Otherwise a detailed review of systems today was noncontributory  PAST MEDICAL HISTORY: Past Medical History  Diagnosis Date  . Migraines   . Cancer     breast  . Breast cancer   . Allergy     PAST  SURGICAL HISTORY: Past Surgical History  Procedure Laterality Date  . Removal of cataracts    . Foot surgery  age 17    bone graft  . Portacath placement N/A 04/04/2013    Procedure: INSERTION PORT-A-CATH;  Surgeon: Janet Paris, MD;  Location: [facility];  Service: General;  Laterality: N/A;    FAMILY HISTORY Family History   Problem Relation Age of Onset  . Pancreatic cancer Mother   . Breast cancer Maternal Grandmother    the patient's father died at the age of 57 with pancreatic cancer. (He was Dr. Chrystine [name]). [name] is alive at age 55. Janet [name] has one brother, no sisters. [name]'s mother died from breast cancer at the age of 75. There is no history of ovarian cancer in the family  GYNECOLOGIC HISTORY:  Menarche age 28, first live birth age 78. She is GX P2. She stopped having periods approximately 2004. She did not take hormone replacement. She did take birth control pills remotely for 5-10 years, with no complications.  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 Her husband the [place] [name]. ARoxan Hockey "Janet" [name], is a local judge. The patient's 2 children are Janet [name] who lives in [name] and works as a Financial risk analyst, and Janet [name], who works in [name] and is an associate with a Set designer group.  ADVANCED DIRECTIVES: In place   HEALTH MAINTENANCE: (Updated 06/11/2013) History  Substance Use Topics  . Smoking status: Never Smoker   . Smokeless tobacco: Never Used  . Alcohol Use: Yes     Comment: occasional     Colonoscopy: Not on file  PAP:  Oct 2014, Janet [name]  Bone density: July 2013 at [facility],    osteopenia  Lipid panel: UTD, Janet [name]   Allergies  Allergen Reactions  . Penicillins   . Azithromycin Rash    Current Outpatient Prescriptions  Medication Sig Dispense Refill  . acetaminophen (TYLENOL) 325 MG tablet Take 650 mg by mouth every 6 (six) hours as needed for pain.      Marland Kitchen Alum & Mag Hydroxide-Simeth (MAGIC MOUTHWASH W/LIDOCAINE) SOLN Take 5 mLs by mouth 4 (four) times daily as needed. For throat or mouth pain   240 mL  1  . HYDROcodone-acetaminophen (NORCO) 5-325 MG per tablet Take 1 tablet by mouth every 4 (four) hours as needed for pain.  30 tablet  0  . lidocaine-prilocaine (EMLA) cream Apply topically as needed.  30 g  0  . LORazepam (ATIVAN) 0.5 MG tablet       . Multiple Vitamin (MULTI-VITAMIN DAILY PO) Take by mouth daily.      . polyethylene glycol powder (MIRALAX) powder Take 17 g by mouth daily.  255 g  0  . prochlorperazine (COMPAZINE) 10 MG tablet Take 1 tablet (10 mg total) by mouth every 6 (six) hours as needed (Nausea or vomiting).  30 tablet  1  . SUMAtriptan (IMITREX) 50 MG tablet Take 1 tablet (50 mg total) by mouth every 2 (two) hours as needed for migraine. May repeat in 2 hours if headache persists or recurs.  10 tablet  0  . benzonatate (TESSALON) 100 MG capsule Take 1 capsule (100 mg total) by mouth 3 (three) times daily  as needed for cough.  30 capsule  0  . CALCIUM PO Take by mouth daily.      . methylPREDNIsolone (MEDROL DOSPACK) 4 MG tablet follow package directions  21 tablet  0   No current facility-administered medications for this visit.    OBJECTIVE: Middle-aged white woman in no acute distress  Filed Vitals:   06/11/13 1436  BP: 128/74  Pulse: 68  Temp: 97.2 F (36.2 C)  Resp: 18     Body mass index is 18.43 kg/(m^2).    ECOG FS:1 Filed Weights   06/11/13 1436  Weight: 121 lb 3.2 oz (54.976 kg)   Physical Exam: HEENT:  Sclerae anicteric.  Oropharynx clear. No evidence of ulcerations or candidiasis. NODES:  No cervical or supraclavicular lymphadenopathy palpated.  BREAST EXAM:  Deferred. Axillae are benign bilaterally, with no palpable lymphadenopathy. LUNGS:  Clear to auscultation bilaterally.  No wheezes or rhonchi. HEART:  Regular rate and rhythm. No murmur appreciated. ABDOMEN:  Soft, nontender.  Positive bowel sounds.  MSK:  No focal spinal tenderness to palpation. Good range of motion in the upper extremities. No joint swelling. EXTREMITIES:  No  peripheral edema.   SKIN:  There is a fine, papular, erythematous rash diffusely covering the back. There are no pustules or vesicles noted. NEURO:  Nonfocal. Well oriented.  Positive affect.   LAB RESULTS:  CMP     Component Value Date/Time   NA 139 06/06/2013 1137   K 4.1 06/06/2013 1137   CO2 25 06/06/2013 1137   GLUCOSE 104 06/06/2013 1137   BUN 9.6 06/06/2013 1137   CREATININE 0.7 06/06/2013 1137   CALCIUM 10.3 06/06/2013 1137   PROT 5.8* 06/06/2013 1137   ALBUMIN 3.6 06/06/2013 1137   AST 19 06/06/2013 1137   ALT 33 06/06/2013 1137   ALKPHOS 104 06/06/2013 1137   BILITOT 0.32 06/06/2013 1137     Lab Results  Component Value Date   WBC 8.7 06/11/2013   NEUTROABS 6.7* 06/11/2013   HGB 12.0 06/11/2013   HCT 36.0 06/11/2013   MCV 94.0 06/11/2013   PLT 157 06/11/2013      STUDIES:  Mr Breast Bilateral W Wo Contrast  06/05/2013   CLINICAL DATA:  Patient with right breast carcinoma being treated with neoadjuvant chemotherapy. Re-evaluate tumor size.  EXAM: MR BILATERAL BREAST WITHOUT AND WITH CONTRAST  TECHNIQUE: Multiplanar, multisequence MR images of both breasts were obtained prior to and following the intravenous administration of 11ml of MultiHance.  THREE-DIMENSIONAL MR IMAGE RENDERING ON INDEPENDENT WORKSTATION:  Three-dimensional MR images were rendered by post-processing of the original MR data on an independent workstation. The three-dimensional MR images were interpreted, and findings are reported in the following complete MRI report for this study.  COMPARISON:  Previous exams  FINDINGS: Breast composition: d. Extreme fibroglandular tissue  Background parenchymal enhancement: Moderate  Right breast: Very minimal residual punctate enhancement is demonstrated within the subareolar region of the right breast at the site of biopsied right breast malignancy. Scattered foci of enhancement within the right breast.  Left breast: No mass or abnormal enhancement.  Lymph  nodes: No abnormal appearing lymph nodes.  Ancillary findings:  None.  IMPRESSION: Very minimal residual punctate enhancement is demonstrated at the site of previously biopsied right breast malignancy, compatible with treatment response.  RECOMMENDATION: Treatment plan.  BI-RADS CATEGORY  6: Known biopsy-proven malignancy - appropriate action should be taken.   Electronically Signed   By: Annia Belt M.D.   On: 06/05/2013 16:42  ASSESSMENT: 57 y.o. [place] woman   (1)  status post right breast biopsy 03/19/2013 for a clinical T2 N0, stage IIA invasive ductal breast cancer, grade 3, triple negative, with an MIB-1 of 54%.  (2) suspicious right axillary lymph node biopsy negative  (3) received four cycles of dose dense doxorubicin/ cyclophosphamide between 04/17/2013 and 05/29/2013 , at standard doses, with Neulasta support;  (4) on  06/12/2013 starting 12 weekly doses of carboplatin/ paclitaxel; will skip the Christmas week dose (add at the end)   (5) anticipate definitive surgery 09/30/2013 (Spring Break week)   PLAN: Janet [name] is ready to return tomorrow, November 19, for her first of 12 planned weekly doses of neoadjuvant carboplatin/paclitaxel. We again reviewed possible side effects and toxicities associated with these medications, including the risk of peripheral neuropathy. We reviewed her antinausea regimen, and she was given written instructions on how to take her medications appropriately.   Because of her strong history of migraines, we will like to avoid ondansetron if possible. She will be using prochlorperazine, 10 mg, one tablet with meals and at bedtime for 2 days following each treatment. She also has lorazepam at home which she can take for any breakthrough nausea, and she also has dexamethasone which we can add if needed. If absolutely necessary, we will add in the ondansetron but we'll advise that she take it with Benadryl to decrease the risk of headache. We'll follow her very  closely, and I'm scheduled to see her again next week for her second weekly dose on November 25.  I have refilled Janet [name]'s Imitrex per her request. I'm also prescribing a Medrol Dosepak for what looks like a drug related allergic dermatitis on her back, apparently associated with the azithromycin. She'll let me know if this does not clear. She'll also continue to use A and D. ointment regularly on her hands, and hopefully the skin will continue to heal over the next week or so.  She voices understanding and agreement with the above plan, noticed called any changes or problems prior to her appointment with me next week.   Legend Tumminello, PA-C   06/11/2013 4:11 PM

## 2013-06-12 ENCOUNTER — Other Ambulatory Visit: Payer: Self-pay | Admitting: Physician Assistant

## 2013-06-12 ENCOUNTER — Other Ambulatory Visit: Payer: Self-pay | Admitting: Oncology

## 2013-06-12 ENCOUNTER — Ambulatory Visit (HOSPITAL_BASED_OUTPATIENT_CLINIC_OR_DEPARTMENT_OTHER): Payer: BC Managed Care – PPO

## 2013-06-12 VITALS — BP 113/68 | HR 65 | Temp 98.1°F

## 2013-06-12 DIAGNOSIS — C50419 Malignant neoplasm of upper-outer quadrant of unspecified female breast: Secondary | ICD-10-CM

## 2013-06-12 DIAGNOSIS — Z5111 Encounter for antineoplastic chemotherapy: Secondary | ICD-10-CM

## 2013-06-12 DIAGNOSIS — C50411 Malignant neoplasm of upper-outer quadrant of right female breast: Secondary | ICD-10-CM

## 2013-06-12 MED ORDER — ONDANSETRON 16 MG/50ML IVPB (CHCC)
INTRAVENOUS | Status: AC
  Filled 2013-06-12: qty 16

## 2013-06-12 MED ORDER — SODIUM CHLORIDE 0.9 % IJ SOLN
10.0000 mL | INTRAMUSCULAR | Status: DC | PRN
  Administered 2013-06-12: 10 mL
  Filled 2013-06-12: qty 10

## 2013-06-12 MED ORDER — DIPHENHYDRAMINE HCL 50 MG/ML IJ SOLN
25.0000 mg | Freq: Once | INTRAMUSCULAR | Status: AC
  Administered 2013-06-12: 25 mg via INTRAVENOUS

## 2013-06-12 MED ORDER — FAMOTIDINE IN NACL 20-0.9 MG/50ML-% IV SOLN
INTRAVENOUS | Status: AC
  Filled 2013-06-12: qty 50

## 2013-06-12 MED ORDER — SODIUM CHLORIDE 0.9 % IV SOLN
207.6000 mg | Freq: Once | INTRAVENOUS | Status: AC
  Administered 2013-06-12: 210 mg via INTRAVENOUS
  Filled 2013-06-12: qty 21

## 2013-06-12 MED ORDER — HEPARIN SOD (PORK) LOCK FLUSH 100 UNIT/ML IV SOLN
500.0000 [IU] | Freq: Once | INTRAVENOUS | Status: AC | PRN
  Administered 2013-06-12: 500 [IU]
  Filled 2013-06-12: qty 5

## 2013-06-12 MED ORDER — PACLITAXEL CHEMO INJECTION 300 MG/50ML
80.0000 mg/m2 | Freq: Once | INTRAVENOUS | Status: AC
  Administered 2013-06-12: 132 mg via INTRAVENOUS
  Filled 2013-06-12: qty 22

## 2013-06-12 MED ORDER — SODIUM CHLORIDE 0.9 % IV SOLN
Freq: Once | INTRAVENOUS | Status: AC
  Administered 2013-06-12: 14:00:00 via INTRAVENOUS

## 2013-06-12 MED ORDER — FAMOTIDINE IN NACL 20-0.9 MG/50ML-% IV SOLN
20.0000 mg | Freq: Once | INTRAVENOUS | Status: AC
  Administered 2013-06-12: 20 mg via INTRAVENOUS

## 2013-06-12 MED ORDER — DEXAMETHASONE SODIUM PHOSPHATE 20 MG/5ML IJ SOLN
INTRAMUSCULAR | Status: AC
  Filled 2013-06-12: qty 5

## 2013-06-12 MED ORDER — DEXAMETHASONE SODIUM PHOSPHATE 20 MG/5ML IJ SOLN
20.0000 mg | Freq: Once | INTRAMUSCULAR | Status: AC
  Administered 2013-06-12: 20 mg via INTRAVENOUS

## 2013-06-12 MED ORDER — ONDANSETRON 16 MG/50ML IVPB (CHCC)
16.0000 mg | Freq: Once | INTRAVENOUS | Status: AC
  Administered 2013-06-12: 16 mg via INTRAVENOUS

## 2013-06-12 MED ORDER — DIPHENHYDRAMINE HCL 50 MG/ML IJ SOLN
INTRAMUSCULAR | Status: AC
  Filled 2013-06-12: qty 1

## 2013-06-12 NOTE — Patient Instructions (Signed)
[facility] Discharge Instructions for Patients Receiving Chemotherapy  Today you received the following chemotherapy agents taxol, carboplatin  To help prevent nausea and vomiting after your treatment, we encourage you to take your nausea medication as needed   If you develop nausea and vomiting that is not controlled by your nausea medication, call the clinic.   BELOW ARE SYMPTOMS THAT SHOULD BE REPORTED IMMEDIATELY:  *FEVER GREATER THAN 100.5 F  *CHILLS WITH OR WITHOUT FEVER  NAUSEA AND VOMITING THAT IS NOT CONTROLLED WITH YOUR NAUSEA MEDICATION  *UNUSUAL SHORTNESS OF BREATH  *UNUSUAL BRUISING OR BLEEDING  TENDERNESS IN MOUTH AND THROAT WITH OR WITHOUT PRESENCE OF ULCERS  *URINARY PROBLEMS  *BOWEL PROBLEMS  UNUSUAL RASH Items with * indicate a potential emergency and should be followed up as soon as possible.  Feel free to call the clinic you have any questions or concerns. The clinic phone number is [phone].  Paclitaxel injection What is this medicine? PACLITAXEL (PAK li TAX el) is a chemotherapy drug. It targets fast dividing cells, like cancer cells, and causes these cells to die. This medicine is used to treat ovarian cancer, breast cancer, and other cancers. This medicine may be used for other purposes; ask your health care provider or pharmacist if you have questions. COMMON BRAND NAME(S): Onxol , Taxol What should I tell my health care provider before I take this medicine? They need to know if you have any of these conditions: -blood disorders -irregular heartbeat -infection (especially a virus infection such as chickenpox, cold sores, or herpes) -liver disease -previous or ongoing radiation therapy -an unusual or allergic reaction to paclitaxel, alcohol, polyoxyethylated castor oil, other chemotherapy agents, other medicines, foods, dyes, or preservatives -pregnant or trying to get pregnant -breast-feeding How should I use this  medicine? This drug is given as an infusion into a vein. It is administered in a hospital or clinic by a specially trained health care professional. Talk to your pediatrician regarding the use of this medicine in children. Special care may be needed. Overdosage: If you think you have taken too much of this medicine contact a poison control center or emergency room at once. NOTE: This medicine is only for you. Do not share this medicine with others. What if I miss a dose? It is important not to miss your dose. Call your doctor or health care professional if you are unable to keep an appointment. What may interact with this medicine? Do not take this medicine with any of the following medications: -disulfiram -metronidazole This medicine may also interact with the following medications: -cyclosporine -diazepam -ketoconazole -medicines to increase blood counts like filgrastim, pegfilgrastim, sargramostim -other chemotherapy drugs like cisplatin, doxorubicin, epirubicin, etoposide, teniposide, vincristine -quinidine -testosterone -vaccines -verapamil Talk to your doctor or health care professional before taking any of these medicines: -acetaminophen -aspirin -ibuprofen -ketoprofen -naproxen This list may not describe all possible interactions. Give your health care provider a list of all the medicines, herbs, non-prescription drugs, or dietary supplements you use. Also tell them if you smoke, drink alcohol, or use illegal drugs. Some items may interact with your medicine. What should I watch for while using this medicine? Your condition will be monitored carefully while you are receiving this medicine. You will need important blood work done while you are taking this medicine. This drug may make you feel generally unwell. This is not uncommon, as chemotherapy can affect healthy cells as well as cancer cells. Report any side effects. Continue your course of  treatment even though you feel ill  unless your doctor tells you to stop. In some cases, you may be given additional medicines to help with side effects. Follow all directions for their use. Call your doctor or health care professional for advice if you get a fever, chills or sore throat, or other symptoms of a cold or flu. Do not treat yourself. This drug decreases your body's ability to fight infections. Try to avoid being around people who are sick. This medicine may increase your risk to bruise or bleed. Call your doctor or health care professional if you notice any unusual bleeding. Be careful brushing and flossing your teeth or using a toothpick because you may get an infection or bleed more easily. If you have any dental work done, tell your dentist you are receiving this medicine. Avoid taking products that contain aspirin, acetaminophen, ibuprofen, naproxen, or ketoprofen unless instructed by your doctor. These medicines may hide a fever. Do not become pregnant while taking this medicine. Women should inform their doctor if they wish to become pregnant or think they might be pregnant. There is a potential for serious side effects to an unborn child. Talk to your health care professional or pharmacist for more information. Do not breast-feed an infant while taking this medicine. Men are advised not to father a child while receiving this medicine. What side effects may I notice from receiving this medicine? Side effects that you should report to your doctor or health care professional as soon as possible: -allergic reactions like skin rash, itching or hives, swelling of the face, lips, or tongue -low blood counts - This drug may decrease the number of white blood cells, red blood cells and platelets. You may be at increased risk for infections and bleeding. -signs of infection - fever or chills, cough, sore throat, pain or difficulty passing urine -signs of decreased platelets or bleeding - bruising, pinpoint red spots on the skin,  black, tarry stools, nosebleeds -signs of decreased red blood cells - unusually weak or tired, fainting spells, lightheadedness -breathing problems -chest pain -high or low blood pressure -mouth sores -nausea and vomiting -pain, swelling, redness or irritation at the injection site -pain, tingling, numbness in the hands or feet -slow or irregular heartbeat -swelling of the ankle, feet, hands Side effects that usually do not require medical attention (report to your doctor or health care professional if they continue or are bothersome): -bone pain -complete hair loss including hair on your head, underarms, pubic hair, eyebrows, and eyelashes -changes in the color of fingernails -diarrhea -loosening of the fingernails -loss of appetite -muscle or joint pain -red flush to skin -sweating This list may not describe all possible side effects. Call your doctor for medical advice about side effects. You may report side effects to FDA at 1-800-FDA-1088. Where should I keep my medicine? This drug is given in a hospital or clinic and will not be stored at home. NOTE: This sheet is a summary. It may not cover all possible information. If you have questions about this medicine, talk to your doctor, pharmacist, or health care provider.     2014, Elsevier/Gold Standard. (2012-09-03 16:41:21)  Carboplatin injection What is this medicine? CARBOPLATIN (KAR boe pla tin) is a chemotherapy drug. It targets fast dividing cells, like cancer cells, and causes these cells to die. This medicine is used to treat ovarian cancer and many other cancers. This medicine may be used for other purposes; ask your health care provider or pharmacist if you  have questions. COMMON BRAND NAME(S): Paraplatin What should I tell my health care provider before I take this medicine? They need to know if you have any of these conditions: -blood disorders -hearing problems -kidney disease -recent or ongoing radiation  therapy -an unusual or allergic reaction to carboplatin, cisplatin, other chemotherapy, other medicines, foods, dyes, or preservatives -pregnant or trying to get pregnant -breast-feeding How should I use this medicine? This drug is usually given as an infusion into a vein. It is administered in a hospital or clinic by a specially trained health care professional. Talk to your pediatrician regarding the use of this medicine in children. Special care may be needed. Overdosage: If you think you have taken too much of this medicine contact a poison control center or emergency room at once. NOTE: This medicine is only for you. Do not share this medicine with others. What if I miss a dose? It is important not to miss a dose. Call your doctor or health care professional if you are unable to keep an appointment. What may interact with this medicine? -medicines for seizures -medicines to increase blood counts like filgrastim, pegfilgrastim, sargramostim -some antibiotics like amikacin, gentamicin, neomycin, streptomycin, tobramycin -vaccines Talk to your doctor or health care professional before taking any of these medicines: -acetaminophen -aspirin -ibuprofen -ketoprofen -naproxen This list may not describe all possible interactions. Give your health care provider a list of all the medicines, herbs, non-prescription drugs, or dietary supplements you use. Also tell them if you smoke, drink alcohol, or use illegal drugs. Some items may interact with your medicine. What should I watch for while using this medicine? Your condition will be monitored carefully while you are receiving this medicine. You will need important blood work done while you are taking this medicine. This drug may make you feel generally unwell. This is not uncommon, as chemotherapy can affect healthy cells as well as cancer cells. Report any side effects. Continue your course of treatment even though you feel ill unless your doctor  tells you to stop. In some cases, you may be given additional medicines to help with side effects. Follow all directions for their use. Call your doctor or health care professional for advice if you get a fever, chills or sore throat, or other symptoms of a cold or flu. Do not treat yourself. This drug decreases your body's ability to fight infections. Try to avoid being around people who are sick. This medicine may increase your risk to bruise or bleed. Call your doctor or health care professional if you notice any unusual bleeding. Be careful brushing and flossing your teeth or using a toothpick because you may get an infection or bleed more easily. If you have any dental work done, tell your dentist you are receiving this medicine. Avoid taking products that contain aspirin, acetaminophen, ibuprofen, naproxen, or ketoprofen unless instructed by your doctor. These medicines may hide a fever. Do not become pregnant while taking this medicine. Women should inform their doctor if they wish to become pregnant or think they might be pregnant. There is a potential for serious side effects to an unborn child. Talk to your health care professional or pharmacist for more information. Do not breast-feed an infant while taking this medicine. What side effects may I notice from receiving this medicine? Side effects that you should report to your doctor or health care professional as soon as possible: -allergic reactions like skin rash, itching or hives, swelling of the face, lips, or tongue -signs of  infection - fever or chills, cough, sore throat, pain or difficulty passing urine -signs of decreased platelets or bleeding - bruising, pinpoint red spots on the skin, black, tarry stools, nosebleeds -signs of decreased red blood cells - unusually weak or tired, fainting spells, lightheadedness -breathing problems -changes in hearing -changes in vision -chest pain -high blood pressure -low blood counts - This  drug may decrease the number of white blood cells, red blood cells and platelets. You may be at increased risk for infections and bleeding. -nausea and vomiting -pain, swelling, redness or irritation at the injection site -pain, tingling, numbness in the hands or feet -problems with balance, talking, walking -trouble passing urine or change in the amount of urine Side effects that usually do not require medical attention (report to your doctor or health care professional if they continue or are bothersome): -hair loss -loss of appetite -metallic taste in the mouth or changes in taste This list may not describe all possible side effects. Call your doctor for medical advice about side effects. You may report side effects to FDA at 1-800-FDA-1088. Where should I keep my medicine? This drug is given in a hospital or clinic and will not be stored at home. NOTE: This sheet is a summary. It may not cover all possible information. If you have questions about this medicine, talk to your doctor, pharmacist, or health care provider.  2014, Elsevier/Gold Standard. (2007-10-16 14:38:05)

## 2013-06-13 ENCOUNTER — Ambulatory Visit (INDEPENDENT_AMBULATORY_CARE_PROVIDER_SITE_OTHER): Payer: BC Managed Care – PPO | Admitting: Surgery

## 2013-06-13 ENCOUNTER — Telehealth: Payer: Self-pay | Admitting: *Deleted

## 2013-06-13 ENCOUNTER — Encounter (INDEPENDENT_AMBULATORY_CARE_PROVIDER_SITE_OTHER): Payer: Self-pay | Admitting: Surgery

## 2013-06-13 VITALS — BP 100/68 | HR 64 | Temp 97.6°F | Resp 14 | Ht 69.0 in | Wt 123.2 lb

## 2013-06-13 DIAGNOSIS — C50411 Malignant neoplasm of upper-outer quadrant of right female breast: Secondary | ICD-10-CM

## 2013-06-13 DIAGNOSIS — C50419 Malignant neoplasm of upper-outer quadrant of unspecified female breast: Secondary | ICD-10-CM

## 2013-06-13 NOTE — Progress Notes (Signed)
NAME: Janet [name]       DOB: Jul 08, 1956           DATE: 06/13/2013       RUE:[id number]  CC:  Chief Complaint  Patient presents with  . Routine Post Op    reck abscess    HPI: this patient is undergoing neoadjuvant chemotherapy for her right breast cancer. She comes in for a new treatment check up. She feels that she is doing very good. She is due to work full-time and having no problems with her chemotherapy. She thinks the tumor has significantly gotten smaller.  Past medical history family history are reviewed in the electronic medical record  EXAM: Vital signs: BP 100/68  Pulse 64  Temp(Src) 97.6 F (36.4 C) (Temporal)  Resp 14  Ht 5\' 9"  (1.753 m)  Wt 123 lb 3.2 oz (55.883 kg)  BMI 18.19 kg/m2  General: Patient alert, oriented, NAD  Breasts: The left breast is normal. The right breast shows a little bit density at the areolar edge 12:00 position, not obvious mass today. There no skin nipple or areolar changes noted.  Lymphatics: There is no axillary or cervical adenopathy on either side  Data reviewed: Her recent MRI shows significant reduction in the tumor size. IMP: right breast cancer, trouble negative, undergoing neoadjuvant chemotherapy with clinical response  PLAN: she was hoping to have a lumpectomy and sentinel as opposed to mastectomy. We discussed again at length. The tumor was fairly close to the nipple are really complex and initially had thought she might need to have a central partial mastectomy as her lumpectomy. This will need to be reassessed after completion of her chemotherapy. We also discussed our localizations and simple node injections et Karie Soda. I think she has good understanding of the plan.  She'll come back to see Dr. Ovidio Kin in late January so he can plan her surgery and have an opportunity to review her entire situation several weeks before the completion of her chemotherapy.  Arihana Ambrocio J 06/13/2013

## 2013-06-13 NOTE — Telephone Encounter (Signed)
Message copied by Augusto Garbe on Thu Jun 13, 2013 11:19 AM ------      Message from: Caren Griffins      Created: Wed Jun 12, 2013  2:27 PM      Regarding: chemo f/u       [phone] (H)            1st taxol carboplatin, Dr Darnelle Catalan ------

## 2013-06-13 NOTE — Telephone Encounter (Signed)
Called Janet [name] for chemotherapy F/U.  Patient is doing well.  "I feel great, wonderful, no n/v and lots of energy.  No numbness or tingling."  Denies n/v.  Denies any new side effects or symptoms.  Bowel and bladder is functioning well.  Eating and drinking well and I instructed to drink 64 oz minimum daily or at least the day before, of and after treatment.  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  Plans to call [employer] to inquire what drugs they are referring to.  Informed her the chemotherapy will nadir by day 7 to 14 post treatment affecting blood counts.  Asked will the meds cause hepatitis.  She will discuss this further on f/u visit 06-19-2013.  Suggested she bring the letter and whatever information she learns from [place].  Encouraged to call if needed.  Reviewed how to call after hours in the case of an emergency.

## 2013-06-13 NOTE — Patient Instructions (Signed)
See Dr Ezzard Standing in late January to schedule surgery

## 2013-06-14 ENCOUNTER — Other Ambulatory Visit: Payer: Self-pay | Admitting: *Deleted

## 2013-06-14 ENCOUNTER — Encounter: Payer: Self-pay | Admitting: Physician Assistant

## 2013-06-14 DIAGNOSIS — C50411 Malignant neoplasm of upper-outer quadrant of right female breast: Secondary | ICD-10-CM

## 2013-06-14 MED ORDER — PROCHLORPERAZINE MALEATE 10 MG PO TABS: 10.0000 mg | ORAL_TABLET | Freq: Four times a day (QID) | ORAL | Status: DC | PRN

## 2013-06-18 ENCOUNTER — Encounter: Payer: Self-pay | Admitting: *Deleted

## 2013-06-18 ENCOUNTER — Ambulatory Visit (HOSPITAL_BASED_OUTPATIENT_CLINIC_OR_DEPARTMENT_OTHER): Payer: BC Managed Care – PPO

## 2013-06-18 ENCOUNTER — Ambulatory Visit (HOSPITAL_BASED_OUTPATIENT_CLINIC_OR_DEPARTMENT_OTHER): Payer: BC Managed Care – PPO | Admitting: Physician Assistant

## 2013-06-18 ENCOUNTER — Other Ambulatory Visit (HOSPITAL_BASED_OUTPATIENT_CLINIC_OR_DEPARTMENT_OTHER): Payer: BC Managed Care – PPO | Admitting: Lab

## 2013-06-18 ENCOUNTER — Encounter: Payer: Self-pay | Admitting: Physician Assistant

## 2013-06-18 VITALS — BP 108/67 | HR 80 | Temp 98.6°F | Resp 18 | Ht 69.0 in | Wt 118.9 lb

## 2013-06-18 DIAGNOSIS — Z5111 Encounter for antineoplastic chemotherapy: Secondary | ICD-10-CM

## 2013-06-18 DIAGNOSIS — L0390 Cellulitis, unspecified: Secondary | ICD-10-CM

## 2013-06-18 DIAGNOSIS — C50411 Malignant neoplasm of upper-outer quadrant of right female breast: Secondary | ICD-10-CM

## 2013-06-18 DIAGNOSIS — C50419 Malignant neoplasm of upper-outer quadrant of unspecified female breast: Secondary | ICD-10-CM

## 2013-06-18 DIAGNOSIS — Z171 Estrogen receptor negative status [ER-]: Secondary | ICD-10-CM

## 2013-06-18 DIAGNOSIS — C50919 Malignant neoplasm of unspecified site of unspecified female breast: Secondary | ICD-10-CM

## 2013-06-18 DIAGNOSIS — M79609 Pain in unspecified limb: Secondary | ICD-10-CM

## 2013-06-18 LAB — COMPREHENSIVE METABOLIC PANEL (CC13)
AST: 18 U/L (ref 5–34)
Albumin: 3.8 g/dL (ref 3.5–5.0)
Alkaline Phosphatase: 57 U/L (ref 40–150)
Anion Gap: 8 mEq/L (ref 3–11)
CO2: 22 mEq/L (ref 22–29)
Creatinine: 0.7 mg/dL (ref 0.6–1.1)
Glucose: 97 mg/dl (ref 70–140)
Sodium: 138 mEq/L (ref 136–145)
Total Bilirubin: 0.82 mg/dL (ref 0.20–1.20)
Total Protein: 6 g/dL — ABNORMAL LOW (ref 6.4–8.3)

## 2013-06-18 LAB — CBC WITH DIFFERENTIAL/PLATELET
Basophils Absolute: 0 10*3/uL (ref 0.0–0.1)
EOS%: 0.1 % (ref 0.0–7.0)
HCT: 32.2 % — ABNORMAL LOW (ref 34.8–46.6)
HGB: 10.9 g/dL — ABNORMAL LOW (ref 11.6–15.9)
MCH: 31.6 pg (ref 25.1–34.0)
MONO#: 0.3 10*3/uL (ref 0.1–0.9)
NEUT#: 2.9 10*3/uL (ref 1.5–6.5)
NEUT%: 70.7 % (ref 38.4–76.8)
RBC: 3.45 10*6/uL — ABNORMAL LOW (ref 3.70–5.45)
RDW: 15.6 % — ABNORMAL HIGH (ref 11.2–14.5)
WBC: 4.2 10*3/uL (ref 3.9–10.3)
lymph#: 0.9 10*3/uL (ref 0.9–3.3)

## 2013-06-18 MED ORDER — DOXYCYCLINE HYCLATE 100 MG PO TABS: 100.0000 mg | ORAL_TABLET | Freq: Every day | ORAL | Status: DC

## 2013-06-18 MED ORDER — DIPHENHYDRAMINE HCL 50 MG/ML IJ SOLN
25.0000 mg | Freq: Once | INTRAMUSCULAR | Status: AC
  Administered 2013-06-18: 25 mg via INTRAVENOUS

## 2013-06-18 MED ORDER — ONDANSETRON 16 MG/50ML IVPB (CHCC)
INTRAVENOUS | Status: AC
  Filled 2013-06-18: qty 16

## 2013-06-18 MED ORDER — DIPHENHYDRAMINE HCL 50 MG/ML IJ SOLN
INTRAMUSCULAR | Status: AC
  Filled 2013-06-18: qty 1

## 2013-06-18 MED ORDER — SODIUM CHLORIDE 0.9 % IJ SOLN
10.0000 mL | INTRAMUSCULAR | Status: DC | PRN
  Administered 2013-06-18: 10 mL
  Filled 2013-06-18: qty 10

## 2013-06-18 MED ORDER — FAMOTIDINE IN NACL 20-0.9 MG/50ML-% IV SOLN
INTRAVENOUS | Status: AC
  Filled 2013-06-18: qty 50

## 2013-06-18 MED ORDER — DEXAMETHASONE SODIUM PHOSPHATE 20 MG/5ML IJ SOLN
20.0000 mg | Freq: Once | INTRAMUSCULAR | Status: AC
  Administered 2013-06-18: 20 mg via INTRAVENOUS

## 2013-06-18 MED ORDER — ONDANSETRON 16 MG/50ML IVPB (CHCC)
16.0000 mg | Freq: Once | INTRAVENOUS | Status: AC
  Administered 2013-06-18: 16 mg via INTRAVENOUS

## 2013-06-18 MED ORDER — SODIUM CHLORIDE 0.9 % IV SOLN
207.6000 mg | Freq: Once | INTRAVENOUS | Status: AC
  Administered 2013-06-18: 210 mg via INTRAVENOUS
  Filled 2013-06-18: qty 21

## 2013-06-18 MED ORDER — SODIUM CHLORIDE 0.9 % IV SOLN
Freq: Once | INTRAVENOUS | Status: AC
  Administered 2013-06-18: 15:00:00 via INTRAVENOUS

## 2013-06-18 MED ORDER — DEXAMETHASONE SODIUM PHOSPHATE 20 MG/5ML IJ SOLN
INTRAMUSCULAR | Status: AC
  Filled 2013-06-18: qty 5

## 2013-06-18 MED ORDER — HEPARIN SOD (PORK) LOCK FLUSH 100 UNIT/ML IV SOLN
500.0000 [IU] | Freq: Once | INTRAVENOUS | Status: AC | PRN
  Administered 2013-06-18: 500 [IU]
  Filled 2013-06-18: qty 5

## 2013-06-18 MED ORDER — PACLITAXEL CHEMO INJECTION 300 MG/50ML
80.0000 mg/m2 | Freq: Once | INTRAVENOUS | Status: AC
  Administered 2013-06-18: 132 mg via INTRAVENOUS
  Filled 2013-06-18: qty 22

## 2013-06-18 MED ORDER — FAMOTIDINE IN NACL 20-0.9 MG/50ML-% IV SOLN
20.0000 mg | Freq: Once | INTRAVENOUS | Status: AC
  Administered 2013-06-18: 20 mg via INTRAVENOUS
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
## 2013-06-18 NOTE — Progress Notes (Signed)
Patient ID: Janet [name], female   DOB: 02-26-1956, 57 y.o.   MRN: [id number] ID: Janet [name] OB: 05-17-1956  MR#: [id number]  ZHY#:[id number]  PCP: Astrid Divine, MD GYN:   Janeece RiggersCicero Duck; Ovidio Kin OTHER MD: Chipper Herb, Maryelizabeth Kaufmann  CHIEF COMPLAINT:  Right Breast Cancer/Neoadjuvant Chemo   HISTORY OF PRESENT ILLNESS: "Janet [name]" herself noted a lump in her right breast on 03/17/2013, and immediately contacted her physician so that on 03/18/2013 bilateral diagnostic mammography and right breast ultrasonography at the breast Center showed a developing mass in the subareolar region of the right breast. There were no malignant type calcifications. This mass was palpable in the upper outer quadrant, and by ultrasound measured 2.6 cm. Ultrasonography of the right axilla showed a lymph node measuring 1.1 cm, with a thickened cortex. The left breast was unremarkable.  Biopsy of the right breast mass 08/26//2014 showed ([name]) and invasive ductal carcinoma, grade 3, triple negative, with an MIB-1 of 54%. Biopsy of the right axillary lymph node in question at the same time was negative.  Bilateral breast MRI 03/26/2013 measured the right subareolar mass at 3.8 cm. The previously biopsied benign right axillary lymph node was again noted. There were no other suspicious areas in the right breast or axilla and the left breast was unremarkable.  Her subsequent history is as detailed below  INTERVAL HISTORY: Janet [name] returns alone today for followup of her locally advanced right breast cancer. She is now being treated with weekly carboplatin/paclitaxel and is due for her second of 12 weekly doses today.   She tolerated the first cycle well last week. Her energy level was slightly reduced for the first couple of days, but now she is "feeling good". In fact she tells me she mowed her yard before she came in for her appointment today.  She still working full-time and exercising  daily.  She had a rash thought to be secondary to azithromycin and was treated last week with a Medrol Dosepak. The rash has almost completely resolved at this point, and she's had no additional skin changes.   In fact her only other complaint today is what she thinks may be an early right ingrown toenail. She tells me it feels better today than it did yesterday but is slightly red and sore to touch. She's had no drainage or bleeding from the need for now.   REVIEW OF SYSTEMS: Janet [name] has had no fevers, chills, or night sweats.  She's had no mouth ulcers or oral sensitivity. She has some taste aversion and a reduced appetite, but denies any problems with nausea, emesis, or change in bowel or bladder habits. She has no cough, and has had no increased shortness of breath, chest pain, or palpitations. She's had no dizziness or change in vision. She does have a history of migraines, which have been slightly improved. She uses Imitrex as needed. She also denies any new or unusual myalgias, arthralgias, or bony pain, and has had no peripheral swelling. She continues to deny any signs of numbness or tingling in the upper or lower extremities.  Otherwise a detailed review of systems today was noncontributory  PAST MEDICAL HISTORY: Past Medical History  Diagnosis Date  . Migraines   . Cancer     breast  . Breast cancer   . Allergy     PAST SURGICAL HISTORY: Past Surgical History  Procedure Laterality Date  . Removal of cataracts    . Foot surgery  age 61  bone graft  . Portacath placement N/A 04/04/2013    Procedure: INSERTION PORT-A-CATH;  Surgeon: Currie Paris, MD;  Location: [facility];  Service: General;  Laterality: N/A;    FAMILY HISTORY Family History  Problem Relation Age of Onset  . Pancreatic cancer Mother   . Breast cancer Maternal Grandmother    the patient's father died at the age of 76 with pancreatic cancer. (He was Dr. Chrystine Oiler). [name] is alive at age  25. Janet [name] has one brother, no sisters. [name]'s mother died from breast cancer at the age of 43. There is no history of ovarian cancer in the family  GYNECOLOGIC HISTORY:  Menarche age 15, first live birth age 50. She is GX P2. She stopped having periods approximately 2004. She did not take hormone replacement. She did take birth control pills remotely for 5-10 years, with no complications.  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 Her husband the [place]. Janet [name], is a local judge. The patient's 2 children are Janet [name] who lives in [place] and works as a Financial risk analyst, and Janet [name], who works in [place] and is an associate with a Set designer group.  ADVANCED DIRECTIVES: In place   HEALTH MAINTENANCE: (Updated 06/11/2013) History  Substance Use Topics  . Smoking status: Never Smoker   . Smokeless tobacco: Never Used  . Alcohol Use: Yes     Comment: occasional     Colonoscopy: Not on file  PAP:  Oct 2014, Dr. Valentina Lucks  Bone density: July 2013 at [facility],    osteopenia  Lipid panel: UTD, Dr. Valentina Lucks   Allergies  Allergen Reactions  . Penicillins   . Azithromycin Rash    Current Outpatient Prescriptions  Medication Sig Dispense Refill  . acetaminophen (TYLENOL) 325 MG tablet Take 650 mg by mouth every 6 (six) hours as needed for pain.      Marland Kitchen lidocaine-prilocaine (EMLA) cream Apply topically as needed.  30 g  0  . LORazepam (ATIVAN) 0.5 MG tablet       . Multiple Vitamin (MULTI-VITAMIN DAILY PO) Take by mouth daily.      . prochlorperazine (COMPAZINE) 10 MG tablet Take 1 tablet (10 mg total) by mouth every 6 (six) hours as needed (Nausea or vomiting).  30 tablet  1  . SUMAtriptan (IMITREX) 50 MG tablet Take 1  tablet (50 mg total) by mouth every 2 (two) hours as needed for migraine. May repeat in 2 hours if headache persists or recurs.  10 tablet  0  . Alum & Mag Hydroxide-Simeth (MAGIC MOUTHWASH W/LIDOCAINE) SOLN Take 5 mLs by mouth 4 (four) times daily as needed. For throat or mouth pain  240 mL  1  . benzonatate (TESSALON) 100 MG capsule Take 1 capsule (100 mg total) by mouth 3 (three) times daily as needed for cough.  30 capsule  0  . CALCIUM PO Take by mouth daily.      Marland Kitchen doxycycline (VIBRA-TABS) 100 MG tablet Take 1 tablet (100 mg total) by mouth daily.  10 tablet  0  . polyethylene glycol powder (MIRALAX) powder Take 17 g by mouth daily.  255 g  0   No current facility-administered medications for this visit.  OBJECTIVE: Middle-aged white woman in no acute distress  Filed Vitals:   06/18/13 1424  BP: 108/67  Pulse: 80  Temp: 98.6 F (37 C)  Resp: 18     Body mass index is 17.55 kg/(m^2).    ECOG FS:1 Filed Weights   06/18/13 1424  Weight: 118 lb 14.4 oz (53.933 kg)   Physical Exam: HEENT:  Sclerae anicteric.  Oropharynx clear. No ulcerations. No thrush. Buccal mucosa is pink and moist.  NODES:  No cervical or supraclavicular lymphadenopathy palpated.  BREAST EXAM:  Deferred. Axillae are benign bilaterally, with no palpable lymphadenopathy. LUNGS:  Clear to auscultation bilaterally, with good excursion.  No wheezes or rhonchi. HEART:  Regular rate and rhythm. No murmur appreciated. ABDOMEN:  Soft, thin, nontender.  Positive bowel sounds.  MSK:  No focal spinal tenderness to palpation. Good range of motion in the upper extremities. No joint swelling. EXTREMITIES:  No peripheral edema.   SKIN:  The erythematous rash on the back is almost completely resolved, with some residual dry skin but no additional skin lesions. The second toe on the right foot was examined. There is some redness and tenderness of the skin surrounding the nail bed, but no evidence of drainage or bleeding. No nail  dyscrasia is noted. NEURO:  Nonfocal. Well oriented.  Positive affect.   LAB RESULTS:  CMP     Component Value Date/Time   NA 138 06/18/2013 1401   K 4.3 06/18/2013 1401   CO2 22 06/18/2013 1401   GLUCOSE 97 06/18/2013 1401   BUN 18.4 06/18/2013 1401   CREATININE 0.7 06/18/2013 1401   CALCIUM 9.9 06/18/2013 1401   PROT 6.0* 06/18/2013 1401   ALBUMIN 3.8 06/18/2013 1401   AST 18 06/18/2013 1401   ALT 26 06/18/2013 1401   ALKPHOS 57 06/18/2013 1401   BILITOT 0.82 06/18/2013 1401     Lab Results  Component Value Date   WBC 4.2 06/18/2013   NEUTROABS 2.9 06/18/2013   HGB 10.9* 06/18/2013   HCT 32.2* 06/18/2013   MCV 93.4 06/18/2013   PLT 198 06/18/2013      STUDIES:  Mr Breast Bilateral W Wo Contrast  06/05/2013   CLINICAL DATA:  Patient with right breast carcinoma being treated with neoadjuvant chemotherapy. Re-evaluate tumor size.  EXAM: MR BILATERAL BREAST WITHOUT AND WITH CONTRAST  TECHNIQUE: Multiplanar, multisequence MR images of both breasts were obtained prior to and following the intravenous administration of 11ml of MultiHance.  THREE-DIMENSIONAL MR IMAGE RENDERING ON INDEPENDENT WORKSTATION:  Three-dimensional MR images were rendered by post-processing of the original MR data on an independent workstation. The three-dimensional MR images were interpreted, and findings are reported in the following complete MRI report for this study.  COMPARISON:  Previous exams  FINDINGS: Breast composition: d. Extreme fibroglandular tissue  Background parenchymal enhancement: Moderate  Right breast: Very minimal residual punctate enhancement is demonstrated within the subareolar region of the right breast at the site of biopsied right breast malignancy. Scattered foci of enhancement within the right breast.  Left breast: No mass or abnormal enhancement.  Lymph nodes: No abnormal appearing lymph nodes.  Ancillary findings:  None.  IMPRESSION: Very minimal residual punctate enhancement  is demonstrated at the site of previously biopsied right breast malignancy, compatible with treatment response.  RECOMMENDATION: Treatment plan.  BI-RADS CATEGORY  6: Known biopsy-proven malignancy - appropriate action should be taken.   Electronically Signed   By: Annia Belt M.D.   On: 06/05/2013 16:42    ASSESSMENT: 57  y.o. Janet [name] woman   (1)  status post right breast biopsy 03/19/2013 for a clinical T2 N0, stage IIA invasive ductal breast cancer, grade 3, triple negative, with an MIB-1 of 54%.  (2) suspicious right axillary lymph node biopsy negative  (3) received four cycles of dose dense doxorubicin/ cyclophosphamide between 04/17/2013 and 05/29/2013 , at standard doses, with Neulasta support;  (4) on  06/12/2013 starting 12 weekly doses of carboplatin/ paclitaxel; will skip the Christmas week dose (add at the end)   (5) anticipate definitive surgery 09/30/2013 (Spring Break week)   PLAN: Janet [name] will receive her second weekly dose of carboplatin/paclitaxel today as scheduled. I will see her again next week on December 2 in anticipation of cycle #3. She appears to be tolerating this well, and I am making no changes to her regimen today.   I have asked her to keep an eye on the painful toe. I recommended that she try foot soaks twice daily, drying the area thoroughly afterwards.  Since it is a holiday weekend, I've also given her a written prescription for doxycycline, 100 mg daily x10 days.  I have recommended that she keep a close eye on the toe, and if she feels it is more red, more painful, or shows any signs of infection, she will start the antibiotic.  I will reassess when I see her next week.   She voices understanding and agreement with the above plan, noticed called any changes or problems prior to her appointment with me next week.   Marzell Allemand, PA-C   06/18/2013 3:16 PM

## 2013-06-18 NOTE — Patient Instructions (Signed)
[facility] Discharge Instructions for Patients Receiving Chemotherapy  Today you received the following chemotherapy agents: Taxol and Carboplatin.  To help prevent nausea and vomiting after your treatment, we encourage you to take your nausea medication as prescribed.   If you develop nausea and vomiting that is not controlled by your nausea medication, call the clinic.   BELOW ARE SYMPTOMS THAT SHOULD BE REPORTED IMMEDIATELY:  *FEVER GREATER THAN 100.5 F  *CHILLS WITH OR WITHOUT FEVER  NAUSEA AND VOMITING THAT IS NOT CONTROLLED WITH YOUR NAUSEA MEDICATION  *UNUSUAL SHORTNESS OF BREATH  *UNUSUAL BRUISING OR BLEEDING  TENDERNESS IN MOUTH AND THROAT WITH OR WITHOUT PRESENCE OF ULCERS  *URINARY PROBLEMS  *BOWEL PROBLEMS  UNUSUAL RASH Items with * indicate a potential emergency and should be followed up as soon as possible.  Feel free to call the clinic you have any questions or concerns. The clinic phone number is [phone].    

## 2013-06-25 ENCOUNTER — Other Ambulatory Visit: Payer: BC Managed Care – PPO | Admitting: Lab

## 2013-06-25 ENCOUNTER — Ambulatory Visit (HOSPITAL_BASED_OUTPATIENT_CLINIC_OR_DEPARTMENT_OTHER): Payer: BC Managed Care – PPO

## 2013-06-25 ENCOUNTER — Encounter: Payer: Self-pay | Admitting: Physician Assistant

## 2013-06-25 ENCOUNTER — Telehealth: Payer: Self-pay | Admitting: *Deleted

## 2013-06-25 ENCOUNTER — Ambulatory Visit (HOSPITAL_BASED_OUTPATIENT_CLINIC_OR_DEPARTMENT_OTHER): Payer: BC Managed Care – PPO | Admitting: Physician Assistant

## 2013-06-25 ENCOUNTER — Ambulatory Visit: Payer: BC Managed Care – PPO | Admitting: Physician Assistant

## 2013-06-25 ENCOUNTER — Ambulatory Visit (HOSPITAL_BASED_OUTPATIENT_CLINIC_OR_DEPARTMENT_OTHER): Payer: BC Managed Care – PPO | Admitting: Lab

## 2013-06-25 ENCOUNTER — Other Ambulatory Visit (HOSPITAL_BASED_OUTPATIENT_CLINIC_OR_DEPARTMENT_OTHER): Payer: BC Managed Care – PPO | Admitting: Lab

## 2013-06-25 VITALS — BP 98/60 | HR 79 | Temp 98.6°F | Resp 18 | Ht 69.0 in | Wt 121.0 lb

## 2013-06-25 DIAGNOSIS — C50419 Malignant neoplasm of upper-outer quadrant of unspecified female breast: Secondary | ICD-10-CM

## 2013-06-25 DIAGNOSIS — Z171 Estrogen receptor negative status [ER-]: Secondary | ICD-10-CM

## 2013-06-25 DIAGNOSIS — N390 Urinary tract infection, site not specified: Secondary | ICD-10-CM

## 2013-06-25 DIAGNOSIS — C50411 Malignant neoplasm of upper-outer quadrant of right female breast: Secondary | ICD-10-CM

## 2013-06-25 DIAGNOSIS — C50919 Malignant neoplasm of unspecified site of unspecified female breast: Secondary | ICD-10-CM

## 2013-06-25 DIAGNOSIS — R3919 Other difficulties with micturition: Secondary | ICD-10-CM

## 2013-06-25 DIAGNOSIS — R599 Enlarged lymph nodes, unspecified: Secondary | ICD-10-CM

## 2013-06-25 DIAGNOSIS — Z5111 Encounter for antineoplastic chemotherapy: Secondary | ICD-10-CM

## 2013-06-25 LAB — CBC WITH DIFFERENTIAL/PLATELET
BASO%: 1.1 % (ref 0.0–2.0)
Basophils Absolute: 0 10*3/uL (ref 0.0–0.1)
EOS%: 1.1 % (ref 0.0–7.0)
LYMPH%: 11 % — ABNORMAL LOW (ref 14.0–49.7)
MCH: 32.9 pg (ref 25.1–34.0)
MCHC: 33.9 g/dL (ref 31.5–36.0)
MCV: 97.2 fL (ref 79.5–101.0)
MONO%: 10.9 % (ref 0.0–14.0)
NEUT#: 3.3 10*3/uL (ref 1.5–6.5)
RBC: 3.19 10*6/uL — ABNORMAL LOW (ref 3.70–5.45)
RDW: 15.9 % — ABNORMAL HIGH (ref 11.2–14.5)
lymph#: 0.5 10*3/uL — ABNORMAL LOW (ref 0.9–3.3)

## 2013-06-25 LAB — URINALYSIS, MICROSCOPIC - CHCC
Bilirubin (Urine): NEGATIVE
Glucose: NEGATIVE mg/dL
Ketones: NEGATIVE mg/dL
Leukocyte Esterase: NEGATIVE
Nitrite: NEGATIVE
RBC / HPF: NEGATIVE (ref 0–2)
WBC, UA: NEGATIVE (ref 0–2)
pH: 7 (ref 4.6–8.0)

## 2013-06-25 MED ORDER — PACLITAXEL CHEMO INJECTION 300 MG/50ML
80.0000 mg/m2 | Freq: Once | INTRAVENOUS | Status: AC
  Administered 2013-06-25: 132 mg via INTRAVENOUS
  Filled 2013-06-25: qty 22

## 2013-06-25 MED ORDER — SODIUM CHLORIDE 0.9 % IJ SOLN
10.0000 mL | INTRAMUSCULAR | Status: DC | PRN
  Administered 2013-06-25: 10 mL
  Filled 2013-06-25: qty 10

## 2013-06-25 MED ORDER — FAMOTIDINE IN NACL 20-0.9 MG/50ML-% IV SOLN
20.0000 mg | Freq: Once | INTRAVENOUS | Status: AC
  Administered 2013-06-25: 20 mg via INTRAVENOUS

## 2013-06-25 MED ORDER — SODIUM CHLORIDE 0.9 % IV SOLN
Freq: Once | INTRAVENOUS | Status: AC
  Administered 2013-06-25: 12:00:00 via INTRAVENOUS

## 2013-06-25 MED ORDER — FAMOTIDINE IN NACL 20-0.9 MG/50ML-% IV SOLN
INTRAVENOUS | Status: AC
  Filled 2013-06-25: qty 50

## 2013-06-25 MED ORDER — DEXAMETHASONE SODIUM PHOSPHATE 20 MG/5ML IJ SOLN
INTRAMUSCULAR | Status: AC
  Filled 2013-06-25: qty 5

## 2013-06-25 MED ORDER — SODIUM CHLORIDE 0.9 % IV SOLN
207.6000 mg | Freq: Once | INTRAVENOUS | Status: AC
  Administered 2013-06-25: 210 mg via INTRAVENOUS
  Filled 2013-06-25: qty 21

## 2013-06-25 MED ORDER — ONDANSETRON 16 MG/50ML IVPB (CHCC)
INTRAVENOUS | Status: AC
  Filled 2013-06-25: qty 16

## 2013-06-25 MED ORDER — DIPHENHYDRAMINE HCL 50 MG/ML IJ SOLN
25.0000 mg | Freq: Once | INTRAMUSCULAR | Status: AC
  Administered 2013-06-25: 25 mg via INTRAVENOUS

## 2013-06-25 MED ORDER — DIPHENHYDRAMINE HCL 50 MG/ML IJ SOLN
INTRAMUSCULAR | Status: AC
  Filled 2013-06-25: qty 1

## 2013-06-25 MED ORDER — DEXAMETHASONE SODIUM PHOSPHATE 20 MG/5ML IJ SOLN
20.0000 mg | Freq: Once | INTRAMUSCULAR | Status: AC
  Administered 2013-06-25: 20 mg via INTRAVENOUS

## 2013-06-25 MED ORDER — ONDANSETRON 16 MG/50ML IVPB (CHCC)
16.0000 mg | Freq: Once | INTRAVENOUS | Status: AC
  Administered 2013-06-25: 16 mg via INTRAVENOUS

## 2013-06-25 MED ORDER — HEPARIN SOD (PORK) LOCK FLUSH 100 UNIT/ML IV SOLN
500.0000 [IU] | Freq: Once | INTRAVENOUS | Status: AC | PRN
  Administered 2013-06-25: 500 [IU]
  Filled 2013-06-25: qty 5

## 2013-06-25 NOTE — Telephone Encounter (Signed)
Per staff message and POF I have scheduled appts.  JMW  

## 2013-06-25 NOTE — Telephone Encounter (Signed)
Per PA-C request. Called patient to let her know her urinalysis was fine. Everything WNL. No signs of an UTI. No answer, but left message on answering service. Message forwarded to Zollie Scale, PA-C.

## 2013-06-25 NOTE — Patient Instructions (Signed)
[facility] Discharge Instructions for Patients Receiving Chemotherapy  Today you received the following chemotherapy agents: Taxol and Carboplatin.  To help prevent nausea and vomiting after your treatment, we encourage you to take your nausea medication as prescribed.   If you develop nausea and vomiting that is not controlled by your nausea medication, call the clinic.   BELOW ARE SYMPTOMS THAT SHOULD BE REPORTED IMMEDIATELY:  *FEVER GREATER THAN 100.5 F  *CHILLS WITH OR WITHOUT FEVER  NAUSEA AND VOMITING THAT IS NOT CONTROLLED WITH YOUR NAUSEA MEDICATION  *UNUSUAL SHORTNESS OF BREATH  *UNUSUAL BRUISING OR BLEEDING  TENDERNESS IN MOUTH AND THROAT WITH OR WITHOUT PRESENCE OF ULCERS  *URINARY PROBLEMS  *BOWEL PROBLEMS  UNUSUAL RASH Items with * indicate a potential emergency and should be followed up as soon as possible.  Feel free to call the clinic you have any questions or concerns. The clinic phone number is [phone].    

## 2013-06-25 NOTE — Telephone Encounter (Signed)
appts made and printed. Pt is aware that tx will be added. i emailed MW to add the tx...td 

## 2013-06-26 LAB — URINE CULTURE

## 2013-06-26 NOTE — Progress Notes (Signed)
Patient ID: Janet [name], female   DOB: [birth year], 57 y.o.   MRN: [id number] ID: Janet [name] OB: 1956-05-28  MR#: [id number]  IHK#:[id number]  PCP: Astrid Divine, MD GYN:   Janeece RiggersCicero Duck; Ovidio Kin OTHER MD: Chipper Herb, Maryelizabeth Kaufmann  CHIEF COMPLAINT:  Right Breast Cancer/Neoadjuvant Chemo   HISTORY OF PRESENT ILLNESS: "Janet [name]" herself noted a lump in her right breast on 03/17/2013, and immediately contacted her physician so that on 03/18/2013 bilateral diagnostic mammography and right breast ultrasonography at the breast Center showed a developing mass in the subareolar region of the right breast. There were no malignant type calcifications. This mass was palpable in the upper outer quadrant, and by ultrasound measured 2.6 cm. Ultrasonography of the right axilla showed a lymph node measuring 1.1 cm, with a thickened cortex. The left breast was unremarkable.  Biopsy of the right breast mass 08/26//2014 showed ([name]) and invasive ductal carcinoma, grade 3, triple negative, with an MIB-1 of 54%. Biopsy of the right axillary lymph node in question at the same time was negative.  Bilateral breast MRI 03/26/2013 measured the right subareolar mass at 3.8 cm. The previously biopsied benign right axillary lymph node was again noted. There were no other suspicious areas in the right breast or axilla and the left breast was unremarkable.  Her subsequent history is as detailed below  INTERVAL HISTORY: [name] returns alone today for followup of her locally advanced right breast cancer. She is now being treated with weekly carboplatin/paclitaxel and is due for her third of 12 weekly doses today. She continues to tolerate this combination well. She still working full-time, keeps herself very busy, and runs on a daily basis. In fact her biggest complaint today is associated with residual skin changes status post doxorubicin/cyclophosphamide.  Janet [name] has been having dry skin and  slight cracking in the skin of her hands bilaterally since finishing the doxorubicin/cyclophosphamide. This has actually worsened somewhat over the past week. Her fingertips are more sensitive, causing her some difficulty with fine motor skills. This is, however, associated with skin changes, not neuropathy. She's keeping that her hands and feet very well moisturized, and is applying Vaseline and cotton gloves at night.    REVIEW OF SYSTEMS: [name] has had no fevers, chills, or night sweats.  She's had no mouth ulcers or oral sensitivity. She still has some taste aversion, but denies any problems with nausea or emesis.  She had some abdominal cramping and diarrhea during the night last night, but by the morning this had resolved. She's had no additional abnormal stools, and denies any mucus or blood in the stool. She's also noted that her urine is darker than usual, but tells me she is hydrating herself well. She has no cough, and has had no increased shortness of breath, chest pain, or palpitations. She's had no recent headaches and denies any dizziness or change in vision.  She also denies any new or unusual myalgias, arthralgias, or bony pain, and has had no peripheral swelling. She continues to deny any signs of numbness or tingling in the upper or lower extremities.  Otherwise a detailed review of systems today was noncontributory  PAST MEDICAL HISTORY: Past Medical History  Diagnosis Date  . Migraines   . Cancer     breast  . Breast cancer   . Allergy     PAST SURGICAL HISTORY: Past Surgical History  Procedure Laterality Date  . Removal of cataracts    . Foot surgery  age 76  bone graft  . Portacath placement N/A 04/04/2013    Procedure: INSERTION PORT-A-CATH;  Surgeon: Currie Paris, MD;  Location: [facility];  Service: General;  Laterality: N/A;    FAMILY HISTORY Family History  Problem Relation Age of Onset  . Pancreatic cancer Mother   . Breast cancer Maternal Grandmother     the patient's father died at the age of 78 with pancreatic cancer. (He was Dr. Chrystine Oiler). [name] is alive at age 47. Janet [name] has one brother, no sisters. [name]'s mother died from breast cancer at the age of 27. There is no history of ovarian cancer in the family  GYNECOLOGIC HISTORY:  Menarche age 89, first live birth age 3. She is GX P2. She stopped having periods approximately 2004. She did not take hormone replacement. She did take birth control pills remotely for 5-10 years, with no complications.  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 Her husband the [place]. ARoxan Hockey "Robby" Mullens, is a local judge. The patient's 2 children are Miley who lives in [place] and works as a Financial risk analyst, and [place], who works in [place] and is an associate with a Set designer group.  ADVANCED DIRECTIVES: In place   HEALTH MAINTENANCE: (Updated 06/11/2013) History  Substance Use Topics  . Smoking status: Never Smoker   . Smokeless tobacco: Never Used  . Alcohol Use: Yes     Comment: occasional     Colonoscopy: Not on file  PAP:  Oct 2014, Dr. Valentina Lucks  Bone density: July 2013 at [facility],    osteopenia  Lipid panel: UTD, Dr. Valentina Lucks   Allergies  Allergen Reactions  . Penicillins   . Azithromycin Rash    Current Outpatient Prescriptions  Medication Sig Dispense Refill  . acetaminophen (TYLENOL) 325 MG tablet Take 650 mg by mouth every 6 (six) hours as needed for pain.      Marland Kitchen lidocaine-prilocaine (EMLA) cream Apply topically as needed.  30 g  0  . LORazepam (ATIVAN) 0.5 MG tablet       . Multiple Vitamin (MULTI-VITAMIN DAILY PO) Take by mouth daily.      . prochlorperazine (COMPAZINE) 10 MG tablet Take 1 tablet  (10 mg total) by mouth every 6 (six) hours as needed (Nausea or vomiting).  30 tablet  1  . SUMAtriptan (IMITREX) 50 MG tablet Take 1 tablet (50 mg total) by mouth every 2 (two) hours as needed for migraine. May repeat in 2 hours if headache persists or recurs.  10 tablet  0  . Alum & Mag Hydroxide-Simeth (MAGIC MOUTHWASH W/LIDOCAINE) SOLN Take 5 mLs by mouth 4 (four) times daily as needed. For throat or mouth pain  240 mL  1  . benzonatate (TESSALON) 100 MG capsule Take 1 capsule (100 mg total) by mouth 3 (three) times daily as needed for cough.  30 capsule  0  . CALCIUM PO Take by mouth daily.      Marland Kitchen doxycycline (VIBRA-TABS) 100 MG tablet Take 1 tablet (100 mg total) by mouth daily.  10 tablet  0  . methylPREDNIsolone (MEDROL DOSPACK) 4 MG tablet       . polyethylene glycol powder (MIRALAX) powder Take 17 g by mouth daily.  255  g  0   No current facility-administered medications for this visit.    OBJECTIVE: Middle-aged white woman in no acute distress  Filed Vitals:   06/25/13 1028  BP: 98/60  Pulse: 79  Temp: 98.6 F (37 C)  Resp: 18     Body mass index is 17.86 kg/(m^2).    ECOG FS:1 Filed Weights   06/25/13 1028  Weight: 121 lb (54.885 kg)   Physical Exam: HEENT:  Sclerae anicteric.  Oropharynx clear and moist. No ulcerations or evidence of candidiasis. NODES:  No cervical or supraclavicular lymphadenopathy palpated.  BREAST EXAM:  Deferred. Axillae are benign bilaterally, with no palpable lymphadenopathy. LUNGS:  Clear to auscultation bilaterally.  No wheezes or rhonchi. HEART:  Regular rate and rhythm. No murmur appreciated. ABDOMEN:  Soft, thin, nontender.  Positive bowel sounds.  MSK:  No focal spinal tenderness to palpation. Good range of motion in the upper extremities.  EXTREMITIES:  No peripheral edema.   SKIN:  Skin on the hands is extremely dry with some areas of cracking and peeling, especially around the nails and on the knuckles.Marland Kitchen NEURO:  Nonfocal. Well oriented.   Positive affect.   LAB RESULTS:  CMP     Component Value Date/Time   NA 138 06/18/2013 1401   K 4.3 06/18/2013 1401   CO2 22 06/18/2013 1401   GLUCOSE 97 06/18/2013 1401   BUN 18.4 06/18/2013 1401   CREATININE 0.7 06/18/2013 1401   CALCIUM 9.9 06/18/2013 1401   PROT 6.0* 06/18/2013 1401   ALBUMIN 3.8 06/18/2013 1401   AST 18 06/18/2013 1401   ALT 26 06/18/2013 1401   ALKPHOS 57 06/18/2013 1401   BILITOT 0.82 06/18/2013 1401     Lab Results  Component Value Date   WBC 4.4 06/25/2013   NEUTROABS 3.3 06/25/2013   HGB 10.5* 06/25/2013   HCT 31.0* 06/25/2013   MCV 97.2 06/25/2013   PLT 189 06/25/2013      STUDIES:  Mr Breast Bilateral W Wo Contrast  06/05/2013   CLINICAL DATA:  Patient with right breast carcinoma being treated with neoadjuvant chemotherapy. Re-evaluate tumor size.  EXAM: MR BILATERAL BREAST WITHOUT AND WITH CONTRAST  TECHNIQUE: Multiplanar, multisequence MR images of both breasts were obtained prior to and following the intravenous administration of 11ml of MultiHance.  THREE-DIMENSIONAL MR IMAGE RENDERING ON INDEPENDENT WORKSTATION:  Three-dimensional MR images were rendered by post-processing of the original MR data on an independent workstation. The three-dimensional MR images were interpreted, and findings are reported in the following complete MRI report for this study.  COMPARISON:  Previous exams  FINDINGS: Breast composition: d. Extreme fibroglandular tissue  Background parenchymal enhancement: Moderate  Right breast: Very minimal residual punctate enhancement is demonstrated within the subareolar region of the right breast at the site of biopsied right breast malignancy. Scattered foci of enhancement within the right breast.  Left breast: No mass or abnormal enhancement.  Lymph nodes: No abnormal appearing lymph nodes.  Ancillary findings:  None.  IMPRESSION: Very minimal residual punctate enhancement is demonstrated at the site of previously biopsied right  breast malignancy, compatible with treatment response.  RECOMMENDATION: Treatment plan.  BI-RADS CATEGORY  6: Known biopsy-proven malignancy - appropriate action should be taken.   Electronically Signed   By: Annia Belt M.D.   On: 06/05/2013 16:42    ASSESSMENT: 57 y.o. [place] woman   (1)  status post right breast biopsy 03/19/2013 for a clinical T2 N0, stage IIA invasive ductal breast cancer, grade 3, triple  negative, with an MIB-1 of 54%.  (2) suspicious right axillary lymph node biopsy negative  (3) received four cycles of dose dense doxorubicin/ cyclophosphamide between 04/17/2013 and 05/29/2013 , at standard doses, with Neulasta support;  (4) on  06/12/2013 starting 12 weekly doses of carboplatin/ paclitaxel; will skip the Christmas week dose (add at the end)   (5) anticipate definitive surgery 09/30/2013 (Spring Break week)   PLAN: [name] will receive her third weekly dose of carboplatin/paclitaxel today as scheduled. We discussed holding treatment today to allow her hands still a little more, but [name] is ready to proceed. She tells me she can "deal with this" and that it is not particularly problematic at this point. She'll continue to moisturize her hands and feet extremely well as noted above.   I will continue seeing her on a weekly basis. She will see me next week on December night prior to her next dose of chemotherapy on December 10. I will mention again that we are skipping treatment on December 23 due to the Christmas holiday, and she will then resume treatment the following week. We have reviewed her scheduled today through the end of January to accommodate her busy work schedule.  She voices understanding and agreement with the above plan, and knows to call with any changes or problems prior to her appointment with me next week.   Lashawnta Burgert, PA-C   06/26/2013 5:02 PM

## 2013-06-28 ENCOUNTER — Encounter: Payer: Self-pay | Admitting: Oncology

## 2013-07-02 ENCOUNTER — Other Ambulatory Visit: Payer: Self-pay | Admitting: Oncology

## 2013-07-02 ENCOUNTER — Encounter: Payer: Self-pay | Admitting: Physician Assistant

## 2013-07-02 ENCOUNTER — Ambulatory Visit: Payer: BC Managed Care – PPO

## 2013-07-02 ENCOUNTER — Ambulatory Visit (HOSPITAL_BASED_OUTPATIENT_CLINIC_OR_DEPARTMENT_OTHER): Payer: BC Managed Care – PPO | Admitting: Physician Assistant

## 2013-07-02 ENCOUNTER — Other Ambulatory Visit (HOSPITAL_BASED_OUTPATIENT_CLINIC_OR_DEPARTMENT_OTHER): Payer: BC Managed Care – PPO | Admitting: Lab

## 2013-07-02 VITALS — BP 106/67 | HR 74 | Temp 98.4°F | Resp 18 | Ht 69.0 in | Wt 120.3 lb

## 2013-07-02 DIAGNOSIS — L309 Dermatitis, unspecified: Secondary | ICD-10-CM

## 2013-07-02 DIAGNOSIS — R21 Rash and other nonspecific skin eruption: Secondary | ICD-10-CM

## 2013-07-02 DIAGNOSIS — C50919 Malignant neoplasm of unspecified site of unspecified female breast: Secondary | ICD-10-CM

## 2013-07-02 DIAGNOSIS — C50411 Malignant neoplasm of upper-outer quadrant of right female breast: Secondary | ICD-10-CM

## 2013-07-02 DIAGNOSIS — C50419 Malignant neoplasm of upper-outer quadrant of unspecified female breast: Secondary | ICD-10-CM

## 2013-07-02 DIAGNOSIS — Z171 Estrogen receptor negative status [ER-]: Secondary | ICD-10-CM

## 2013-07-02 LAB — CBC WITH DIFFERENTIAL/PLATELET
BASO%: 1.6 % (ref 0.0–2.0)
Eosinophils Absolute: 0.1 10*3/uL (ref 0.0–0.5)
HCT: 30 % — ABNORMAL LOW (ref 34.8–46.6)
LYMPH%: 24.5 % (ref 14.0–49.7)
MCHC: 34.2 g/dL (ref 31.5–36.0)
MCV: 97.9 fL (ref 79.5–101.0)
MONO%: 13.1 % (ref 0.0–14.0)
NEUT%: 57.4 % (ref 38.4–76.8)
Platelets: 159 10*3/uL (ref 145–400)
RBC: 3.06 10*6/uL — ABNORMAL LOW (ref 3.70–5.45)
WBC: 2.3 10*3/uL — ABNORMAL LOW (ref 3.9–10.3)

## 2013-07-02 LAB — COMPREHENSIVE METABOLIC PANEL (CC13)
Albumin: 4 g/dL (ref 3.5–5.0)
Alkaline Phosphatase: 50 U/L (ref 40–150)
Anion Gap: 9 mEq/L (ref 3–11)
BUN: 13.2 mg/dL (ref 7.0–26.0)
Chloride: 107 mEq/L (ref 98–109)
Creatinine: 0.7 mg/dL (ref 0.6–1.1)
Glucose: 99 mg/dl (ref 70–140)
Sodium: 140 mEq/L (ref 136–145)
Total Bilirubin: 0.41 mg/dL (ref 0.20–1.20)
Total Protein: 6.2 g/dL — ABNORMAL LOW (ref 6.4–8.3)

## 2013-07-02 NOTE — Progress Notes (Signed)
Patient ID: Janet [name], female   DOB: 09-14-1955, 57 y.o.   MRN: [id number] ID: Janet [name] OB: 08/14/1955  MR#: [id number]  EXB#:[id number]  PCP: Astrid Divine, MD GYN:   Janeece RiggersCicero Duck; Ovidio Kin OTHER MD: Chipper Herb, Maryelizabeth Kaufmann  CHIEF COMPLAINT:  Right Breast Cancer/Neoadjuvant Chemo   HISTORY OF PRESENT ILLNESS: "Janet [name]" herself noted a lump in her right breast on 03/17/2013, and immediately contacted her physician so that on 03/18/2013 bilateral diagnostic mammography and right breast ultrasonography at the breast Center showed a developing mass in the subareolar region of the right breast. There were no malignant type calcifications. This mass was palpable in the upper outer quadrant, and by ultrasound measured 2.6 cm. Ultrasonography of the right axilla showed a lymph node measuring 1.1 cm, with a thickened cortex. The left breast was unremarkable.  Biopsy of the right breast mass 08/26//2014 showed ([name]) and invasive ductal carcinoma, grade 3, triple negative, with an MIB-1 of 54%. Biopsy of the right axillary lymph node in question at the same time was negative.  Bilateral breast MRI 03/26/2013 measured the right subareolar mass at 3.8 cm. The previously biopsied benign right axillary lymph node was again noted. There were no other suspicious areas in the right breast or axilla and the left breast was unremarkable.  Her subsequent history is as detailed below  INTERVAL HISTORY: [name] returns alone today for followup of her locally advanced right breast cancer. She receives weekly carboplatin/paclitaxel and is due for her 4th of 12 planned weekly doses tomorrow, 07/03/2013.    Interval history is notable for some continued skin change.  Janet [name] has had some redness, dryness, and slight cracking in the skin of the hands and feet since completing her doxorubicin/cyclophosphamide. (This was present prior to beginning the carboplatin/paclitaxel.) The feet  have improved significantly. She still has some redness and dryness on the dorsal surface of the hands bilaterally, around the knuckles.  Over the past 2-3 days, she has also noted some skin changes and increased itching on her arms. The skin is very dry and "bumpy", but there is no obvious rash.  She is using gentle, hypoallergenic soaps, lotions, and household products. She has had no known exposures to new chemicals or substances.  REVIEW OF SYSTEMS: [name] has had no fevers, chills, or night sweats.  She's had no mouth ulcers or oral sensitivity. Her mouth is dry, and her throat gets sore at night. Her energy level is lower than usual for her, but she is still running on a daily basis, and has been working full-time. She is eating well and denies any problems with nausea or emesis. She denies any change in bowel or bladder habits. She's had no additional problems with diarrhea. She has no cough, and has had no increased shortness of breath, chest pain, or palpitations.  She still has some occasional migraine headaches, but these have lessened significantly. She's had no dizziness or change in vision.   She also denies any new or unusual myalgias, arthralgias, or bony pain, and has had no peripheral swelling. She continues to deny any signs of numbness or tingling in the upper or lower extremities.  Otherwise a detailed review of systems today was noncontributory  PAST MEDICAL HISTORY: Past Medical History  Diagnosis Date  . Migraines   . Cancer     breast  . Breast cancer   . Allergy     PAST SURGICAL HISTORY: Past Surgical History  Procedure Laterality Date  .  Removal of cataracts    . Foot surgery  age 63    bone graft  . Portacath placement N/A 04/04/2013    Procedure: INSERTION PORT-A-CATH;  Surgeon: Currie Paris, MD;  Location: [facility];  Service: General;  Laterality: N/A;    FAMILY HISTORY Family History  Problem Relation Age of Onset  . Pancreatic cancer Mother   .  Breast cancer Maternal Grandmother    the patient's father died at the age of 54 with pancreatic cancer. (He was Dr. Chrystine Oiler). [name] is alive at age 36. Sallye Ober has one brother, no sisters. [name]'s mother died from breast cancer at the age of 57. There is no history of ovarian cancer in the family  GYNECOLOGIC HISTORY:  Menarche age 25, first live birth age 22. She is GX P2. She stopped having periods approximately 2004. She did not take hormone replacement. She did take birth control pills remotely for 5-10 years, with no complications.  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 Her husband the [place]. ARoxan Hockey "Robby" Soller, is a local judge. The patient's 2 children are Miley who lives in [place] and works as a Financial risk analyst, and [place], who works in [place] and is an associate with a Set designer group.  ADVANCED DIRECTIVES: In place   HEALTH MAINTENANCE: (Updated 06/11/2013) History  Substance Use Topics  . Smoking status: Never Smoker   . Smokeless tobacco: Never Used  . Alcohol Use: Yes     Comment: occasional     Colonoscopy: Not on file  PAP:  Oct 2014, Dr. Valentina Lucks  Bone density: July 2013 at [facility],    osteopenia  Lipid panel: UTD, Dr. Valentina Lucks   Allergies  Allergen Reactions  . Penicillins   . Azithromycin Rash    Current Outpatient Prescriptions  Medication Sig Dispense Refill  . acetaminophen (TYLENOL) 325 MG tablet Take 650 mg by mouth every 6 (six) hours as needed for pain.      Marland Kitchen lidocaine-prilocaine (EMLA) cream Apply topically as needed.  30 g  0  . LORazepam (ATIVAN) 0.5 MG tablet       . Multiple Vitamin (MULTI-VITAMIN DAILY PO) Take by mouth daily.      . SUMAtriptan (IMITREX)  50 MG tablet Take 1 tablet (50 mg total) by mouth every 2 (two) hours as needed for migraine. May repeat in 2 hours if headache persists or recurs.  10 tablet  0  . Alum & Mag Hydroxide-Simeth (MAGIC MOUTHWASH W/LIDOCAINE) SOLN Take 5 mLs by mouth 4 (four) times daily as needed. For throat or mouth pain  240 mL  1  . benzonatate (TESSALON) 100 MG capsule Take 1 capsule (100 mg total) by mouth 3 (three) times daily as needed for cough.  30 capsule  0  . CALCIUM PO Take by mouth daily.      Marland Kitchen doxycycline (VIBRA-TABS) 100 MG tablet Take 1 tablet (100 mg total) by mouth daily.  10 tablet  0  . polyethylene glycol powder (MIRALAX) powder Take 17 g by mouth daily.  255 g  0  . prochlorperazine (COMPAZINE) 10 MG tablet Take 1 tablet (10 mg total) by mouth every 6 (six) hours as needed (Nausea or vomiting).  30 tablet  1   No current facility-administered medications for this visit.    OBJECTIVE: Middle-aged white woman in no acute distress  Filed Vitals:   07/02/13 1349  BP: 106/67  Pulse: 74  Temp: 98.4 F (36.9 C)  Resp: 18     Body mass index is 17.76 kg/(m^2).    ECOG FS:1 Filed Weights   07/02/13 1349  Weight: 120 lb 4.8 oz (54.568 kg)   Physical Exam: HEENT:  Sclerae anicteric.  Oropharynx clear. No ulcerations or evidence of candidiasis. No pharyngeal erythema. NODES:  No cervical or supraclavicular lymphadenopathy palpated.  BREAST EXAM:  Palpable thickening in the right breast, upper outer quadrant, but difficult to palpate a distinct, measurable mass. No obvious skin changes and no nipple inversion. Left breast is unremarkable. Axillae are benign bilaterally, with no palpable lymphadenopathy. LUNGS:  Clear to auscultation bilaterally with good excursion.  No wheezes or rhonchi. HEART:  Regular rate and rhythm. No murmur appreciated. ABDOMEN:  Soft, thin, nontender. No organomegaly palpated. Positive bowel sounds.  MSK:  No focal spinal tenderness to palpation. Good range of motion  in the upper extremities.  EXTREMITIES:  No peripheral edema.   SKIN:  Skin on the hands is dry and erythematous bilaterally on the dorsal surfaces, especially around the knuckles. No obvious cracking or peeling is noted. The skin is diffusely dry, especially on the back where it is slightly flaky. The skin on the underneath sides of the arms is also dry, and feels bumpy and rough to palpation, although there is no visible rash noted. NEURO:  Nonfocal. Well oriented.  Positive affect.   LAB RESULTS:  CMP     Component Value Date/Time   NA 140 07/02/2013 1340   K 3.9 07/02/2013 1340   CO2 24 07/02/2013 1340   GLUCOSE 99 07/02/2013 1340   BUN 13.2 07/02/2013 1340   CREATININE 0.7 07/02/2013 1340   CALCIUM 10.5* 07/02/2013 1340   PROT 6.2* 07/02/2013 1340   ALBUMIN 4.0 07/02/2013 1340   AST 26 07/02/2013 1340   ALT 34 07/02/2013 1340   ALKPHOS 50 07/02/2013 1340   BILITOT 0.41 07/02/2013 1340     Lab Results  Component Value Date   WBC 2.3* 07/02/2013   NEUTROABS 1.3* 07/02/2013   HGB 10.3* 07/02/2013   HCT 30.0* 07/02/2013   MCV 97.9 07/02/2013   PLT 159 07/02/2013      STUDIES:  Mr Breast Bilateral W Wo Contrast  06/05/2013   CLINICAL DATA:  Patient with right breast carcinoma being treated with neoadjuvant chemotherapy. Re-evaluate tumor size.  EXAM: MR BILATERAL BREAST WITHOUT AND WITH CONTRAST  TECHNIQUE: Multiplanar, multisequence MR images of both breasts were obtained prior to and following the intravenous administration of 11ml of MultiHance.  THREE-DIMENSIONAL MR IMAGE RENDERING ON INDEPENDENT WORKSTATION:  Three-dimensional MR images were rendered by post-processing of the original MR data on an independent workstation. The three-dimensional MR images were interpreted, and findings are reported in the following complete MRI report for this study.  COMPARISON:  Previous exams  FINDINGS: Breast composition: d. Extreme fibroglandular tissue  Background parenchymal enhancement: Moderate   Right breast: Very minimal residual punctate enhancement is demonstrated within the subareolar region of the right breast at the site of biopsied right breast malignancy. Scattered foci of enhancement within the right breast.  Left breast: No mass or abnormal enhancement.  Lymph nodes: No abnormal appearing lymph nodes.  Ancillary findings:  None.  IMPRESSION: Very minimal residual punctate enhancement is demonstrated  at the site of previously biopsied right breast malignancy, compatible with treatment response.  RECOMMENDATION: Treatment plan.  BI-RADS CATEGORY  6: Known biopsy-proven malignancy - appropriate action should be taken.   Electronically Signed   By: Annia Belt M.D.   On: 06/05/2013 16:42    ASSESSMENT: 57 y.o. [place] woman   (1)  status post right breast biopsy 03/19/2013 for a clinical T2 N0, stage IIA invasive ductal breast cancer, grade 3, triple negative, with an MIB-1 of 54%.  (2) suspicious right axillary lymph node biopsy negative  (3) received four cycles of dose dense doxorubicin/ cyclophosphamide between 04/17/2013 and 05/29/2013 , at standard doses, with Neulasta support;  (4) on  06/12/2013 starting 12 weekly doses of carboplatin/ paclitaxel; will skip the Christmas week dose (add at the end)   (5) anticipate definitive surgery 09/30/2013 (Spring Break week)   PLAN: [name] will return tomorrow on December 10 as scheduled for her fourth weekly dose of carboplatin/paclitaxel. Her ANC was a little low today at 1.3, but we will continue to treat on a weekly basis as long as it is greater than or equal to 1.0. She does know to contact us with any fevers of 100.3 or above.   She is scheduled for her fifth dose next week on December 16 and will then take a one-week break the following week due to the Christmas holiday.   I have recommended that she try topical creams including Sarna and hydrocortisone cream on the back in the arms. She can also take a low dose of Benadryl  if the itching is especially bothersome. I have asked her, however, to contact us if any of the skin changes worsen or become more problematic.  Janet [name] voices her understanding and agreement with the above plan and will call with any problems prior to her scheduled appointment here on December 16.    Greene Diodato, PA-C   07/02/2013 2:59 PM

## 2013-07-03 ENCOUNTER — Ambulatory Visit (HOSPITAL_BASED_OUTPATIENT_CLINIC_OR_DEPARTMENT_OTHER): Payer: BC Managed Care – PPO

## 2013-07-03 VITALS — BP 212/54 | HR 69 | Temp 98.0°F

## 2013-07-03 DIAGNOSIS — C50411 Malignant neoplasm of upper-outer quadrant of right female breast: Secondary | ICD-10-CM

## 2013-07-03 DIAGNOSIS — C50419 Malignant neoplasm of upper-outer quadrant of unspecified female breast: Secondary | ICD-10-CM

## 2013-07-03 DIAGNOSIS — Z5111 Encounter for antineoplastic chemotherapy: Secondary | ICD-10-CM

## 2013-07-03 MED ORDER — HEPARIN SOD (PORK) LOCK FLUSH 100 UNIT/ML IV SOLN
500.0000 [IU] | Freq: Once | INTRAVENOUS | Status: AC | PRN
  Administered 2013-07-03: 500 [IU]
  Filled 2013-07-03: qty 5

## 2013-07-03 MED ORDER — SODIUM CHLORIDE 0.9 % IJ SOLN
10.0000 mL | INTRAMUSCULAR | Status: DC | PRN
  Administered 2013-07-03: 10 mL
  Filled 2013-07-03: qty 10

## 2013-07-03 MED ORDER — FAMOTIDINE IN NACL 20-0.9 MG/50ML-% IV SOLN
20.0000 mg | Freq: Once | INTRAVENOUS | Status: AC
  Administered 2013-07-03: 20 mg via INTRAVENOUS

## 2013-07-03 MED ORDER — ONDANSETRON 16 MG/50ML IVPB (CHCC)
16.0000 mg | Freq: Once | INTRAVENOUS | Status: AC
  Administered 2013-07-03: 16 mg via INTRAVENOUS

## 2013-07-03 MED ORDER — SODIUM CHLORIDE 0.9 % IV SOLN
80.0000 mg/m2 | Freq: Once | INTRAVENOUS | Status: AC
  Administered 2013-07-03: 132 mg via INTRAVENOUS
  Filled 2013-07-03: qty 22

## 2013-07-03 MED ORDER — DEXAMETHASONE SODIUM PHOSPHATE 20 MG/5ML IJ SOLN
INTRAMUSCULAR | Status: AC
  Filled 2013-07-03: qty 5

## 2013-07-03 MED ORDER — FAMOTIDINE IN NACL 20-0.9 MG/50ML-% IV SOLN
INTRAVENOUS | Status: AC
  Filled 2013-07-03: qty 50

## 2013-07-03 MED ORDER — ONDANSETRON 16 MG/50ML IVPB (CHCC)
INTRAVENOUS | Status: AC
  Filled 2013-07-03: qty 16

## 2013-07-03 MED ORDER — DIPHENHYDRAMINE HCL 50 MG/ML IJ SOLN
INTRAMUSCULAR | Status: AC
  Filled 2013-07-03: qty 1

## 2013-07-03 MED ORDER — DIPHENHYDRAMINE HCL 50 MG/ML IJ SOLN
25.0000 mg | Freq: Once | INTRAMUSCULAR | Status: AC
  Administered 2013-07-03: 25 mg via INTRAVENOUS

## 2013-07-03 MED ORDER — DEXAMETHASONE SODIUM PHOSPHATE 20 MG/5ML IJ SOLN
20.0000 mg | Freq: Once | INTRAMUSCULAR | Status: AC
  Administered 2013-07-03: 20 mg via INTRAVENOUS

## 2013-07-03 MED ORDER — SODIUM CHLORIDE 0.9 % IV SOLN
207.6000 mg | Freq: Once | INTRAVENOUS | Status: AC
  Administered 2013-07-03: 210 mg via INTRAVENOUS
  Filled 2013-07-03: qty 21

## 2013-07-03 MED ORDER — SODIUM CHLORIDE 0.9 % IV SOLN
Freq: Once | INTRAVENOUS | Status: AC
  Administered 2013-07-03: 13:00:00 via INTRAVENOUS

## 2013-07-03 NOTE — Patient Instructions (Signed)
[facility] Discharge Instructions for Patients Receiving Chemotherapy  Today you received the following chemotherapy agents: Taxol and Carboplatin.   To help prevent nausea and vomiting after your treatment, we encourage you to take your nausea medication as directed.    If you develop nausea and vomiting that is not controlled by your nausea medication, call the clinic.   BELOW ARE SYMPTOMS THAT SHOULD BE REPORTED IMMEDIATELY:  *FEVER GREATER THAN 100.5 F  *CHILLS WITH OR WITHOUT FEVER  NAUSEA AND VOMITING THAT IS NOT CONTROLLED WITH YOUR NAUSEA MEDICATION  *UNUSUAL SHORTNESS OF BREATH  *UNUSUAL BRUISING OR BLEEDING  TENDERNESS IN MOUTH AND THROAT WITH OR WITHOUT PRESENCE OF ULCERS  *URINARY PROBLEMS  *BOWEL PROBLEMS  UNUSUAL RASH Items with * indicate a potential emergency and should be followed up as soon as possible.  Feel free to call the clinic you have any questions or concerns. The clinic phone number is [phone].    

## 2013-07-09 ENCOUNTER — Ambulatory Visit (HOSPITAL_BASED_OUTPATIENT_CLINIC_OR_DEPARTMENT_OTHER): Payer: BC Managed Care – PPO

## 2013-07-09 ENCOUNTER — Ambulatory Visit (HOSPITAL_BASED_OUTPATIENT_CLINIC_OR_DEPARTMENT_OTHER): Payer: BC Managed Care – PPO | Admitting: Physician Assistant

## 2013-07-09 ENCOUNTER — Other Ambulatory Visit: Payer: Self-pay | Admitting: Oncology

## 2013-07-09 ENCOUNTER — Ambulatory Visit: Payer: BC Managed Care – PPO | Admitting: Physician Assistant

## 2013-07-09 ENCOUNTER — Other Ambulatory Visit: Payer: BC Managed Care – PPO | Admitting: Lab

## 2013-07-09 ENCOUNTER — Other Ambulatory Visit (HOSPITAL_BASED_OUTPATIENT_CLINIC_OR_DEPARTMENT_OTHER): Payer: BC Managed Care – PPO

## 2013-07-09 ENCOUNTER — Encounter: Payer: Self-pay | Admitting: Physician Assistant

## 2013-07-09 ENCOUNTER — Telehealth: Payer: Self-pay | Admitting: Oncology

## 2013-07-09 VITALS — BP 111/62 | HR 76 | Temp 97.9°F | Resp 18 | Ht 69.0 in | Wt 119.5 lb

## 2013-07-09 DIAGNOSIS — D696 Thrombocytopenia, unspecified: Secondary | ICD-10-CM

## 2013-07-09 DIAGNOSIS — Z5111 Encounter for antineoplastic chemotherapy: Secondary | ICD-10-CM

## 2013-07-09 DIAGNOSIS — C50419 Malignant neoplasm of upper-outer quadrant of unspecified female breast: Secondary | ICD-10-CM

## 2013-07-09 DIAGNOSIS — C50411 Malignant neoplasm of upper-outer quadrant of right female breast: Secondary | ICD-10-CM

## 2013-07-09 DIAGNOSIS — G4700 Insomnia, unspecified: Secondary | ICD-10-CM

## 2013-07-09 DIAGNOSIS — G43909 Migraine, unspecified, not intractable, without status migrainosus: Secondary | ICD-10-CM

## 2013-07-09 DIAGNOSIS — C50919 Malignant neoplasm of unspecified site of unspecified female breast: Secondary | ICD-10-CM

## 2013-07-09 DIAGNOSIS — D649 Anemia, unspecified: Secondary | ICD-10-CM | POA: Insufficient documentation

## 2013-07-09 DIAGNOSIS — Z171 Estrogen receptor negative status [ER-]: Secondary | ICD-10-CM

## 2013-07-09 HISTORY — DX: Anemia, unspecified: D64.9

## 2013-07-09 LAB — CBC WITH DIFFERENTIAL/PLATELET
Basophils Absolute: 0 10*3/uL (ref 0.0–0.1)
EOS%: 1.1 % (ref 0.0–7.0)
Eosinophils Absolute: 0 10*3/uL (ref 0.0–0.5)
HCT: 30.1 % — ABNORMAL LOW (ref 34.8–46.6)
HGB: 10.3 g/dL — ABNORMAL LOW (ref 11.6–15.9)
LYMPH%: 23.4 % (ref 14.0–49.7)
MCH: 33.6 pg (ref 25.1–34.0)
MCHC: 34.2 g/dL (ref 31.5–36.0)
MCV: 98.1 fL (ref 79.5–101.0)
MONO%: 4.9 % (ref 0.0–14.0)
NEUT#: 2.6 10*3/uL (ref 1.5–6.5)
NEUT%: 69.8 % (ref 38.4–76.8)
Platelets: 139 10*3/uL — ABNORMAL LOW (ref 145–400)

## 2013-07-09 MED ORDER — DEXAMETHASONE SODIUM PHOSPHATE 20 MG/5ML IJ SOLN
INTRAMUSCULAR | Status: AC
  Filled 2013-07-09: qty 5

## 2013-07-09 MED ORDER — DEXAMETHASONE SODIUM PHOSPHATE 20 MG/5ML IJ SOLN
20.0000 mg | Freq: Once | INTRAMUSCULAR | Status: AC
  Administered 2013-07-09: 20 mg via INTRAVENOUS

## 2013-07-09 MED ORDER — SODIUM CHLORIDE 0.9 % IV SOLN
207.6000 mg | Freq: Once | INTRAVENOUS | Status: AC
  Administered 2013-07-09: 210 mg via INTRAVENOUS
  Filled 2013-07-09: qty 21

## 2013-07-09 MED ORDER — HEPARIN SOD (PORK) LOCK FLUSH 100 UNIT/ML IV SOLN
500.0000 [IU] | Freq: Once | INTRAVENOUS | Status: AC | PRN
  Administered 2013-07-09: 500 [IU]
  Filled 2013-07-09: qty 5

## 2013-07-09 MED ORDER — ONDANSETRON 16 MG/50ML IVPB (CHCC)
16.0000 mg | Freq: Once | INTRAVENOUS | Status: AC
  Administered 2013-07-09: 16 mg via INTRAVENOUS

## 2013-07-09 MED ORDER — SODIUM CHLORIDE 0.9 % IV SOLN
80.0000 mg/m2 | Freq: Once | INTRAVENOUS | Status: AC
  Administered 2013-07-09: 132 mg via INTRAVENOUS
  Filled 2013-07-09: qty 22

## 2013-07-09 MED ORDER — SODIUM CHLORIDE 0.9 % IJ SOLN
10.0000 mL | INTRAMUSCULAR | Status: DC | PRN
  Administered 2013-07-09: 10 mL
  Filled 2013-07-09: qty 10

## 2013-07-09 MED ORDER — SUMATRIPTAN SUCCINATE 50 MG PO TABS: 50.0000 mg | ORAL_TABLET | ORAL | Status: DC | PRN

## 2013-07-09 MED ORDER — FAMOTIDINE IN NACL 20-0.9 MG/50ML-% IV SOLN
20.0000 mg | Freq: Once | INTRAVENOUS | Status: AC
  Administered 2013-07-09: 20 mg via INTRAVENOUS

## 2013-07-09 MED ORDER — SODIUM CHLORIDE 0.9 % IV SOLN
Freq: Once | INTRAVENOUS | Status: AC
  Administered 2013-07-09: 13:00:00 via INTRAVENOUS

## 2013-07-09 MED ORDER — DIPHENHYDRAMINE HCL 50 MG/ML IJ SOLN
25.0000 mg | Freq: Once | INTRAMUSCULAR | Status: AC
  Administered 2013-07-09: 25 mg via INTRAVENOUS

## 2013-07-09 MED ORDER — DIPHENHYDRAMINE HCL 50 MG/ML IJ SOLN
INTRAMUSCULAR | Status: AC
  Filled 2013-07-09: qty 1

## 2013-07-09 MED ORDER — ONDANSETRON 16 MG/50ML IVPB (CHCC)
INTRAVENOUS | Status: AC
  Filled 2013-07-09: qty 16

## 2013-07-09 MED ORDER — FAMOTIDINE IN NACL 20-0.9 MG/50ML-% IV SOLN
INTRAVENOUS | Status: AC
  Filled 2013-07-09: qty 50

## 2013-07-09 MED ORDER — LORAZEPAM 0.5 MG PO TABS: 0.5000 mg | ORAL_TABLET | Freq: Every evening | ORAL | Status: DC | PRN

## 2013-07-09 NOTE — Telephone Encounter (Signed)
gv and printed appt sched and avs for pt for DEc, Jan and FEb 2015...sed added tx.

## 2013-07-09 NOTE — Progress Notes (Signed)
Patient ID: Janet [name], female   DOB: 11/18/1955, 57 y.o.   MRN: [id number] ID: [name] OB: 1956/01/05  MR#: [id number]  LKG#:[id number]  PCP: Astrid Divine, MD GYN:   Janeece RiggersCicero Duck; Ovidio Kin OTHER MD: Chipper Herb, Maryelizabeth Kaufmann  CHIEF COMPLAINT:  Right Breast Cancer/Neoadjuvant Chemo   HISTORY OF PRESENT ILLNESS: "Janet [name]" herself noted a lump in her right breast on 03/17/2013, and immediately contacted her physician so that on 03/18/2013 bilateral diagnostic mammography and right breast ultrasonography at the breast Center showed a developing mass in the subareolar region of the right breast. There were no malignant type calcifications. This mass was palpable in the upper outer quadrant, and by ultrasound measured 2.6 cm. Ultrasonography of the right axilla showed a lymph node measuring 1.1 cm, with a thickened cortex. The left breast was unremarkable.  Biopsy of the right breast mass 08/26//2014 showed ([name]) and invasive ductal carcinoma, grade 3, triple negative, with an MIB-1 of 54%. Biopsy of the right axillary lymph node in question at the same time was negative.  Bilateral breast MRI 03/26/2013 measured the right subareolar mass at 3.8 cm. The previously biopsied benign right axillary lymph node was again noted. There were no other suspicious areas in the right breast or axilla and the left breast was unremarkable.  Her subsequent history is as detailed below  INTERVAL HISTORY: [name] returns alone today for followup of her locally advanced right breast cancer. She receives weekly carboplatin/paclitaxel and is due for her 5th of 12 planned weekly doses today.   Interval history is generally unremarkable. She continues to have some mild cracking and peeling on her hands and fingers, but this is improving. Her fingertips feel slightly sensitive due to the skin changes, but fortunately she denies any problems with numbness, tingling, or decreased  sensation in either the upper or lower extremities. When I saw her last week she was having problems with a new rash on her arms, associated with dryness and itching. She's been using  Sarna c and this has improved significantly.   REVIEW OF SYSTEMS: [name] has had no fevers, chills, or night sweats.  She's had no mouth ulcers or oral sensitivity and denies any problems swallowing.  Her energy level is good. She is eating well and denies any problems with nausea or emesis. She denies any change in bowel or bladder habits.  She has no cough, and has had no increased shortness of breath, chest pain, or palpitations.  She  has a history of migraine headaches, and these have occurred more frequently throughout chemotherapy. She does need a refill on the Imitrex today. She also uses lorazepam occasionally at night to help her sleep, and that also needs to be refilled.  She's had no dizziness or change in vision.   She also denies any new or unusual myalgias, arthralgias, or bony pain, and has had no peripheral swelling.   Otherwise a detailed review of systems today was noncontributory  PAST MEDICAL HISTORY: Past Medical History  Diagnosis Date  . Migraines   . Cancer     breast  . Breast cancer   . Allergy   . Anemia, unspecified 07/09/2013    PAST SURGICAL HISTORY: Past Surgical History  Procedure Laterality Date  . Removal of cataracts    . Foot surgery  age 36    bone graft  . Portacath placement N/A 04/04/2013    Procedure: INSERTION PORT-A-CATH;  Surgeon: Currie Paris, MD;  Location: [facility];  Service: General;  Laterality: N/A;    FAMILY HISTORY Family History  Problem Relation Age of Onset  . Pancreatic cancer Mother   . Breast cancer Maternal Grandmother    the patient's father died at the age of 82 with pancreatic cancer. (He was Dr. Chrystine [name]). [name] is alive at age 20. Janet [name] has one brother, no sisters. [name]'s mother died from breast  cancer at the age of 55. There is no history of ovarian cancer in the family  GYNECOLOGIC HISTORY:  Menarche age 11, first live birth age 34. She is GX P2. She stopped having periods approximately 2004. She did not take hormone replacement. She did take birth control pills remotely for 5-10 years, with no complications.  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 Her husband the [place]. [place]an Hockey "Robby" Tonkovich, is a local judge. The patient's 2 children are Miley who lives in [place] and works as a Financial risk analyst, and [place] I, who works in [place] and is an associate with a Set designer group.  ADVANCED DIRECTIVES: In place   HEALTH MAINTENANCE: (Updated 06/11/2013) History  Substance Use Topics  . Smoking status: Never Smoker   . Smokeless tobacco: Never Used  . Alcohol Use: Yes     Comment: occasional     Colonoscopy: Not on file  PAP:  Oct 2014, Dr. Valentina Lucks  Bone density: July 2013 at [facility],    osteopenia  Lipid panel: UTD, Dr. Valentina Lucks   Allergies  Allergen Reactions  . Penicillins   . Azithromycin Rash    Current Outpatient Prescriptions  Medication Sig Dispense Refill  . acetaminophen (TYLENOL) 325 MG tablet Take 650 mg by mouth every 6 (six) hours as needed for pain.      Marland Kitchen Alum & Mag Hydroxide-Simeth (MAGIC MOUTHWASH W/LIDOCAINE) SOLN Take 5 mLs by mouth 4 (four) times daily as needed. For throat or mouth pain  240 mL  1  . CALCIUM PO Take by mouth daily.      Marland Kitchen lidocaine-prilocaine (EMLA) cream Apply topically as needed.  30 g  0  . LORazepam (ATIVAN) 0.5 MG tablet Take 1 tablet (0.5 mg total) by mouth at bedtime as needed for anxiety or sleep (or nausea).  30 tablet  0  . Multiple Vitamin (MULTI-VITAMIN DAILY PO) Take by mouth  daily.      . SUMAtriptan (IMITREX) 50 MG tablet Take 1 tablet (50 mg total) by mouth every 2 (two) hours as needed for migraine. May repeat in 2 hours if headache persists or recurs.  10 tablet  0  . benzonatate (TESSALON) 100 MG capsule Take 1 capsule (100 mg total) by mouth 3 (three) times daily as needed for cough.  30 capsule  0  . polyethylene glycol powder (MIRALAX) powder Take 17 g by mouth daily.  255 g  0  . prochlorperazine (COMPAZINE) 10 MG tablet Take 1 tablet (10 mg total) by mouth every 6 (six) hours as needed (Nausea or vomiting).  30 tablet  1   No current facility-administered medications for this visit.    OBJECTIVE: Middle-aged white woman in no acute distress  Filed Vitals:   07/09/13 1124  BP: 111/62  Pulse: 76  Temp: 97.9 F (36.6  C)  Resp: 18     Body mass index is 17.64 kg/(m^2).    ECOG FS:1 Filed Weights   07/09/13 1124  Weight: 119 lb 8 oz (54.205 kg)   Physical Exam: HEENT:  Sclerae anicteric.  Oropharynx clear and moist. No ulcerations or evidence of candidiasis.  neck is supple  NODES:  No cervical or supraclavicular lymphadenopathy palpated.  BREAST EXAM:  deferred.  Axillae are benign bilaterally, with no palpable lymphadenopathy. LUNGS:  Clear to auscultation bilaterally with good excursion.  No wheezes or rhonchi. HEART:  Regular rate and rhythm.  ABDOMEN:  Soft, thin, nontender.  Positive bowel sounds.  MSK:  No focal spinal tenderness to palpation. Good range of motion in the upper extremities.  no joint swelling.  EXTREMITIES:  No peripheral edema.   SKIN:  Skin on the hands is dry and erythematous, especially on the dorsal surfaces bilaterally. There is some mild cracking and peeling noted on the fingertips, especially on the tips of the thumbs.  Otherwise, skin is diffusely dry, but there no obvious rashes or skin changes otherwise today. NEURO:  Nonfocal. Well oriented.  Positive affect.   LAB RESULTS:  CMP     Component Value Date/Time    NA 140 07/02/2013 1340   K 3.9 07/02/2013 1340   CO2 24 07/02/2013 1340   GLUCOSE 99 07/02/2013 1340   BUN 13.2 07/02/2013 1340   CREATININE 0.7 07/02/2013 1340   CALCIUM 10.5* 07/02/2013 1340   PROT 6.2* 07/02/2013 1340   ALBUMIN 4.0 07/02/2013 1340   AST 26 07/02/2013 1340   ALT 34 07/02/2013 1340   ALKPHOS 50 07/02/2013 1340   BILITOT 0.41 07/02/2013 1340     Lab Results  Component Value Date   WBC 3.7* 07/09/2013   NEUTROABS 2.6 07/09/2013   HGB 10.3* 07/09/2013   HCT 30.1* 07/09/2013   MCV 98.1 07/09/2013   PLT 139* 07/09/2013      STUDIES:  Mr Breast Bilateral W Wo Contrast  06/05/2013   CLINICAL DATA:  Patient with right breast carcinoma being treated with neoadjuvant chemotherapy. Re-evaluate tumor size.  EXAM: MR BILATERAL BREAST WITHOUT AND WITH CONTRAST  TECHNIQUE: Multiplanar, multisequence MR images of both breasts were obtained prior to and following the intravenous administration of 11ml of MultiHance.  THREE-DIMENSIONAL MR IMAGE RENDERING ON INDEPENDENT WORKSTATION:  Three-dimensional MR images were rendered by post-processing of the original MR data on an independent workstation. The three-dimensional MR images were interpreted, and findings are reported in the following complete MRI report for this study.  COMPARISON:  Previous exams  FINDINGS: Breast composition: d. Extreme fibroglandular tissue  Background parenchymal enhancement: Moderate  Right breast: Very minimal residual punctate enhancement is demonstrated within the subareolar region of the right breast at the site of biopsied right breast malignancy. Scattered foci of enhancement within the right breast.  Left breast: No mass or abnormal enhancement.  Lymph nodes: No abnormal appearing lymph nodes.  Ancillary findings:  None.  IMPRESSION: Very minimal residual punctate enhancement is demonstrated at the site of previously biopsied right breast malignancy, compatible with treatment response.  RECOMMENDATION: Treatment  plan.  BI-RADS CATEGORY  6: Known biopsy-proven malignancy - appropriate action should be taken.   Electronically Signed   By: Annia Belt M.D.   On: 06/05/2013 16:42    ASSESSMENT: 57 y.o. [place] woman   (1)  status post right breast biopsy 03/19/2013 for a clinical T2 N0, stage IIA invasive ductal breast cancer, grade 3, triple negative, with  an MIB-1 of 54%.  (2) suspicious right axillary lymph node biopsy negative  (3) received four cycles of dose dense doxorubicin/ cyclophosphamide between 04/17/2013 and 05/29/2013 , at standard doses, with Neulasta support;  (4) on  06/12/2013 starting 12 weekly doses of carboplatin/ paclitaxel; will skip the Christmas week dose (add at the end)   (5) anticipate definitive surgery 09/30/2013 (Spring Break week)   PLAN: [name] will  receive her fifth weekly dose of carboplatin/paclitaxel today as planned. She will have a break next week for the Christmas holiday, and will return in 2 weeks, December 30, for labs and physical exam in anticipation of cycle 6.   She has been scheduled out through all 12 weekly doses, taking her into mid February. She will also need a repeat breast MRI in mid February, and we will get that scheduled for her at her next appointment here.  Janet [name] voices her understanding and agreement with the above plan and will call with any problems prior to her next scheduled appointment.    Miller Edgington, PA-C   07/09/2013 12:47 PM

## 2013-07-09 NOTE — Patient Instructions (Signed)
[facility] Discharge Instructions for Patients Receiving Chemotherapy  Today you received the following chemotherapy agents: Taxol and Carboplatin.   To help prevent nausea and vomiting after your treatment, we encourage you to take your nausea medication as directed.    If you develop nausea and vomiting that is not controlled by your nausea medication, call the clinic.   BELOW ARE SYMPTOMS THAT SHOULD BE REPORTED IMMEDIATELY:  *FEVER GREATER THAN 100.5 F  *CHILLS WITH OR WITHOUT FEVER  NAUSEA AND VOMITING THAT IS NOT CONTROLLED WITH YOUR NAUSEA MEDICATION  *UNUSUAL SHORTNESS OF BREATH  *UNUSUAL BRUISING OR BLEEDING  TENDERNESS IN MOUTH AND THROAT WITH OR WITHOUT PRESENCE OF ULCERS  *URINARY PROBLEMS  *BOWEL PROBLEMS  UNUSUAL RASH Items with * indicate a potential emergency and should be followed up as soon as possible.  Feel free to call the clinic you have any questions or concerns. The clinic phone number is [phone].    

## 2013-07-11 ENCOUNTER — Telehealth: Payer: Self-pay | Admitting: *Deleted

## 2013-07-11 NOTE — Telephone Encounter (Signed)
Faxed refill request received.  Note printed script for this medicine dated 07-09-2013.  Called patient who does have the script and has asked pharmacy for the refill.  Asked what she should do.  This nurse instructed her to take the script to [facility] for the refill.  [facility] [address] with note indicating patient will bring script for the refill.

## 2013-07-16 ENCOUNTER — Ambulatory Visit: Payer: BC Managed Care – PPO | Admitting: Physician Assistant

## 2013-07-16 ENCOUNTER — Ambulatory Visit: Payer: BC Managed Care – PPO

## 2013-07-16 ENCOUNTER — Other Ambulatory Visit: Payer: BC Managed Care – PPO | Admitting: Lab

## 2013-07-23 ENCOUNTER — Ambulatory Visit (HOSPITAL_BASED_OUTPATIENT_CLINIC_OR_DEPARTMENT_OTHER): Payer: BC Managed Care – PPO | Admitting: Physician Assistant

## 2013-07-23 ENCOUNTER — Encounter: Payer: Self-pay | Admitting: Physician Assistant

## 2013-07-23 ENCOUNTER — Ambulatory Visit (HOSPITAL_BASED_OUTPATIENT_CLINIC_OR_DEPARTMENT_OTHER): Payer: BC Managed Care – PPO

## 2013-07-23 ENCOUNTER — Other Ambulatory Visit (HOSPITAL_BASED_OUTPATIENT_CLINIC_OR_DEPARTMENT_OTHER): Payer: BC Managed Care – PPO

## 2013-07-23 VITALS — BP 109/70 | HR 73 | Temp 98.6°F | Resp 18 | Ht 69.0 in | Wt 120.9 lb

## 2013-07-23 DIAGNOSIS — Z5111 Encounter for antineoplastic chemotherapy: Secondary | ICD-10-CM

## 2013-07-23 DIAGNOSIS — C50419 Malignant neoplasm of upper-outer quadrant of unspecified female breast: Secondary | ICD-10-CM

## 2013-07-23 DIAGNOSIS — L02612 Cutaneous abscess of left foot: Secondary | ICD-10-CM

## 2013-07-23 DIAGNOSIS — C50919 Malignant neoplasm of unspecified site of unspecified female breast: Secondary | ICD-10-CM

## 2013-07-23 DIAGNOSIS — M79609 Pain in unspecified limb: Secondary | ICD-10-CM

## 2013-07-23 DIAGNOSIS — G43909 Migraine, unspecified, not intractable, without status migrainosus: Secondary | ICD-10-CM

## 2013-07-23 DIAGNOSIS — C50411 Malignant neoplasm of upper-outer quadrant of right female breast: Secondary | ICD-10-CM

## 2013-07-23 DIAGNOSIS — Z171 Estrogen receptor negative status [ER-]: Secondary | ICD-10-CM

## 2013-07-23 DIAGNOSIS — D649 Anemia, unspecified: Secondary | ICD-10-CM

## 2013-07-23 LAB — COMPREHENSIVE METABOLIC PANEL (CC13)
AST: 40 U/L — ABNORMAL HIGH (ref 5–34)
Albumin: 4.1 g/dL (ref 3.5–5.0)
Alkaline Phosphatase: 71 U/L (ref 40–150)
Calcium: 10 mg/dL (ref 8.4–10.4)
Chloride: 106 mEq/L (ref 98–109)
Creatinine: 0.7 mg/dL (ref 0.6–1.1)
Glucose: 81 mg/dl (ref 70–140)
Potassium: 4 mEq/L (ref 3.5–5.1)
Sodium: 139 mEq/L (ref 136–145)
Total Protein: 6.3 g/dL — ABNORMAL LOW (ref 6.4–8.3)

## 2013-07-23 LAB — CBC WITH DIFFERENTIAL/PLATELET
EOS%: 1 % (ref 0.0–7.0)
Eosinophils Absolute: 0 10*3/uL (ref 0.0–0.5)
HGB: 10.9 g/dL — ABNORMAL LOW (ref 11.6–15.9)
LYMPH%: 31.8 % (ref 14.0–49.7)
MCV: 102.6 fL — ABNORMAL HIGH (ref 79.5–101.0)
MONO#: 0.3 10*3/uL (ref 0.1–0.9)
MONO%: 11.8 % (ref 0.0–14.0)
NEUT#: 1.3 10*3/uL — ABNORMAL LOW (ref 1.5–6.5)
NEUT%: 54.5 % (ref 38.4–76.8)
RBC: 3.12 10*6/uL — ABNORMAL LOW (ref 3.70–5.45)
RDW: 15.9 % — ABNORMAL HIGH (ref 11.2–14.5)
lymph#: 0.8 10*3/uL — ABNORMAL LOW (ref 0.9–3.3)

## 2013-07-23 MED ORDER — DIPHENHYDRAMINE HCL 50 MG/ML IJ SOLN
INTRAMUSCULAR | Status: AC
  Filled 2013-07-23: qty 1

## 2013-07-23 MED ORDER — DEXAMETHASONE SODIUM PHOSPHATE 20 MG/5ML IJ SOLN
20.0000 mg | Freq: Once | INTRAMUSCULAR | Status: AC
  Administered 2013-07-23: 20 mg via INTRAVENOUS

## 2013-07-23 MED ORDER — ONDANSETRON 16 MG/50ML IVPB (CHCC)
INTRAVENOUS | Status: AC
  Filled 2013-07-23: qty 16

## 2013-07-23 MED ORDER — SODIUM CHLORIDE 0.9 % IV SOLN
207.6000 mg | Freq: Once | INTRAVENOUS | Status: AC
  Administered 2013-07-23: 210 mg via INTRAVENOUS
  Filled 2013-07-23: qty 21

## 2013-07-23 MED ORDER — SODIUM CHLORIDE 0.9 % IV SOLN
80.0000 mg/m2 | Freq: Once | INTRAVENOUS | Status: AC
  Administered 2013-07-23: 132 mg via INTRAVENOUS
  Filled 2013-07-23: qty 22

## 2013-07-23 MED ORDER — DIPHENHYDRAMINE HCL 50 MG/ML IJ SOLN
25.0000 mg | Freq: Once | INTRAMUSCULAR | Status: AC
  Administered 2013-07-23: 25 mg via INTRAVENOUS

## 2013-07-23 MED ORDER — ONDANSETRON 16 MG/50ML IVPB (CHCC)
16.0000 mg | Freq: Once | INTRAVENOUS | Status: AC
  Administered 2013-07-23: 16 mg via INTRAVENOUS

## 2013-07-23 MED ORDER — FAMOTIDINE IN NACL 20-0.9 MG/50ML-% IV SOLN
INTRAVENOUS | Status: AC
  Filled 2013-07-23: qty 50

## 2013-07-23 MED ORDER — DOXYCYCLINE HYCLATE 100 MG PO TABS: ORAL_TABLET | ORAL | Status: DC

## 2013-07-23 MED ORDER — SODIUM CHLORIDE 0.9 % IV SOLN
Freq: Once | INTRAVENOUS | Status: AC
  Administered 2013-07-23: 13:00:00 via INTRAVENOUS

## 2013-07-23 MED ORDER — FAMOTIDINE IN NACL 20-0.9 MG/50ML-% IV SOLN
20.0000 mg | Freq: Once | INTRAVENOUS | Status: AC
  Administered 2013-07-23: 20 mg via INTRAVENOUS

## 2013-07-23 MED ORDER — SODIUM CHLORIDE 0.9 % IJ SOLN
10.0000 mL | INTRAMUSCULAR | Status: DC | PRN
  Administered 2013-07-23: 10 mL
  Filled 2013-07-23: qty 10

## 2013-07-23 MED ORDER — DEXAMETHASONE SODIUM PHOSPHATE 20 MG/5ML IJ SOLN
INTRAMUSCULAR | Status: AC
  Filled 2013-07-23: qty 5

## 2013-07-23 MED ORDER — HEPARIN SOD (PORK) LOCK FLUSH 100 UNIT/ML IV SOLN
500.0000 [IU] | Freq: Once | INTRAVENOUS | Status: AC | PRN
  Administered 2013-07-23: 500 [IU]
  Filled 2013-07-23: qty 5

## 2013-07-23 NOTE — Patient Instructions (Signed)
[facility] Discharge Instructions for Patients Receiving Chemotherapy  Today you received the following chemotherapy agents Taxol/Carbo  To help prevent nausea and vomiting after your treatment, we encourage you to take your nausea medication as directed   If you develop nausea and vomiting that is not controlled by your nausea medication, call the clinic.   BELOW ARE SYMPTOMS THAT SHOULD BE REPORTED IMMEDIATELY:  *FEVER GREATER THAN 100.5 F  *CHILLS WITH OR WITHOUT FEVER  NAUSEA AND VOMITING THAT IS NOT CONTROLLED WITH YOUR NAUSEA MEDICATION  *UNUSUAL SHORTNESS OF BREATH  *UNUSUAL BRUISING OR BLEEDING  TENDERNESS IN MOUTH AND THROAT WITH OR WITHOUT PRESENCE OF ULCERS  *URINARY PROBLEMS  *BOWEL PROBLEMS  UNUSUAL RASH Items with * indicate a potential emergency and should be followed up as soon as possible.  Feel free to call the clinic you have any questions or concerns. The clinic phone number is [phone].

## 2013-07-23 NOTE — Progress Notes (Signed)
Patient ID: Janet [name], female   DOB: 08/02/1955, 57 y.o.   MRN: [id number] ID: [name] OB: [birth year]  MR#: [id number]  NWG#:[id number]  PCP: Astrid Divine, MD GYN:   Janeece RiggersCicero Duck; Ovidio Kin OTHER MD: Chipper Herb, Maryelizabeth Kaufmann  CHIEF COMPLAINT:  Right Breast Cancer/Neoadjuvant Chemo   HISTORY OF PRESENT ILLNESS: "Janet [name]" herself noted a lump in her right breast on 03/17/2013, and immediately contacted her physician so that on 03/18/2013 bilateral diagnostic mammography and right breast ultrasonography at the breast Center showed a developing mass in the subareolar region of the right breast. There were no malignant type calcifications. This mass was palpable in the upper outer quadrant, and by ultrasound measured 2.6 cm. Ultrasonography of the right axilla showed a lymph node measuring 1.1 cm, with a thickened cortex. The left breast was unremarkable.  Biopsy of the right breast mass 08/26//2014 showed ([name]) and invasive ductal carcinoma, grade 3, triple negative, with an MIB-1 of 54%. Biopsy of the right axillary lymph node in question at the same time was negative.  Bilateral breast MRI 03/26/2013 measured the right subareolar mass at 3.8 cm. The previously biopsied benign right axillary lymph node was again noted. There were no other suspicious areas in the right breast or axilla and the left breast was unremarkable.  Her subsequent history is as detailed below  INTERVAL HISTORY: [name] returns alone today for followup of her locally advanced right breast cancer. She receives weekly carboplatin/paclitaxel and is due for her 6th of 12 planned weekly doses today, with treatment having been held one week ago due to the Christmas holiday.  Janet [name] had a great holiday with her family, and her daughters were able to be here for a visit. She herself is feeling well, with a good energy level, and has few new complaints today.  She has noted some "blood in her  eye", and apparent conjunctiva hemorrhage which happens spontaneously earlier this week. She denies any pain whatsoever, feels no pressure in the eye, and has had no change in vision. She tells me this has happened remotely one other time in the past.  Her platelet count is normal today. She continues to have some blood in the mucus when she blows her nose but this is not new, and has not worsened.  Otherwise, her biggest complaint today is some pain and discomfort affecting the left great toenail.   The nail itself is hyperpigmented and has loosened slightly from the nailbed.  She complains of pain around the nail bed itself, but denies any recent signs of drainage. Her skin is still very dry, but otherwise she's had no additional skin changes elsewhere. The other nails, thus far, seem to be unaffected.   REVIEW OF SYSTEMS: [name] has had no fevers, chills, or night sweats.  She's had no mouth ulcers or oral sensitivity and denies any problems swallowing.  Her appetite is still good, and she denies any  problems with nausea or emesis and has had no  change in bowel or bladder habits.  She has no cough, and has had no increased shortness of breath, chest pain, or palpitations.  She  has a history of migraine headaches, and these are stable. She uses Imitrex affectively when needed.    She's had no dizziness or change in vision.   She also denies any new or unusual myalgias, arthralgias, or bony pain, and has had no peripheral swelling.  She denies any signs of numbness or tingling in either  the upper or lower extremities.  Otherwise a detailed review of systems today was noncontributory  PAST MEDICAL HISTORY: Past Medical History  Diagnosis Date  . Migraines   . Cancer     breast  . Breast cancer   . Allergy   . Anemia, unspecified 07/09/2013    PAST SURGICAL HISTORY: Past Surgical History  Procedure Laterality Date  . Removal of cataracts    . Foot surgery  age 28    bone graft  .  Portacath placement N/A 04/04/2013    Procedure: INSERTION PORT-A-CATH;  Surgeon: Currie Paris, MD;  Location: [facility];  Service: General;  Laterality: N/A;    FAMILY HISTORY Family History  Problem Relation Age of Onset  . Pancreatic cancer Mother   . Breast cancer Maternal Grandmother    the patient's father died at the age of 84 with pancreatic cancer. (He was Dr. Chrystine [name]). [name] is alive at age 5. Janet [name] has one brother, no sisters. [name]'s mother died from breast cancer at the age of 6. There is no history of ovarian cancer in the family  GYNECOLOGIC HISTORY:  Menarche age 48, first live birth age 68. She is GX P2. She stopped having periods approximately 2004. She did not take hormone replacement. She did take birth control pills remotely for 5-10 years, with no complications.  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 Her husband the [employer]. ARoxan Hockey "Robby" Bulow, is a local judge. The patient's 2 children are Janet [name] who lives in [place] and works as a Financial risk analyst, and Janet [name], who works in [place] and is an associate with a Set designer group.  ADVANCED DIRECTIVES: In place   HEALTH MAINTENANCE: (Updated 06/11/2013) History  Substance Use Topics  . Smoking status: Never Smoker   . Smokeless tobacco: Never Used  . Alcohol Use: Yes     Comment: occasional     Colonoscopy: Not on file  PAP:  Oct 2014, Dr. Valentina Lucks  Bone density: July 2013 at [facility],    osteopenia  Lipid panel: UTD, Dr. Valentina Lucks   Allergies  Allergen Reactions  . Penicillins   . Azithromycin Rash    Current Outpatient Prescriptions  Medication Sig Dispense Refill  . acetaminophen (TYLENOL) 325 MG tablet Take 650 mg  by mouth every 6 (six) hours as needed for pain.      Marland Kitchen lidocaine-prilocaine (EMLA) cream Apply topically as needed.  30 g  0  . LORazepam (ATIVAN) 0.5 MG tablet Take 1 tablet (0.5 mg total) by mouth at bedtime as needed for anxiety or sleep (or nausea).  30 tablet  0  . Multiple Vitamin (MULTI-VITAMIN DAILY PO) Take by mouth daily.      . SUMAtriptan (IMITREX) 50 MG tablet Take 1 tablet (50 mg total) by mouth every 2 (two) hours as needed for migraine. May repeat in 2 hours if headache persists or recurs.  10 tablet  0  . Alum & Mag Hydroxide-Simeth (MAGIC MOUTHWASH W/LIDOCAINE) SOLN Take 5 mLs by mouth 4 (four) times daily as needed. For throat or mouth pain  240 mL  1  . CALCIUM PO Take by mouth daily.      Marland Kitchen doxycycline (VIBRA-TABS) 100 MG tablet  1 tab by mouth twice daily x 1 day;  then 1 tab by mouth daily until completed (total of 10 days)  11 tablet  0  . polyethylene glycol powder (MIRALAX) powder Take 17 g by mouth daily.  255 g  0  . prochlorperazine (COMPAZINE) 10 MG tablet Take 1 tablet (10 mg total) by mouth every 6 (six) hours as needed (Nausea or vomiting).  30 tablet  1   No current facility-administered medications for this visit.    OBJECTIVE: Middle-aged white woman in no acute distress  Filed Vitals:   07/23/13 1150  BP: 109/70  Pulse: 73  Temp: 98.6 F (37 C)  Resp: 18     Body mass index is 17.85 kg/(m^2).    ECOG FS:0 Filed Weights   07/23/13 1150  Weight: 120 lb 14.4 oz (54.84 kg)   Physical Exam: HEENT:  Sclerae anicteric. There is some redness consistent with a conjunctival hemorrhage in the inner portion of the left eye.  PERRLA.  Oropharynx clear with no ulcerations or oropharyngeal  candidiasis.  nasal mucosa is benign, pink, with no evidence of bleeding.  Neck is supple  NODES:  No cervical or supraclavicular lymphadenopathy palpated.  BREAST EXAM:  There is some palpable thickening in the upper outer quadrant of the right breast, just lateral to the   areolar c unable to palpate a distinct, measurable mass today. No skin changes are noted. Left breast is unremarkable. omplex.    Axillae are benign bilaterally, with no palpable lymphadenopathy. LUNGS:  Clear to auscultation bilaterally with good excursion.  No wheezes or rhonchi. HEART:  Regular rate and rhythm.  or murmur.  ABDOMEN:  Soft, thin, nontender.  Positive bowel sounds.  MSK:  No focal spinal tenderness to palpation. Good range of motion in the upper extremities.  No joint swelling.  EXTREMITIES:  No peripheral edema.   SKIN:  Skin is diffusely dry, but with no visible rashes today. The cracking and peeling on the fingertips has improved. The toe nail on the left great toe is hyperpigmented and thickened, slightly tender to touch. The skin around the nail itself is also tender to touch, and is erythematous and warm, but with no obvious signs of drainage at the current time. Is also no visible ecchymoses or petechiae noted.   The port is intact in the left upper chest wall with no erythema, edema, or evidence of infection/cellulitis. NEURO:  Nonfocal. Well oriented.  Positive affect.   LAB RESULTS:  CMP     Component Value Date/Time   NA 139 07/23/2013 1127   K 4.0 07/23/2013 1127   CO2 24 07/23/2013 1127   GLUCOSE 81 07/23/2013 1127   BUN 12.2 07/23/2013 1127   CREATININE 0.7 07/23/2013 1127   CALCIUM 10.0 07/23/2013 1127   PROT 6.3* 07/23/2013 1127   ALBUMIN 4.1 07/23/2013 1127   AST 40* 07/23/2013 1127   ALT 56* 07/23/2013 1127   ALKPHOS 71 07/23/2013 1127   BILITOT 0.48 07/23/2013 1127     Lab Results  Component Value Date   WBC 2.4* 07/23/2013   NEUTROABS 1.3* 07/23/2013   HGB 10.9* 07/23/2013   HCT 32.0* 07/23/2013   MCV 102.6* 07/23/2013   PLT 157 07/23/2013      STUDIES:  Mr Breast Bilateral W Wo Contrast  06/05/2013   CLINICAL DATA:  Patient with right breast carcinoma being treated with neoadjuvant chemotherapy. Re-evaluate tumor size.  EXAM: MR  BILATERAL BREAST WITHOUT AND WITH CONTRAST  TECHNIQUE: Multiplanar,  multisequence MR images of both breasts were obtained prior to and following the intravenous administration of 11ml of MultiHance.  THREE-DIMENSIONAL MR IMAGE RENDERING ON INDEPENDENT WORKSTATION:  Three-dimensional MR images were rendered by post-processing of the original MR data on an independent workstation. The three-dimensional MR images were interpreted, and findings are reported in the following complete MRI report for this study.  COMPARISON:  Previous exams  FINDINGS: Breast composition: d. Extreme fibroglandular tissue  Background parenchymal enhancement: Moderate  Right breast: Very minimal residual punctate enhancement is demonstrated within the subareolar region of the right breast at the site of biopsied right breast malignancy. Scattered foci of enhancement within the right breast.  Left breast: No mass or abnormal enhancement.  Lymph nodes: No abnormal appearing lymph nodes.  Ancillary findings:  None.  IMPRESSION: Very minimal residual punctate enhancement is demonstrated at the site of previously biopsied right breast malignancy, compatible with treatment response.  RECOMMENDATION: Treatment plan.  BI-RADS CATEGORY  6: Known biopsy-proven malignancy - appropriate action should be taken.   Electronically Signed   By: Annia Belt M.D.   On: 06/05/2013 16:42    ASSESSMENT: 57 y.o. [place] woman   (1)  status post right breast biopsy 03/19/2013 for a clinical T2 N0, stage IIA invasive ductal breast cancer, grade 3, triple negative, with an MIB-1 of 54%.  (2) suspicious right axillary lymph node biopsy negative  (3) received four cycles of dose dense doxorubicin/ cyclophosphamide between 04/17/2013 and 05/29/2013 , at standard doses, with Neulasta support;  (4) on  06/12/2013 starting 12 weekly doses of carboplatin/ paclitaxel.  (A treatment break was given on 07/16/2013 due to the Christmas holiday, with the 12th cycle  to be added on at the end.)  (5) anticipates definitive surgery 09/30/2013 (Spring Break week)   PLAN: [name] will  receive her 6th weekly dose of carboplatin/paclitaxel today as planned.  overall, she is tolerating treatment well, and we're making no changes to her current regimen.   I will note that Janet [name] tends to have a slightly reduced ANC which is 1.3 today. We will continue to treat as long as her ANC is equal to or greater than 1.0. I have spoken with our charge nurse today with regards to these guidelines.  I have asked Janet [name] to soak her feet in warm water and either peroxide or Epsom salts twice daily, and dry thoroughly underneath the nails. I am also starting her on doxycycline, 100 mg daily for the next 10 days, for an early cellulitis around the left great toenail. I have asked her to let us know if this area worsens, begins to drain, or becomes more painful. If a paronychia develops, we will need to refer her to either her primary care physician, an urgent care or a dermatologist for likely I&D.  Hopefully, however, beginning the soaks and the antibiotic early will prevent that from occurring.    As noted above, her platelets are normal in stable, and I think the conjunctiva hemorrhage is just a benign process. I have asked her to contact her ophthalmologist (Dr. Maryelizabeth Kaufmann) if she notices any pain or pressure in the eye or if she has any changes in vision.  I will see [name] again next week on January 6 for her seventh weekly dose of chemotherapy. At that time we will also discuss scheduling her final breast MRI which will be approximately February 17 after completing neoadjuvant therapy. She anticipates having her surgery in early to mid March during the time  of her "Spring break" from [place].   Janet [name] voices her understanding and agreement with the above plan and will call with any problems prior to her next scheduled appointment.    Giankarlo Leamer, PA-C   07/23/2013 12:54  PM

## 2013-07-25 DIAGNOSIS — Z1501 Genetic susceptibility to malignant neoplasm of breast: Secondary | ICD-10-CM

## 2013-07-25 DIAGNOSIS — Z1509 Genetic susceptibility to other malignant neoplasm: Secondary | ICD-10-CM

## 2013-07-25 HISTORY — DX: Genetic susceptibility to malignant neoplasm of breast: Z15.01

## 2013-07-25 HISTORY — DX: Genetic susceptibility to other malignant neoplasm: Z15.09

## 2013-07-30 ENCOUNTER — Encounter: Payer: Self-pay | Admitting: Physician Assistant

## 2013-07-30 ENCOUNTER — Other Ambulatory Visit (HOSPITAL_BASED_OUTPATIENT_CLINIC_OR_DEPARTMENT_OTHER): Payer: BC Managed Care – PPO

## 2013-07-30 ENCOUNTER — Telehealth: Payer: Self-pay | Admitting: Oncology

## 2013-07-30 ENCOUNTER — Ambulatory Visit (HOSPITAL_BASED_OUTPATIENT_CLINIC_OR_DEPARTMENT_OTHER): Payer: BC Managed Care – PPO | Admitting: Physician Assistant

## 2013-07-30 ENCOUNTER — Ambulatory Visit (HOSPITAL_BASED_OUTPATIENT_CLINIC_OR_DEPARTMENT_OTHER): Payer: BC Managed Care – PPO

## 2013-07-30 VITALS — BP 115/73 | HR 66 | Temp 98.5°F | Resp 18 | Ht 69.0 in | Wt 119.7 lb

## 2013-07-30 DIAGNOSIS — C50411 Malignant neoplasm of upper-outer quadrant of right female breast: Secondary | ICD-10-CM

## 2013-07-30 DIAGNOSIS — Z171 Estrogen receptor negative status [ER-]: Secondary | ICD-10-CM

## 2013-07-30 DIAGNOSIS — D709 Neutropenia, unspecified: Secondary | ICD-10-CM | POA: Insufficient documentation

## 2013-07-30 DIAGNOSIS — C50019 Malignant neoplasm of nipple and areola, unspecified female breast: Secondary | ICD-10-CM

## 2013-07-30 DIAGNOSIS — C50919 Malignant neoplasm of unspecified site of unspecified female breast: Secondary | ICD-10-CM

## 2013-07-30 DIAGNOSIS — Z5111 Encounter for antineoplastic chemotherapy: Secondary | ICD-10-CM

## 2013-07-30 LAB — CBC WITH DIFFERENTIAL/PLATELET
BASO%: 1.1 % (ref 0.0–2.0)
Basophils Absolute: 0 10*3/uL (ref 0.0–0.1)
EOS%: 1.4 % (ref 0.0–7.0)
Eosinophils Absolute: 0 10*3/uL (ref 0.0–0.5)
HEMATOCRIT: 35.4 % (ref 34.8–46.6)
HGB: 12.1 g/dL (ref 11.6–15.9)
LYMPH%: 37.5 % (ref 14.0–49.7)
MCH: 35.3 pg — ABNORMAL HIGH (ref 25.1–34.0)
MCHC: 34.3 g/dL (ref 31.5–36.0)
MCV: 102.8 fL — ABNORMAL HIGH (ref 79.5–101.0)
MONO#: 0.2 10*3/uL (ref 0.1–0.9)
MONO%: 7.8 % (ref 0.0–14.0)
NEUT#: 1.2 10*3/uL — ABNORMAL LOW (ref 1.5–6.5)
NEUT%: 52.2 % (ref 38.4–76.8)
PLATELETS: 169 10*3/uL (ref 145–400)
RBC: 3.44 10*6/uL — ABNORMAL LOW (ref 3.70–5.45)
RDW: 14 % (ref 11.2–14.5)
WBC: 2.2 10*3/uL — ABNORMAL LOW (ref 3.9–10.3)
lymph#: 0.8 10*3/uL — ABNORMAL LOW (ref 0.9–3.3)

## 2013-07-30 MED ORDER — SODIUM CHLORIDE 0.9 % IV SOLN
207.6000 mg | Freq: Once | INTRAVENOUS | Status: AC
  Administered 2013-07-30: 210 mg via INTRAVENOUS
  Filled 2013-07-30: qty 21

## 2013-07-30 MED ORDER — FAMOTIDINE IN NACL 20-0.9 MG/50ML-% IV SOLN
20.0000 mg | Freq: Once | INTRAVENOUS | Status: AC
  Administered 2013-07-30: 20 mg via INTRAVENOUS

## 2013-07-30 MED ORDER — ONDANSETRON 16 MG/50ML IVPB (CHCC)
16.0000 mg | Freq: Once | INTRAVENOUS | Status: AC
  Administered 2013-07-30: 16 mg via INTRAVENOUS

## 2013-07-30 MED ORDER — SODIUM CHLORIDE 0.9 % IV SOLN
80.0000 mg/m2 | Freq: Once | INTRAVENOUS | Status: AC
  Administered 2013-07-30: 132 mg via INTRAVENOUS
  Filled 2013-07-30: qty 22

## 2013-07-30 MED ORDER — SODIUM CHLORIDE 0.9 % IV SOLN
Freq: Once | INTRAVENOUS | Status: AC
  Administered 2013-07-30: 14:00:00 via INTRAVENOUS

## 2013-07-30 MED ORDER — HEPARIN SOD (PORK) LOCK FLUSH 100 UNIT/ML IV SOLN
500.0000 [IU] | Freq: Once | INTRAVENOUS | Status: AC | PRN
  Administered 2013-07-30: 500 [IU]
  Filled 2013-07-30: qty 5

## 2013-07-30 MED ORDER — SODIUM CHLORIDE 0.9 % IJ SOLN
10.0000 mL | INTRAMUSCULAR | Status: DC | PRN
  Administered 2013-07-30: 10 mL
  Filled 2013-07-30: qty 10

## 2013-07-30 MED ORDER — ONDANSETRON 16 MG/50ML IVPB (CHCC)
INTRAVENOUS | Status: AC
  Filled 2013-07-30: qty 16

## 2013-07-30 MED ORDER — FAMOTIDINE IN NACL 20-0.9 MG/50ML-% IV SOLN
INTRAVENOUS | Status: AC
  Filled 2013-07-30: qty 50

## 2013-07-30 MED ORDER — DEXAMETHASONE SODIUM PHOSPHATE 20 MG/5ML IJ SOLN
INTRAMUSCULAR | Status: AC
  Filled 2013-07-30: qty 5

## 2013-07-30 MED ORDER — DIPHENHYDRAMINE HCL 50 MG/ML IJ SOLN
INTRAMUSCULAR | Status: AC
  Filled 2013-07-30: qty 1

## 2013-07-30 MED ORDER — DEXAMETHASONE SODIUM PHOSPHATE 20 MG/5ML IJ SOLN
20.0000 mg | Freq: Once | INTRAMUSCULAR | Status: AC
  Administered 2013-07-30: 20 mg via INTRAVENOUS

## 2013-07-30 MED ORDER — DIPHENHYDRAMINE HCL 50 MG/ML IJ SOLN
25.0000 mg | Freq: Once | INTRAMUSCULAR | Status: AC
  Administered 2013-07-30: 25 mg via INTRAVENOUS

## 2013-07-30 NOTE — Patient Instructions (Signed)
[facility] Discharge Instructions for Patients Receiving Chemotherapy  Today you received the following chemotherapy agents: Taxol, Carboplatin  To help prevent nausea and vomiting after your treatment, we encourage you to take your nausea medication as prescribed.    If you develop nausea and vomiting that is not controlled by your nausea medication, call the clinic.   BELOW ARE SYMPTOMS THAT SHOULD BE REPORTED IMMEDIATELY:  *FEVER GREATER THAN 100.5 F  *CHILLS WITH OR WITHOUT FEVER  NAUSEA AND VOMITING THAT IS NOT CONTROLLED WITH YOUR NAUSEA MEDICATION  *UNUSUAL SHORTNESS OF BREATH  *UNUSUAL BRUISING OR BLEEDING  TENDERNESS IN MOUTH AND THROAT WITH OR WITHOUT PRESENCE OF ULCERS  *URINARY PROBLEMS  *BOWEL PROBLEMS  UNUSUAL RASH Items with * indicate a potential emergency and should be followed up as soon as possible.  Feel free to call the clinic you have any questions or concerns. The clinic phone number is [phone].    

## 2013-07-30 NOTE — Telephone Encounter (Signed)
, °

## 2013-07-30 NOTE — Progress Notes (Signed)
Okay to treat today with ANC 1.2 per Micah Flesher. Cindi Carbon, RN

## 2013-07-30 NOTE — Progress Notes (Signed)
Patient ID: Janet [name], female   DOB: May 15, 1956, 58 y.o.   MRN: [id number] ID: Janet [name] OB: 07-18-1956  MR#: [id number]  IWL#:[id number]  PCP: Janet Casco, MD GYN:   Janet [name] OTHER MD: Janet [name], Janet [name]  CHIEF COMPLAINT:  Right Breast Cancer/Neoadjuvant Chemo   HISTORY OF PRESENT ILLNESS: "Janet [name]" herself noted a lump in her right breast on 03/17/2013, and immediately contacted her physician so that on 03/18/2013 bilateral diagnostic mammography and right breast ultrasonography at the breast Center showed a developing mass in the subareolar region of the right breast. There were no malignant type calcifications. This mass was palpable in the upper outer quadrant, and by ultrasound measured 2.6 cm. Ultrasonography of the right axilla showed a lymph node measuring 1.1 cm, with a thickened cortex. The left breast was unremarkable.  Biopsy of the right breast mass 08/26//2014 showed ([name]) and invasive ductal carcinoma, grade 3, triple negative, with an MIB-1 of 54%. Biopsy of the right axillary lymph node in question at the same time was negative.  Bilateral breast MRI 03/26/2013 measured the right subareolar mass at 3.8 cm. The previously biopsied benign right axillary lymph node was again noted. There were no other suspicious areas in the right breast or axilla and the left breast was unremarkable.  Her subsequent history is as detailed below  INTERVAL HISTORY: Janet [name] returns alone today for followup of her locally advanced right breast cancer. She receives weekly carboplatin/paclitaxel and is due for her 7th of 12 planned weekly doses today.  She's tolerating treatment extremely well, and continues with her busy schedule. 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 She continues to exercise/run on a daily basis.   When seen here one week ago, she was having some pain in the left great toenail. She had  signs of an early cellulitis and was started on doxycycline, 100 mg daily for 10 days. She's been soaking her feet in warm water and Epson salt at least twice daily since her appointment last week. The toe is still slightly tender, but has improved significantly. It is still very dark, but has not loosened from the nailbed.  Janet [name] has been some interesting information from some distant relatives. Apparently, direct descendants of her paternal great-grandfather are known to be positive for the BRCA2 gene. She does not have a lot of information, but has contacted a distant cousin who is sending her all the details.  Janet [name] would like to meet with our genetics counselor for additional consultation in light of this new information.   REVIEW OF SYSTEMS: Janet [name] has had no recent illnesses and denies fevers, chills, or night sweats.  She's had no mouth ulcers or oral sensitivity and denies any problems swallowing.  Her energy level is good, as is her appetite.  She denies any  problems with nausea or emesis and has had no  change in bowel or bladder habits.  She has no cough, and has had no increased shortness of breath, chest pain, or palpitations.  She  has a history of migraine headaches, and these are stable. She uses Imitrex affectively when needed.    She's had no dizziness or change in vision.  The previously noted conjunctiva hemorrhage has completely resolved in the left eye. She's had no additional signs of abnormal bruising or bleeding. She also denies any new or unusual myalgias, arthralgias, or bony pain, and has had no peripheral swelling.  She denies any signs  of numbness or tingling in either the upper or lower extremities.  Otherwise a detailed review of systems today was noncontributory  PAST MEDICAL HISTORY: Past Medical History  Diagnosis Date  . Migraines   . Cancer     breast  . Breast cancer   . Allergy   . Anemia, unspecified 07/09/2013    PAST SURGICAL HISTORY: Past Surgical  History  Procedure Laterality Date  . Removal of cataracts    . Foot surgery  age 4    bone graft  . Portacath placement N/A 04/04/2013    Procedure: INSERTION PORT-A-CATH;  Surgeon: Janet Lasso, MD;  Location: [facility];  Service: General;  Laterality: N/A;    FAMILY HISTORY Family History  Problem Relation Age of Onset  . Pancreatic cancer Mother   . Breast cancer Maternal Grandmother    the patient's father died at the age of 76 with pancreatic cancer. (He was Dr. Barbee [name]). [name] is alive at age 57. Janet [name] has one brother, no sisters. [name]'s mother died from breast cancer at the age of 32. There is no history of ovarian cancer in the family  GYNECOLOGIC HISTORY:  Menarche age 25, first live birth age 63. She is GX P2. She stopped having periods approximately 2004. She did not take hormone replacement. She did take birth control pills remotely for 5-10 years, with no complications.  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 Her husband the Janet [name]. AQuentin [name] "Janet" [name], is a local judge. The patient's 2 children are Janet [name] who lives in [place] and works as a Public affairs consultant, and Janet [name], who works in [place] and is an associate with a Interior and spatial designer group.  ADVANCED DIRECTIVES: In place   HEALTH MAINTENANCE: (Updated 06/11/2013) History  Substance Use Topics  . Smoking status: Never Smoker   . Smokeless tobacco: Never Used  . Alcohol Use: Yes     Comment: occasional     Colonoscopy: Not on file  PAP:  Oct 2014, Dr. Laurann [name]  Bone density: July 2013 at [facility],    osteopenia  Lipid panel: UTD, Dr. Laurann [name]   Allergies  Allergen Reactions  . Penicillins   . Azithromycin Rash    Current  Outpatient Prescriptions  Medication Sig Dispense Refill  . acetaminophen (TYLENOL) 325 MG tablet Take 650 mg by mouth every 6 (six) hours as needed for pain.      Marland Kitchen Alum & Mag Hydroxide-Simeth (MAGIC MOUTHWASH W/LIDOCAINE) SOLN Take 5 mLs by mouth 4 (four) times daily as needed. For throat or mouth pain  240 mL  1  . CALCIUM PO Take by mouth daily.      Marland Kitchen doxycycline (VIBRA-TABS) 100 MG tablet 1 tab by mouth twice daily x 1 day;  then 1 tab by mouth daily until completed (total of 10 days)  11 tablet  0  . lidocaine-prilocaine (EMLA) cream Apply topically as needed.  30 g  0  . LORazepam (ATIVAN) 0.5 MG tablet Take 1 tablet (0.5 mg total) by mouth at bedtime as needed for anxiety or sleep (or nausea).  30 tablet  0  . Multiple Vitamin (MULTI-VITAMIN DAILY PO) Take by mouth daily.      . polyethylene glycol powder (  MIRALAX) powder Take 17 g by mouth daily.  255 g  0  . prochlorperazine (COMPAZINE) 10 MG tablet Take 1 tablet (10 mg total) by mouth every 6 (six) hours as needed (Nausea or vomiting).  30 tablet  1  . SUMAtriptan (IMITREX) 50 MG tablet Take 1 tablet (50 mg total) by mouth every 2 (two) hours as needed for migraine. May repeat in 2 hours if headache persists or recurs.  10 tablet  0   No current facility-administered medications for this visit.    OBJECTIVE: Middle-aged white woman in no acute distress  Filed Vitals:   07/30/13 1156  BP: 115/73  Pulse: 66  Temp: 98.5 F (36.9 C)  Resp: 18     Body mass index is 17.67 kg/(m^2).    ECOG FS:0 Filed Weights   07/30/13 1156  Weight: 119 lb 11.2 oz (54.296 kg)   Physical Exam: HEENT:  Sclerae anicteric. Conjunctiva pink.  Oropharynx clear with no ulcerations or oropharyngeal  candidiasis.   Neck is supple  NODES:  No cervical or supraclavicular lymphadenopathy palpated.  BREAST EXAM: Deferred.    Axillae are benign bilaterally, with no palpable lymphadenopathy. LUNGS:  Clear to auscultation bilaterally with good excursion.  No  wheezes or rhonchi. HEART:  Regular rate and rhythm.  No murmur appreciated.  ABDOMEN:  Soft, thin, nontender.  Positive bowel sounds.  MSK:  No focal spinal tenderness to palpation. Good range of motion in the upper extremities.  No joint swelling.  EXTREMITIES:  No peripheral edema.   SKIN:  Skin is diffusely dry, but otherwise clear with no visible rashes. The toe nail of the left great toe is very dark and thickened, but is no longer tender to touch. The skin around the nail itself is only slightly erythematous, much improved as compared to last week. There is no drainage, bleeding, or evidence of infection The port is intact in the left upper chest wall with no erythema, edema, or evidence of infection/cellulitis. NEURO:  Nonfocal. Well oriented.  Positive affect.   LAB RESULTS:  CMP     Component Value Date/Time   NA 139 07/23/2013 1127   K 4.0 07/23/2013 1127   CO2 24 07/23/2013 1127   GLUCOSE 81 07/23/2013 1127   BUN 12.2 07/23/2013 1127   CREATININE 0.7 07/23/2013 1127   CALCIUM 10.0 07/23/2013 1127   PROT 6.3* 07/23/2013 1127   ALBUMIN 4.1 07/23/2013 1127   AST 40* 07/23/2013 1127   ALT 56* 07/23/2013 1127   ALKPHOS 71 07/23/2013 1127   BILITOT 0.48 07/23/2013 1127     Lab Results  Component Value Date   WBC 2.2* 07/30/2013   NEUTROABS 1.2* 07/30/2013   HGB 12.1 07/30/2013   HCT 35.4 07/30/2013   MCV 102.8* 07/30/2013   PLT 169 07/30/2013      STUDIES:  Mr Breast Bilateral W Wo Contrast  06/05/2013   CLINICAL DATA:  Patient with right breast carcinoma being treated with neoadjuvant chemotherapy. Re-evaluate tumor size.  EXAM: MR BILATERAL BREAST WITHOUT AND WITH CONTRAST  TECHNIQUE: Multiplanar, multisequence MR images of both breasts were obtained prior to and following the intravenous administration of 21m of MultiHance.  THREE-DIMENSIONAL MR IMAGE RENDERING ON INDEPENDENT WORKSTATION:  Three-dimensional MR images were rendered by post-processing of the original MR  data on an independent workstation. The three-dimensional MR images were interpreted, and findings are reported in the following complete MRI report for this study.  COMPARISON:  Previous exams  FINDINGS: Breast composition: d. Extreme  fibroglandular tissue  Background parenchymal enhancement: Moderate  Right breast: Very minimal residual punctate enhancement is demonstrated within the subareolar region of the right breast at the site of biopsied right breast malignancy. Scattered foci of enhancement within the right breast.  Left breast: No mass or abnormal enhancement.  Lymph nodes: No abnormal appearing lymph nodes.  Ancillary findings:  None.  IMPRESSION: Very minimal residual punctate enhancement is demonstrated at the site of previously biopsied right breast malignancy, compatible with treatment response.  RECOMMENDATION: Treatment plan.  BI-RADS CATEGORY  6: Known biopsy-proven malignancy - appropriate action should be taken.   Electronically Signed   By: Lovey Newcomer M.D.   On: 06/05/2013 16:42    ASSESSMENT: 59 y.o. [place] woman   (1)  status post right breast biopsy 03/19/2013 for a clinical T2 N0, stage IIA invasive ductal breast cancer, grade 3, triple negative, with an MIB-1 of 54%.  (2) suspicious right axillary lymph node biopsy negative  (3) received four cycles of dose dense doxorubicin/ cyclophosphamide between 04/17/2013 and 05/29/2013 , at standard doses, with Neulasta support;  (4) on  06/12/2013 starting 12 weekly doses of carboplatin/ paclitaxel.  (A treatment break was given on 07/16/2013 due to the Christmas holiday, with the 12th cycle to be added on at the end.)  (5) anticipates definitive surgery 09/30/2013 (Spring Break week)   PLAN: Janet [name] will  receive her 7th weekly dose of carboplatin/paclitaxel today as planned through mid February, completing a total of 12 weekly doses. In addition, she will see Dr. Lucia Gaskins on January 22, is being scheduled for repeat breast MRI  in mid February after the completion of her neoadjuvant chemotherapy, and we'll see Dr. Jana Hakim soon thereafter to review those results.  As noted above, she is also interested in pursuing additional consult with regards to genetic counseling. We'll refer her to Roma Kayser, and I hope we will be able to make her an appointment later this month once Janet [name] is able to gather all of the information from her family. It would be great to have these results prior to her surgery if she decides to pursue genetic testing herself.  I will note that Janet [name] tends to have a slightly reduced ANC which is 1.3 today. We will continue to treat as long as her [place] is equal to or greater than 1.0. I have spoken with our charge nurse and our pharmacy staff today with regards to these guidelines.  Janet [name] voices her understanding and agreement with the above plan and will call with any problems prior to her next scheduled appointment.   Stacee Earp, PA-C   07/30/2013 10:07 PM

## 2013-07-31 ENCOUNTER — Telehealth: Payer: Self-pay | Admitting: *Deleted

## 2013-07-31 NOTE — Telephone Encounter (Signed)
Called pt to inform her of the appt that will be made with genetics. No answer. Left detailed message for her to get the information on the relatives who tested positve and their relation to her. I told her once she gets that info to call us and an appt will be made for her. Told her to call me if she has any questions about the message I left. Message to be forwarded to [employer].

## 2013-08-01 ENCOUNTER — Other Ambulatory Visit: Payer: Self-pay | Admitting: *Deleted

## 2013-08-01 ENCOUNTER — Encounter: Payer: Self-pay | Admitting: Physician Assistant

## 2013-08-01 DIAGNOSIS — C50411 Malignant neoplasm of upper-outer quadrant of right female breast: Secondary | ICD-10-CM

## 2013-08-01 MED ORDER — PROCHLORPERAZINE MALEATE 10 MG PO TABS: 10.0000 mg | ORAL_TABLET | Freq: Four times a day (QID) | ORAL | Status: DC | PRN

## 2013-08-01 NOTE — Telephone Encounter (Signed)
MyChart request received requesting refill on compazine.

## 2013-08-02 ENCOUNTER — Encounter: Payer: Self-pay | Admitting: *Deleted

## 2013-08-02 ENCOUNTER — Telehealth: Payer: Self-pay | Admitting: *Deleted

## 2013-08-02 ENCOUNTER — Other Ambulatory Visit: Payer: Self-pay | Admitting: *Deleted

## 2013-08-02 NOTE — Progress Notes (Signed)
Previous note stated that I confirmed pt for 1/13 and it was a typo.  I confirmed her for Monday, 08/05/13.  Thank you!

## 2013-08-02 NOTE — Telephone Encounter (Signed)
Called pt and confirmed 08/06/13 genetic appt.  Unable to schedule the appt for Janet [name] - emailed Audie Clear to enter it or open it where I can.  Emailed Janet [name], [place], Amy & Dr. Jana Hakim to make them aware.

## 2013-08-05 ENCOUNTER — Other Ambulatory Visit: Payer: BC Managed Care – PPO

## 2013-08-05 ENCOUNTER — Encounter: Payer: Self-pay | Admitting: Physician Assistant

## 2013-08-05 ENCOUNTER — Telehealth: Payer: Self-pay | Admitting: *Deleted

## 2013-08-05 ENCOUNTER — Encounter: Payer: BC Managed Care – PPO | Admitting: Genetic Counselor

## 2013-08-05 NOTE — Telephone Encounter (Signed)
Spoke to pt about cancelling her appt and r/s to 08/08/12 at 2:00pm.  Pt confirmed new appt date and time.  Denies needs at this time.

## 2013-08-06 ENCOUNTER — Other Ambulatory Visit (HOSPITAL_BASED_OUTPATIENT_CLINIC_OR_DEPARTMENT_OTHER): Payer: BC Managed Care – PPO

## 2013-08-06 ENCOUNTER — Ambulatory Visit (HOSPITAL_BASED_OUTPATIENT_CLINIC_OR_DEPARTMENT_OTHER): Payer: BC Managed Care – PPO | Admitting: Physician Assistant

## 2013-08-06 ENCOUNTER — Encounter: Payer: Self-pay | Admitting: Physician Assistant

## 2013-08-06 ENCOUNTER — Other Ambulatory Visit: Payer: Self-pay | Admitting: Oncology

## 2013-08-06 VITALS — BP 110/67 | HR 76 | Temp 98.0°F | Resp 20 | Ht 69.0 in | Wt 119.1 lb

## 2013-08-06 DIAGNOSIS — Z171 Estrogen receptor negative status [ER-]: Secondary | ICD-10-CM

## 2013-08-06 DIAGNOSIS — C50411 Malignant neoplasm of upper-outer quadrant of right female breast: Secondary | ICD-10-CM

## 2013-08-06 DIAGNOSIS — L02619 Cutaneous abscess of unspecified foot: Secondary | ICD-10-CM

## 2013-08-06 DIAGNOSIS — C50119 Malignant neoplasm of central portion of unspecified female breast: Secondary | ICD-10-CM

## 2013-08-06 DIAGNOSIS — L03039 Cellulitis of unspecified toe: Secondary | ICD-10-CM

## 2013-08-06 DIAGNOSIS — D649 Anemia, unspecified: Secondary | ICD-10-CM

## 2013-08-06 DIAGNOSIS — G43909 Migraine, unspecified, not intractable, without status migrainosus: Secondary | ICD-10-CM

## 2013-08-06 DIAGNOSIS — C50919 Malignant neoplasm of unspecified site of unspecified female breast: Secondary | ICD-10-CM

## 2013-08-06 LAB — CBC WITH DIFFERENTIAL/PLATELET
BASO%: 0.8 % (ref 0.0–2.0)
BASOS ABS: 0 10*3/uL (ref 0.0–0.1)
EOS ABS: 0 10*3/uL (ref 0.0–0.5)
EOS%: 1 % (ref 0.0–7.0)
HCT: 32.1 % — ABNORMAL LOW (ref 34.8–46.6)
HGB: 11.1 g/dL — ABNORMAL LOW (ref 11.6–15.9)
LYMPH%: 31.7 % (ref 14.0–49.7)
MCH: 35.9 pg — ABNORMAL HIGH (ref 25.1–34.0)
MCHC: 34.7 g/dL (ref 31.5–36.0)
MCV: 103.5 fL — AB (ref 79.5–101.0)
MONO#: 0.2 10*3/uL (ref 0.1–0.9)
MONO%: 7.3 % (ref 0.0–14.0)
NEUT%: 59.2 % (ref 38.4–76.8)
NEUTROS ABS: 1.5 10*3/uL (ref 1.5–6.5)
PLATELETS: 144 10*3/uL — AB (ref 145–400)
RBC: 3.1 10*6/uL — ABNORMAL LOW (ref 3.70–5.45)
RDW: 13.7 % (ref 11.2–14.5)
WBC: 2.5 10*3/uL — ABNORMAL LOW (ref 3.9–10.3)
lymph#: 0.8 10*3/uL — ABNORMAL LOW (ref 0.9–3.3)

## 2013-08-06 LAB — COMPREHENSIVE METABOLIC PANEL (CC13)
ALK PHOS: 59 U/L (ref 40–150)
ALT: 29 U/L (ref 0–55)
AST: 23 U/L (ref 5–34)
Albumin: 4 g/dL (ref 3.5–5.0)
Anion Gap: 9 mEq/L (ref 3–11)
BILIRUBIN TOTAL: 0.37 mg/dL (ref 0.20–1.20)
BUN: 14.4 mg/dL (ref 7.0–26.0)
CO2: 23 mEq/L (ref 22–29)
CREATININE: 0.8 mg/dL (ref 0.6–1.1)
Calcium: 10 mg/dL (ref 8.4–10.4)
Chloride: 106 mEq/L (ref 98–109)
GLUCOSE: 94 mg/dL (ref 70–140)
Potassium: 4.1 mEq/L (ref 3.5–5.1)
Sodium: 138 mEq/L (ref 136–145)
Total Protein: 6.1 g/dL — ABNORMAL LOW (ref 6.4–8.3)

## 2013-08-06 NOTE — Progress Notes (Signed)
Patient ID: Janet [name], female   DOB: 03-15-1956, 58 y.o.   MRN: [id number] ID: Janet [name] OB: 27-Jul-1955  MR#: [id number]  RJJ#:[id number]  PCP: Janet Casco, MD GYN:   SUOsborn [name]; Janet [name] OTHER MD: Janet [name], Janet [name]  CHIEF COMPLAINT:  Right Breast Cancer/Neoadjuvant Chemo   HISTORY OF PRESENT ILLNESS: "Janet [name]" herself noted a lump in her right breast on 03/17/2013, and immediately contacted her physician so that on 03/18/2013 bilateral diagnostic mammography and right breast ultrasonography at the breast Center showed a developing mass in the subareolar region of the right breast. There were no malignant type calcifications. This mass was palpable in the upper outer quadrant, and by ultrasound measured 2.6 cm. Ultrasonography of the right axilla showed a lymph node measuring 1.1 cm, with a thickened cortex. The left breast was unremarkable.  Biopsy of the right breast mass 08/26//2014 showed ([name]) and invasive ductal carcinoma, grade 3, triple negative, with an MIB-1 of 54%. Biopsy of the right axillary lymph node in question at the same time was negative.  Bilateral breast MRI 03/26/2013 measured the right subareolar mass at 3.8 cm. The previously biopsied benign right axillary lymph node was again noted. There were no other suspicious areas in the right breast or axilla and the left breast was unremarkable.  Her subsequent history is as detailed below  INTERVAL HISTORY: Janet [name] returns alone today for followup of her locally advanced right breast cancer. She receives weekly carboplatin/paclitaxel and is due for her 8th of 12 planned weekly doses tomorrow, 08/07/2013.  She continues to tolerate treatment well with no new complaints today. She is still working full-time, exercising daily, and [name] "feels great".  Janet [name] has had some nail dyscrasia, with recent cellulitis and pain in the left great toe nail. She's been soaking her feet twice  daily in warm water and Epsom salts, and the discoloration and tenderness has completely resolved.  REVIEW OF SYSTEMS: Janet [name] has had no recent illnesses and denies fevers, chills, or night sweats.  She's had no mouth ulcers or oral sensitivity and denies any problems swallowing.  Her appetite is good, and she is keeping herself well hydrated. She denies any  problems with nausea or emesis and has had no  change in bowel or bladder habits.  She has no cough,  increased shortness of breath, chest pain, or palpitations.  She  has a history of migraine headaches, and these are stable. She uses Imitrex affectively when needed.    She's had no dizziness or change in vision.   She's had no abnormal bruising or bleeding. She also denies any new or unusual myalgias, arthralgias, or bony pain, and has had no peripheral swelling.  She denies any signs of numbness or tingling in either the upper or lower extremities.  Otherwise a detailed review of systems today was noncontributory  PAST MEDICAL HISTORY: Past Medical History  Diagnosis Date  . Migraines   . Cancer     breast  . Breast cancer   . Allergy   . Anemia, unspecified 07/09/2013    PAST SURGICAL HISTORY: Past Surgical History  Procedure Laterality Date  . Removal of cataracts    . Foot surgery  age 67    bone graft  . Portacath placement N/A 04/04/2013    Procedure: INSERTION PORT-A-CATH;  Surgeon: Janet Lasso, MD;  Location: [facility];  Service: General;  Laterality: N/A;    FAMILY HISTORY Family History  Problem Relation Age of Onset  .  Pancreatic cancer Mother   . Breast cancer Maternal Grandmother    the patient's father died at the age of 66 with pancreatic cancer. (He was Janet [name]). [name] is alive at age 62. Janet [name] has one brother, no sisters. [name]'s mother died from breast cancer at the age of 68. There is no history of ovarian cancer in the family  GYNECOLOGIC HISTORY:  Menarche  age 27, first live birth age 31. She is GX P2. She stopped having periods approximately 2004. She did not take hormone replacement. She did take birth control pills remotely for 5-10 years, with no complications.  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 Her husband the [place]. AQuentin Cornwall "Robby" [name], is a local judge. The patient's 2 children are Janet [name] who lives in [place] and works as a Public affairs consultant, and Janet [name], who works in [place] and is an associate with a Interior and spatial designer group.  ADVANCED DIRECTIVES: In place   HEALTH MAINTENANCE: (Updated 06/11/2013) History  Substance Use Topics  . Smoking status: Never Smoker   . Smokeless tobacco: Never Used  . Alcohol Use: Yes     Comment: occasional     Colonoscopy: Not on file  PAP:  Oct 2014, Dr. Laurann [name]  Bone density: July 2013 at [facility],    osteopenia  Lipid panel: UTD, Dr. Laurann [name]   Allergies  Allergen Reactions  . Penicillins   . Azithromycin Rash    Current Outpatient Prescriptions  Medication Sig Dispense Refill  . acetaminophen (TYLENOL) 325 MG tablet Take 650 mg by mouth every 6 (six) hours as needed for pain.      Marland Kitchen Alum & Mag Hydroxide-Simeth (MAGIC MOUTHWASH W/LIDOCAINE) SOLN Take 5 mLs by mouth 4 (four) times daily as needed. For throat or mouth pain  240 mL  1  . lidocaine-prilocaine (EMLA) cream Apply topically as needed.  30 g  0  . LORazepam (ATIVAN) 0.5 MG tablet Take 1 tablet (0.5 mg total) by mouth at bedtime as needed for anxiety or sleep (or nausea).  30 tablet  0  . Multiple Vitamin (MULTI-VITAMIN DAILY PO) Take by mouth daily.      . prochlorperazine (COMPAZINE) 10 MG tablet Take 1 tablet (10 mg total) by mouth every 6 (six) hours as needed (Nausea or vomiting).  30  tablet  1  . SUMAtriptan (IMITREX) 50 MG tablet Take 1 tablet (50 mg total) by mouth every 2 (two) hours as needed for migraine. May repeat in 2 hours if headache persists or recurs.  10 tablet  0  . CALCIUM PO Take by mouth daily.      . polyethylene glycol powder (MIRALAX) powder Take 17 g by mouth daily.  255 g  0   No current facility-administered medications for this visit.    OBJECTIVE: Middle-aged white woman in no acute distress  Filed Vitals:   08/06/13 0909  BP: 110/67  Pulse: 76  Temp: 98 F (36.7 C)  Resp: 20     Body mass index is 17.58 kg/(m^2).    ECOG FS:0 Filed Weights   08/06/13 0909  Weight: 119 lb 1.6 oz (54.023 kg)   Physical Exam: HEENT:  Sclerae anicteric.  Oropharynx clear and moist with no  ulcerations or oropharyngeal  candidiasis.   Neck is supple, trachea midline  NODES:  No cervical or supraclavicular lymphadenopathy palpated.  BREAST EXAM: Deferred.    Axillae are benign bilaterally, with no palpable lymphadenopathy. LUNGS:  Clear to auscultation bilaterally with good excursion.  No wheezes or rhonchi. HEART:  Regular rate and rhythm.  No murmur or rubs appreciated.  ABDOMEN:  Soft, thin, nontender to palpation. No organomegaly or masses palpated.  Positive bowel sounds.  MSK:  No focal spinal tenderness to palpation. Good range of motion in the upper extremities.  No joint swelling.  EXTREMITIES:  No peripheral edema.   SKIN:  Skin is clear with no visible rashes or skin lesions. The toe nail of the left great toe appears normal, with no hyperpigmentation, no bleeding, no drainage, no erythema of the surrounding skin. The port is intact in the left upper chest wall with no erythema, edema, or evidence of infection/cellulitis. NEURO:  Nonfocal. Well oriented.  Positive affect.   LAB RESULTS:  CMP     Component Value Date/Time   NA 138 08/06/2013 0850   K 4.1 08/06/2013 0850   CO2 23 08/06/2013 0850   GLUCOSE 94 08/06/2013 0850   BUN 14.4 08/06/2013  0850   CREATININE 0.8 08/06/2013 0850   CALCIUM 10.0 08/06/2013 0850   PROT 6.1* 08/06/2013 0850   ALBUMIN 4.0 08/06/2013 0850   AST 23 08/06/2013 0850   ALT 29 08/06/2013 0850   ALKPHOS 59 08/06/2013 0850   BILITOT 0.37 08/06/2013 0850     Lab Results  Component Value Date   WBC 2.5* 08/06/2013   NEUTROABS 1.5 08/06/2013   HGB 11.1* 08/06/2013   HCT 32.1* 08/06/2013   MCV 103.5* 08/06/2013   PLT 144* 08/06/2013      STUDIES:  Mr Breast Bilateral W Wo Contrast  06/05/2013   CLINICAL DATA:  Patient with right breast carcinoma being treated with neoadjuvant chemotherapy. Re-evaluate tumor size.  EXAM: MR BILATERAL BREAST WITHOUT AND WITH CONTRAST  TECHNIQUE: Multiplanar, multisequence MR images of both breasts were obtained prior to and following the intravenous administration of 4m of MultiHance.  THREE-DIMENSIONAL MR IMAGE RENDERING ON INDEPENDENT WORKSTATION:  Three-dimensional MR images were rendered by post-processing of the original MR data on an independent workstation. The three-dimensional MR images were interpreted, and findings are reported in the following complete MRI report for this study.  COMPARISON:  Previous exams  FINDINGS: Breast composition: d. Extreme fibroglandular tissue  Background parenchymal enhancement: Moderate  Right breast: Very minimal residual punctate enhancement is demonstrated within the subareolar region of the right breast at the site of biopsied right breast malignancy. Scattered foci of enhancement within the right breast.  Left breast: No mass or abnormal enhancement.  Lymph nodes: No abnormal appearing lymph nodes.  Ancillary findings:  None.  IMPRESSION: Very minimal residual punctate enhancement is demonstrated at the site of previously biopsied right breast malignancy, compatible with treatment response.  RECOMMENDATION: Treatment plan.  BI-RADS CATEGORY  6: Known biopsy-proven malignancy - appropriate action should be taken.   Electronically Signed   By:  DLovey NewcomerM.D.   On: 06/05/2013 16:42    ASSESSMENT: 58y.o. [place] woman   (1)  status post right breast biopsy 03/19/2013 for a clinical T2 N0, stage IIA invasive ductal breast cancer, grade 3, triple negative, with an MIB-1 of 54%.  (2) suspicious right axillary lymph node biopsy negative  (3) received four cycles of dose dense doxorubicin/ cyclophosphamide between 04/17/2013 and 05/29/2013 ,  at standard doses, with Neulasta support;  (4) on  06/12/2013 starting 12 weekly doses of carboplatin/ paclitaxel.  (A treatment break was given on 07/16/2013 due to the Christmas holiday, with the 12th cycle to be added on at the end.)  (5) anticipates definitive surgery 09/30/2013 (Spring Break week)   PLAN: Janet [name] will  return tomorrow for her 8th weekly dose of carboplatin/paclitaxel.  We will continue to treat weekly, and we'll follow closely on weekly basis with labs and physical exam.  She has gotten new information from her family regarding a known BRCA1 mutation, and is scheduled to meet with our genetics counselor later this week to review those results and consider genetic testing. She is then scheduled to meet with Dr. Lucia Gaskins on January 22 to discuss her upcoming surgery. She is scheduled for chemotherapy through mid February, with an MRI scheduled on February 17.  She is hoping to have her surgery approximately March 9, coordinating it with her "spring break".   today as planned through mid February, completing a total of 12 weekly doses. In addition, she will see Dr. Lucia Gaskins on January 22, is being scheduled for repeat breast MRI in mid February after the completion of her neoadjuvant chemotherapy, and we'll see Dr. Jana Hakim soon thereafter to review those results.  I will note that, although it is normal today,  Janet [name] tends to have a slightly reduced ANC. We will continue to treat as long as her [place] is equal to or greater than 1.0. I have spoken with our pharmacy staff with regards  to these guidelines.  Janet [name] voices her understanding and agreement with the above plan and will call with any problems prior to her next scheduled appointment.   Ajla Mcgeachy, PA-C   08/06/2013 10:17 AM

## 2013-08-07 ENCOUNTER — Ambulatory Visit (HOSPITAL_BASED_OUTPATIENT_CLINIC_OR_DEPARTMENT_OTHER): Payer: BC Managed Care – PPO

## 2013-08-07 VITALS — BP 105/63 | HR 74 | Temp 97.8°F | Resp 98 | Ht 69.0 in

## 2013-08-07 DIAGNOSIS — C50119 Malignant neoplasm of central portion of unspecified female breast: Secondary | ICD-10-CM

## 2013-08-07 DIAGNOSIS — Z5111 Encounter for antineoplastic chemotherapy: Secondary | ICD-10-CM

## 2013-08-07 DIAGNOSIS — C50411 Malignant neoplasm of upper-outer quadrant of right female breast: Secondary | ICD-10-CM

## 2013-08-07 MED ORDER — ONDANSETRON 16 MG/50ML IVPB (CHCC)
INTRAVENOUS | Status: AC
  Filled 2013-08-07: qty 16

## 2013-08-07 MED ORDER — SODIUM CHLORIDE 0.9 % IV SOLN
80.0000 mg/m2 | Freq: Once | INTRAVENOUS | Status: AC
  Administered 2013-08-07: 132 mg via INTRAVENOUS
  Filled 2013-08-07: qty 22

## 2013-08-07 MED ORDER — SODIUM CHLORIDE 0.9 % IV SOLN
207.6000 mg | Freq: Once | INTRAVENOUS | Status: AC
  Administered 2013-08-07: 210 mg via INTRAVENOUS
  Filled 2013-08-07: qty 21

## 2013-08-07 MED ORDER — DEXAMETHASONE SODIUM PHOSPHATE 20 MG/5ML IJ SOLN
20.0000 mg | Freq: Once | INTRAMUSCULAR | Status: AC
  Administered 2013-08-07: 20 mg via INTRAVENOUS

## 2013-08-07 MED ORDER — ONDANSETRON 16 MG/50ML IVPB (CHCC)
16.0000 mg | Freq: Once | INTRAVENOUS | Status: AC
  Administered 2013-08-07: 16 mg via INTRAVENOUS

## 2013-08-07 MED ORDER — FAMOTIDINE IN NACL 20-0.9 MG/50ML-% IV SOLN
20.0000 mg | Freq: Once | INTRAVENOUS | Status: AC
  Administered 2013-08-07: 20 mg via INTRAVENOUS

## 2013-08-07 MED ORDER — SODIUM CHLORIDE 0.9 % IV SOLN
Freq: Once | INTRAVENOUS | Status: AC
  Administered 2013-08-07: 14:00:00 via INTRAVENOUS

## 2013-08-07 MED ORDER — DIPHENHYDRAMINE HCL 50 MG/ML IJ SOLN
INTRAMUSCULAR | Status: AC
  Filled 2013-08-07: qty 1

## 2013-08-07 MED ORDER — SODIUM CHLORIDE 0.9 % IJ SOLN
10.0000 mL | INTRAMUSCULAR | Status: DC | PRN
  Administered 2013-08-07: 10 mL
  Filled 2013-08-07: qty 10

## 2013-08-07 MED ORDER — HEPARIN SOD (PORK) LOCK FLUSH 100 UNIT/ML IV SOLN
500.0000 [IU] | Freq: Once | INTRAVENOUS | Status: AC | PRN
  Administered 2013-08-07: 500 [IU]
  Filled 2013-08-07: qty 5

## 2013-08-07 MED ORDER — FAMOTIDINE IN NACL 20-0.9 MG/50ML-% IV SOLN
INTRAVENOUS | Status: AC
  Filled 2013-08-07: qty 50

## 2013-08-07 MED ORDER — DIPHENHYDRAMINE HCL 50 MG/ML IJ SOLN
25.0000 mg | Freq: Once | INTRAMUSCULAR | Status: AC
  Administered 2013-08-07: 25 mg via INTRAVENOUS

## 2013-08-07 MED ORDER — DEXAMETHASONE SODIUM PHOSPHATE 20 MG/5ML IJ SOLN
INTRAMUSCULAR | Status: AC
  Filled 2013-08-07: qty 5

## 2013-08-07 NOTE — Patient Instructions (Signed)
Cancer Center Discharge Instructions for Patients Receiving Chemotherapy  Today you received the following chemotherapy agents: Taxol, Carboplatin  To help prevent nausea and vomiting after your treatment, we encourage you to take your nausea medication as prescribed.    If you develop nausea and vomiting that is not controlled by your nausea medication, call the clinic.   BELOW ARE SYMPTOMS THAT SHOULD BE REPORTED IMMEDIATELY:  *FEVER GREATER THAN 100.5 F  *CHILLS WITH OR WITHOUT FEVER  NAUSEA AND VOMITING THAT IS NOT CONTROLLED WITH YOUR NAUSEA MEDICATION  *UNUSUAL SHORTNESS OF BREATH  *UNUSUAL BRUISING OR BLEEDING  TENDERNESS IN MOUTH AND THROAT WITH OR WITHOUT PRESENCE OF ULCERS  *URINARY PROBLEMS  *BOWEL PROBLEMS  UNUSUAL RASH Items with * indicate a potential emergency and should be followed up as soon as possible.  Feel free to call the clinic you have any questions or concerns. The clinic phone number is [phone].    

## 2013-08-08 ENCOUNTER — Ambulatory Visit (HOSPITAL_BASED_OUTPATIENT_CLINIC_OR_DEPARTMENT_OTHER): Payer: BC Managed Care – PPO | Admitting: Genetic Counselor

## 2013-08-08 ENCOUNTER — Encounter: Payer: Self-pay | Admitting: Genetic Counselor

## 2013-08-08 ENCOUNTER — Other Ambulatory Visit: Payer: BC Managed Care – PPO

## 2013-08-08 DIAGNOSIS — Z800 Family history of malignant neoplasm of digestive organs: Secondary | ICD-10-CM

## 2013-08-08 DIAGNOSIS — Z803 Family history of malignant neoplasm of breast: Secondary | ICD-10-CM

## 2013-08-08 DIAGNOSIS — IMO0002 Reserved for concepts with insufficient information to code with codable children: Secondary | ICD-10-CM

## 2013-08-08 DIAGNOSIS — C50019 Malignant neoplasm of nipple and areola, unspecified female breast: Secondary | ICD-10-CM

## 2013-08-08 DIAGNOSIS — C50411 Malignant neoplasm of upper-outer quadrant of right female breast: Secondary | ICD-10-CM

## 2013-08-08 DIAGNOSIS — Z8041 Family history of malignant neoplasm of ovary: Secondary | ICD-10-CM

## 2013-08-08 NOTE — Progress Notes (Signed)
Dr.  Sarajane Jews Magrinat requested a consultation for genetic counseling and risk assessment for Janet [name], a 58 y.o. female, for discussion of her personal history of breast cancer, family history of pancreatic, ovarian and breast cancer and family history of a BRCA1 mutation.  She presents to clinic today to discuss the possibility of a genetic predisposition to cancer, and to further clarify her risks, as well as her family members' risks for cancer.   HISTORY OF PRESENT ILLNESS: In 2014, at the age of 73, Janet [name] was diagnosed with invasive ductal carcinoma of the breast. This was treated with chemotherapy, and currently a lumpectomy is planned for March, pending genetics.  If she is positive she may consider a double mastectomy.  Janet [name] has had a colonoscopy which was negative, and reports having some precancerous skin cancer on her face.  Janet [name]'s father's maternal first cousin has two daughters who have tested positive for a BRCA1 mutation.  As he had these daughters by two different women, he is the obligate carrier.  Based on his being an obligate carrier, the chances that Janet [name] is a carrier of the familial BRCA1 mutation (2575delC) is 1/16.      Past Medical History  Diagnosis Date  . Migraines   . Cancer     breast  . Breast cancer   . Allergy   . Anemia, unspecified 07/09/2013    Past Surgical History  Procedure Laterality Date  . Removal of cataracts    . Foot surgery  age 60    bone graft  . Portacath placement N/A 04/04/2013    Procedure: INSERTION PORT-A-CATH;  Surgeon: Haywood Lasso, MD;  Location: [facility];  Service: General;  Laterality: N/A;    History   Social History  . Marital Status: Married    Spouse Name: N/A    Number of Children: 2  . Years of Education: N/A   Occupational History  .  [employer]   Social History Main Topics  . Smoking status: Never Smoker   . Smokeless tobacco: Never Used  . Alcohol Use: Yes     Comment: occasional  . Drug  Use: No  . Sexual Activity: None   Other Topics Concern  . None   Social History Narrative  . None    REPRODUCTIVE HISTORY AND PERSONAL RISK ASSESSMENT FACTORS: Menarche was at age 21.   postmenopausal Uterus Intact: yes Ovaries Intact: yes G2P2A0, first live birth at age 33  She has not previously undergone treatment for infertility.     FAMILY HISTORY:  We obtained a detailed, 4-generation family history.  Significant diagnoses are listed below: Family History  Problem Relation Age of Onset  . Breast cancer Maternal Grandmother 68  . Pancreatic cancer Father 26  . Heart Problems Maternal Uncle   . Ovarian cancer Paternal Aunt     dx in her 74s-60s  . Stomach cancer Other     father's maternal grandmother  . Breast cancer Cousin     Father's maternal cousin's daughter; [place]    Patient's maternal ancestors are of [place] descent, and paternal ancestors are of [place] descent. There is no reported Ashkenazi Jewish ancestry. There is no known consanguinity.  GENETIC COUNSELING ASSESSMENT: Janet [name] is a 58 y.o. female with a personal history of triple negative breast cancer and a family history of pancreatic, ovarian stomach cancer and breast cancer along with a BRCA1 mutation which somewhat suggestive of a hereditary breast and ovarian cancer syndrome and  predisposition to cancer. We, therefore, discussed and recommended the following at today's visit.   DISCUSSION: We reviewed the characteristics, features and inheritance patterns of hereditary cancer syndromes. We also discussed genetic testing, including the appropriate family members to test, the process of testing, insurance coverage and turn-around-time for results. We reviewed BRCA mutations and the risk for cancer based on these results.  We discussed that her risk for carrying the same BRCA mutation, but statistics alone and placement in the family tree, is about 1 in 1.  The individual who has tested positive  for the BRCA mutation is quite distantly related, raising the concern that there could be something else going on in the family.  Therefore we will perform the Breast/Ovarian cancer panel to ensure that there is not an additional, or different, mutation running in the family.  PLAN: After considering the risks, benefits, and limitations, Janet [name] provided informed consent to pursue genetic testing and the blood sample will be sent to [facility] for analysis of the Breast/Ovarian Cancer Panel. We discussed the implications of a positive, negative and/ or variant of uncertain significance genetic test result. Results should be available within approximately 3 weeks' time, at which point they will be disclosed by telephone to Janet [name], as will any additional recommendations warranted by these results. Janet [name] will receive a summary of her genetic counseling visit and a copy of her results once available. This information will also be available in Epic. We encouraged Janet LOCASCIO to remain in contact with cancer genetics annually so that we can continuously update the family history and inform her of any changes in cancer genetics and testing that may be of benefit for her family. Janet [name]'s questions were answered to her satisfaction today. Our contact information was provided should additional questions or concerns arise.  The patient was seen for a total of 60 minutes, greater than 50% of which was spent face-to-face counseling.  This note will also be sent to the referring provider via the electronic medical record. The patient will be supplied with a summary of this genetic counseling discussion as well as educational information on the discussed hereditary cancer syndromes following the conclusion of their visit.   Patient was discussed with Dr. Marcy Panning.   _______________________________________________________________________ For Office Staff:  Number of people  involved in session: 1 Was an Intern/ student involved with case: no

## 2013-08-15 ENCOUNTER — Other Ambulatory Visit (INDEPENDENT_AMBULATORY_CARE_PROVIDER_SITE_OTHER): Payer: Self-pay | Admitting: Surgery

## 2013-08-15 ENCOUNTER — Encounter (INDEPENDENT_AMBULATORY_CARE_PROVIDER_SITE_OTHER): Payer: Self-pay | Admitting: Surgery

## 2013-08-15 ENCOUNTER — Ambulatory Visit: Payer: BC Managed Care – PPO

## 2013-08-15 ENCOUNTER — Telehealth: Payer: Self-pay | Admitting: Oncology

## 2013-08-15 ENCOUNTER — Other Ambulatory Visit (HOSPITAL_BASED_OUTPATIENT_CLINIC_OR_DEPARTMENT_OTHER): Payer: BC Managed Care – PPO

## 2013-08-15 ENCOUNTER — Ambulatory Visit (HOSPITAL_BASED_OUTPATIENT_CLINIC_OR_DEPARTMENT_OTHER): Payer: BC Managed Care – PPO | Admitting: Physician Assistant

## 2013-08-15 ENCOUNTER — Encounter: Payer: Self-pay | Admitting: Physician Assistant

## 2013-08-15 ENCOUNTER — Ambulatory Visit (INDEPENDENT_AMBULATORY_CARE_PROVIDER_SITE_OTHER): Payer: BC Managed Care – PPO | Admitting: Surgery

## 2013-08-15 VITALS — BP 125/70 | HR 83 | Temp 97.7°F | Resp 18 | Ht 69.0 in | Wt 118.4 lb

## 2013-08-15 VITALS — BP 100/72 | HR 72 | Temp 98.1°F | Resp 14 | Ht 68.5 in | Wt 118.4 lb

## 2013-08-15 DIAGNOSIS — C50419 Malignant neoplasm of upper-outer quadrant of unspecified female breast: Secondary | ICD-10-CM

## 2013-08-15 DIAGNOSIS — C50411 Malignant neoplasm of upper-outer quadrant of right female breast: Secondary | ICD-10-CM

## 2013-08-15 DIAGNOSIS — C50019 Malignant neoplasm of nipple and areola, unspecified female breast: Secondary | ICD-10-CM

## 2013-08-15 DIAGNOSIS — C50919 Malignant neoplasm of unspecified site of unspecified female breast: Secondary | ICD-10-CM

## 2013-08-15 DIAGNOSIS — Z171 Estrogen receptor negative status [ER-]: Secondary | ICD-10-CM

## 2013-08-15 DIAGNOSIS — D702 Other drug-induced agranulocytosis: Secondary | ICD-10-CM

## 2013-08-15 LAB — CBC WITH DIFFERENTIAL/PLATELET
BASO%: 0.6 % (ref 0.0–2.0)
Basophils Absolute: 0 10*3/uL (ref 0.0–0.1)
EOS%: 1.1 % (ref 0.0–7.0)
Eosinophils Absolute: 0 10*3/uL (ref 0.0–0.5)
HCT: 33.9 % — ABNORMAL LOW (ref 34.8–46.6)
HGB: 11.7 g/dL (ref 11.6–15.9)
LYMPH%: 40.4 % (ref 14.0–49.7)
MCH: 34.1 pg — ABNORMAL HIGH (ref 25.1–34.0)
MCHC: 34.5 g/dL (ref 31.5–36.0)
MCV: 98.8 fL (ref 79.5–101.0)
MONO#: 0.2 10*3/uL (ref 0.1–0.9)
MONO%: 10.7 % (ref 0.0–14.0)
NEUT#: 0.8 10*3/uL — ABNORMAL LOW (ref 1.5–6.5)
NEUT%: 47.2 % (ref 38.4–76.8)
PLATELETS: 159 10*3/uL (ref 145–400)
RBC: 3.43 10*6/uL — ABNORMAL LOW (ref 3.70–5.45)
RDW: 13.1 % (ref 11.2–14.5)
WBC: 1.8 10*3/uL — ABNORMAL LOW (ref 3.9–10.3)
lymph#: 0.7 10*3/uL — ABNORMAL LOW (ref 0.9–3.3)

## 2013-08-15 MED ORDER — CIPROFLOXACIN HCL 500 MG PO TABS: 500.0000 mg | ORAL_TABLET | Freq: Two times a day (BID) | ORAL | Status: DC

## 2013-08-15 NOTE — Progress Notes (Signed)
Patient ID: Janet [name], female   DOB: 07/28/1955, 58 y.o.   MRN: [id number] ID: [name] OB: 06/08/1956  MR#: [id number]  NWG#:[id number]  PCP: Osborne Casco, MD GYN:   Kasandra KnudsenOsborn Coho; [name] OTHER MD: Arloa Koh, Juanito Doom  CHIEF COMPLAINT:  Right Breast Cancer/Neoadjuvant Chemo   HISTORY OF PRESENT ILLNESS: "Janet [name]" herself noted a lump in her right breast on 03/17/2013, and immediately contacted her physician so that on 03/18/2013 bilateral diagnostic mammography and right breast ultrasonography at the breast Center showed a developing mass in the subareolar region of the right breast. There were no malignant type calcifications. This mass was palpable in the upper outer quadrant, and by ultrasound measured 2.6 cm. Ultrasonography of the right axilla showed a lymph node measuring 1.1 cm, with a thickened cortex. The left breast was unremarkable.  Biopsy of the right breast mass 08/26//2014 showed ([name]) and invasive ductal carcinoma, grade 3, triple negative, with an MIB-1 of 54%. Biopsy of the right axillary lymph node in question at the same time was negative.  Bilateral breast MRI 03/26/2013 measured the right subareolar mass at 3.8 cm. The previously biopsied benign right axillary lymph node was again noted. There were no other suspicious areas in the right breast or axilla and the left breast was unremarkable.  Her subsequent history is as detailed below  INTERVAL HISTORY: Janet [name] returns alone today for followup of her locally advanced right breast cancer. She receives weekly carboplatin/paclitaxel and is due for her 9th of 12 planned weekly doses today.  She has been tolerating treatment well, with no signs of peripheral neuropathy. Her only new complaint today is the fact that her mouth is sensitive and dry. She's had no actual ulcerations.  Interval history is notable for Janet [name] having met with our genetics counselor last week, and she did  decide to have genetic testing done. She hopes to have those results back around February 10. She's thinking that if the testing comes back positive, she wants to pursue a bilateral mastectomy. Otherwise, she thinks she will be happy with a right lumpectomy. She is scheduled to meet with Dr. Lucia Gaskins later today and they will discuss these options further.   REVIEW OF SYSTEMS: Otherwise, Janet [name] has  no complaints today. She has had no recent illnesses and denies fevers, chills, or night sweats.   she still has some nailbed changes on her toes, but these are not painful. She's had no drainage or evidence of infection. She's had no signs of abnormal bruising or bleeding.  Her appetite is good, and she denies any  problems with nausea or emesis.  She  has had no  change in bowel or bladder habits.  She has no cough,  increased shortness of breath, chest pain, or palpitations.  She  has a history of migraine headaches, and these are stable. She uses Imitrex affectively when needed.    She's had no dizziness or changes in vision.  She also denies any new or unusual myalgias, arthralgias, or bony pain, and has had no peripheral swelling.  She denies any signs of numbness or tingling in either the upper or lower extremities.  Otherwise a detailed review of systems today was noncontributory  PAST MEDICAL HISTORY: Past Medical History  Diagnosis Date  . Migraines   . Cancer     breast  . Breast cancer   . Allergy   . Anemia, unspecified 07/09/2013    PAST SURGICAL HISTORY: Past Surgical History  Procedure  Laterality Date  . Removal of cataracts    . Foot surgery  age 36    bone graft  . Portacath placement N/A 04/04/2013    Procedure: INSERTION PORT-A-CATH;  Surgeon: Haywood Lasso, MD;  Location: [facility];  Service: General;  Laterality: N/A;    FAMILY HISTORY Family History  Problem Relation Age of Onset  . Breast cancer Maternal Grandmother 68  . Pancreatic cancer Father 19  . Heart  Problems Maternal Uncle   . Ovarian cancer Paternal Aunt     dx in her 64s-60s  . Stomach cancer Other     father's maternal grandmother  . Breast cancer Cousin     Father's maternal cousin's daughter; [place]   the patient's father died at the age of 78 with pancreatic cancer. (He was Dr. Barbee Shropshire). [name] is alive at age 53. [name], no sisters. [name]'s mother died from breast cancer at the age of 15. There is no history of ovarian cancer in the family  GYNECOLOGIC HISTORY:  Menarche age 79, first live birth age 36. She is GX P2. She stopped having periods approximately 2004. She did not take hormone replacement. She did take birth control pills remotely for 5-10 years, with no complications.  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 Her husband the [employer]. AQuentin Cornwall "Robby" Borgmeyer, is a local judge. The patient's 2 children are [name] who lives in [place] and works as a Public affairs consultant, and [place], who works in [place] and is an associate with a Interior and spatial designer group.  ADVANCED DIRECTIVES: In place   HEALTH MAINTENANCE: (Updated 06/11/2013) History  Substance Use Topics  . Smoking status: Never Smoker   . Smokeless tobacco: Never Used  . Alcohol Use: Yes     Comment: occasional     Colonoscopy: Not on file  PAP:  Oct 2014, Dr. Laurann Montana  B[facility] density: July 2013 [facility],    osteopenia  Lipid panel: UTD, Dr. Laurann Montana   Allergies  Allergen Reactions  . Penicillins   . Azithromycin Rash    Current Outpatient Prescriptions  Medication Sig Dispense Refill  . acetaminophen (TYLENOL) 325 MG tablet Take 650 mg by mouth every 6 (six) hours as needed for pain.      Marland Kitchen Alum & Mag Hydroxide-Simeth  (MAGIC MOUTHWASH W/LIDOCAINE) SOLN Take 5 mLs by mouth 4 (four) times daily as needed. For throat or mouth pain  240 mL  1  . CALCIUM PO Take by mouth daily.      Marland Kitchen doxycycline (VIBRA-TABS) 100 MG tablet       . lidocaine-prilocaine (EMLA) cream Apply topically as needed.  30 g  0  . LORazepam (ATIVAN) 0.5 MG tablet Take 1 tablet (0.5 mg total) by mouth at bedtime as needed for anxiety or sleep (or nausea).  30 tablet  0  . Multiple Vitamin (MULTI-VITAMIN DAILY PO) Take by mouth daily.      . prochlorperazine (COMPAZINE) 10 MG tablet Take 1 tablet (10 mg total) by mouth every 6 (six) hours as needed (Nausea or vomiting).  30 tablet  1  . ciprofloxacin (CIPRO) 500 MG tablet Take 1 tablet (500 mg total) by mouth 2 (two) times daily.  14 tablet  0  . polyethylene glycol powder (MIRALAX) powder Take 17 g by mouth daily.  255 g  0  . SUMAtriptan (IMITREX) 50 MG tablet Take 1 tablet (50 mg total) by mouth every 2 (two) hours as needed for migraine. May repeat in 2 hours if headache persists or recurs.  10 tablet  0   No current facility-administered medications for this visit.    OBJECTIVE: Middle-aged white woman in no acute distress  Filed Vitals:   08/15/13 0912  BP: 125/70  Pulse: 83  Temp: 97.7 F (36.5 C)  Resp: 18     Body mass index is 17.48 kg/(m^2).    ECOG FS:0 Filed Weights   08/15/13 0912  Weight: 118 lb 6.4 oz (53.706 kg)   Physical Exam: HEENT:  Sclerae anicteric.  Oropharynx clear and moist. Buccal mucosa is mildly erythematous, as are the lateral edges of the tongue, but there no actual ulcerations and no evidence of oropharyngeal candidiasis noted.  Neck is supple, trachea midline  NODES:  No cervical or supraclavicular lymphadenopathy palpated.  BREAST EXAM: Deferred.    Axillae are benign bilaterally, with no palpable lymphadenopathy. LUNGS:  Clear to auscultation bilaterally with good excursion.  No wheezes or rhonchi. HEART:  Regular rate and rhythm.  No murmur  appreciated.  ABDOMEN:  Soft, thin, nontender to palpation. No organomegaly.  Positive, normoactive bowel sounds.  MSK:  No focal spinal tenderness to palpation. Good range of motion in the upper extremities.   EXTREMITIES:  No peripheral edema.   SKIN:  Skin is clear with no visible rashes or skin lesions. Mild nail dyscrasia as noted with hyperpigmentation. The port is intact in the left upper chest wall with no erythema, edema, or evidence of infection/cellulitis. NEURO:  Nonfocal. Well oriented.  Positive affect.   LAB RESULTS:  CMP     Component Value Date/Time   NA 138 08/06/2013 0850   K 4.1 08/06/2013 0850   CO2 23 08/06/2013 0850   GLUCOSE 94 08/06/2013 0850   BUN 14.4 08/06/2013 0850   CREATININE 0.8 08/06/2013 0850   CALCIUM 10.0 08/06/2013 0850   PROT 6.1* 08/06/2013 0850   ALBUMIN 4.0 08/06/2013 0850   AST 23 08/06/2013 0850   ALT 29 08/06/2013 0850   ALKPHOS 59 08/06/2013 0850   BILITOT 0.37 08/06/2013 0850     Lab Results  Component Value Date   WBC 1.8* 08/15/2013   NEUTROABS 0.8* 08/15/2013   HGB 11.7 08/15/2013   HCT 33.9* 08/15/2013   MCV 98.8 08/15/2013   PLT 159 08/15/2013      STUDIES:  Mr Breast Bilateral W Wo Contrast  06/05/2013   CLINICAL DATA:  Patient with right breast carcinoma being treated with neoadjuvant chemotherapy. Re-evaluate tumor size.  EXAM: MR BILATERAL BREAST WITHOUT AND WITH CONTRAST  TECHNIQUE: Multiplanar, multisequence MR images of both breasts were obtained prior to and following the intravenous administration of 42m of MultiHance.  THREE-DIMENSIONAL MR IMAGE RENDERING ON INDEPENDENT WORKSTATION:  Three-dimensional MR images were rendered by post-processing of the original MR data on an independent workstation. The three-dimensional MR images were interpreted, and findings are reported in the following complete MRI report for this study.  COMPARISON:  Previous exams  FINDINGS: Breast composition: d. Extreme fibroglandular tissue  Background  parenchymal enhancement: Moderate  Right breast: Very minimal residual punctate enhancement is demonstrated within the subareolar region of the right breast at the site of biopsied right breast malignancy. Scattered foci of enhancement within the  right breast.  Left breast: No mass or abnormal enhancement.  Lymph nodes: No abnormal appearing lymph nodes.  Ancillary findings:  None.  IMPRESSION: Very minimal residual punctate enhancement is demonstrated at the site of previously biopsied right breast malignancy, compatible with treatment response.  RECOMMENDATION: Treatment plan.  BI-RADS CATEGORY  6: Known biopsy-proven malignancy - appropriate action should be taken.   Electronically Signed   By: Lovey Newcomer M.D.   On: 06/05/2013 16:42    ASSESSMENT: 58 y.o. [place] woman   (1)  status post right breast biopsy 03/19/2013 for a clinical T2 N0, stage IIA invasive ductal breast cancer, grade 3, triple negative, with an MIB-1 of 54%.  (2) suspicious right axillary lymph node biopsy negative  (3) received four cycles of dose dense doxorubicin/ cyclophosphamide between 04/17/2013 and 05/29/2013 , at standard doses, with Neulasta support;  (4) on  06/12/2013 starting 12 weekly doses of carboplatin/ paclitaxel.  (A treatment break was given on 07/16/2013 due to the Christmas holiday, with the 12th cycle to be added on at the end.)  (5) anticipates definitive surgery 09/30/2013 (Spring Break week) with Dr. Lucia Gaskins  (6)  labs have been drawn for genetic testing, results pending   (7) chemotherapy-induced neutropenia, afebrile   PLAN: With an [place] of 0.8 today, we will hold Janet [name]'s 9th dose of carboplatin/paclitaxel. (We will likely just skip his treatment, rather than adding it on at the end, since she has scheduled her treatments and surgery so carefully.)  We reviewed neutropenic precautions, and she's going to take Cipro prophylactically for the next 7 days, 500 mg by mouth twice a day. She does  know to contact us with any fevers of 100 or above.   Otherwise, Janet [name] is scheduled to meet with Dr. Lucia Gaskins today. As noted above, she is awaiting the results of her genetics testing to make a decision about mastectomy versus lumpectomy. In case she does in fact need a bilateral mastectomy, she would like to go ahead and meet with plastic surgeon in late February.   We will continue to follow Janet [name] very closely, and I will see her again next week on January 27 for labs and physical exam in anticipation of her next weekly dose of carboplatin/paclitaxel on January 28.  She has already been scheduled for repeat breast MRI in mid February after the completion of her neoadjuvant chemotherapy, and will see Dr. Jana Hakim soon thereafter to review those results.  Janet [name] voices her understanding and agreement with the above plan and will call with any problems prior to her next scheduled appointment.   Eldwin Volkov, PA-C   08/15/2013 1:20 PM

## 2013-08-15 NOTE — Progress Notes (Addendum)
Re:   Janet [name] DOB:   07-Dec-1955 MRN:   [id number]  ASSESSMENT AND PLAN: 1.  Right breast cancer, T2, N0 - 11 o'clock  Grade 3, triple negative invasive ductal carcinoma.  Ki67 - 54%  [place] - 03/19/2014- right breast 11 o'clock in a subareolar location measuring 2.6 x 1.7 x 2.4 cm.  MRI - 03/20/2013 - 3.8 x 2.9 x 2.5 cm right breast mass.  Oncology - [facility]rin[facility]  She is undergoing neoadjuvant chemotx by Dr. Jana Hakim.  Though her MRI on 06/05/2013 looks better, she still has a mass at 11 o'clock with is palpable.  Janet [name]'s plan:  1) She should get her genetics back by 2/10 (she'll let me know the results), 2) Chemo therapy should end 09/04/2013, 3) repeat breast MRI - 2/17, 4) she wants surgery the week of 09/30/2013 (she is on spring break).  The plan for now is right breast needle loc lumpectomy and right axillary sentinel lymph node biopsy (of course, this could change with a positive genetics test - in fact, she has an appointment set up with D. Bowers scheduled after her genetics comes back, in case she should need a mastectomy)  2.  Migraines. [3.  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    Will move up appt to see Dr. Harlow Mares to consider bilateral mastectomy.  Hebert Soho delivered her children.  I spoke to T. Fontaine.  They will get CA125 and [place] of ovaries and see her.  DN 08/27/2013]  Chief Complaint  Patient presents with  . New Evaluation    new br cancer   REFERRING PHYSICIAN: Osborne Casco, MD  HISTORY OF PRESENT ILLNESS: Janet [name] is a 58 y.o. (DOB: 06-30-1956)  white  female whose primary care physician is Osborne Casco, MD and comes to me today for follow up of a right breast cancer - originally seen by Dr. Chilton Greathouse.  Janet [name] felt a right breast mass late summer of 2014.  This prompted a biopsy of her right breast biopsy ([name]) on 03/19/2013 which showed grade 3, triple negative invasive ductal carcinoma.  Ki67-54%.  A core right axillary lymph node  biopsy at the same time was negative.  MRI 03/20/2013 showed an enhancing right breast mass which measures 3.8 x 2.9 x 2.5 cm.   She has seen Dr. Jana Hakim, Chilton Greathouse, and Cristela Felt in the [facility] on 03/27/2013.  The plan was neoadjuvant chemotx and see how things go.  She had a power port placed by Dr. Margot Chimes 04/04/2013.  And she has received doxorubicin/cyclophosphamide 4 cycles followed by carboplaten/pacletaxel x 12 doses.  The mass has shown some improvement by MRI done 06/07/2013, but the patient still feels a mass that is about the same..  It sounds like at first she was not sure of genetics.  But she has a cousin who is BRCA 1 positive and she has seen Roma Kayser and her genetic test is pending.  Janet [name]'s father's maternal first cousin has two daughters who have tested positive for a BRCA1 mutation.  She wants surgery the week of 09/30/2013.     Past Medical History  Diagnosis Date  . Migraines   . Cancer     breast  . Breast cancer   . Allergy   . Anemia, unspecified 07/09/2013      Past Surgical History  Procedure Laterality Date  . Removal of cataracts    . Foot surgery  age 59    bone graft  .  Portacath placement N/A 04/04/2013    Procedure: INSERTION PORT-A-CATH;  Surgeon: Haywood Lasso, MD;  Location: [facility];  Service: General;  Laterality: N/A;      Current Outpatient Prescriptions  Medication Sig Dispense Refill  . acetaminophen (TYLENOL) 325 MG tablet Take 650 mg by mouth every 6 (six) hours as needed for pain.      Marland Kitchen Alum & Mag Hydroxide-Simeth (MAGIC MOUTHWASH W/LIDOCAINE) SOLN Take 5 mLs by mouth 4 (four) times daily as needed. For throat or mouth pain  240 mL  1  . ciprofloxacin (CIPRO) 500 MG tablet Take 1 tablet (500 mg total) by mouth 2 (two) times daily.  14 tablet  0  . lidocaine-prilocaine (EMLA) cream Apply topically as needed.  30 g  0  . LORazepam (ATIVAN) 0.5 MG tablet Take 1 tablet (0.5 mg total) by mouth at bedtime as needed for anxiety or sleep (or  nausea).  30 tablet  0  . Multiple Vitamin (MULTI-VITAMIN DAILY PO) Take by mouth daily.      . prochlorperazine (COMPAZINE) 10 MG tablet Take 1 tablet (10 mg total) by mouth every 6 (six) hours as needed (Nausea or vomiting).  30 tablet  1  . SUMAtriptan (IMITREX) 50 MG tablet Take 1 tablet (50 mg total) by mouth every 2 (two) hours as needed for migraine. May repeat in 2 hours if headache persists or recurs.  10 tablet  0   No current facility-administered medications for this visit.      Allergies  Allergen Reactions  . Penicillins   . Azithromycin Rash  Penicillin allergy was as a child - she is unsure, but will check with Mom.  REVIEW OF SYSTEMS: Skin:  No history of rash.  No history of abnormal moles. Infection:  No history of hepatitis or HIV.  No history of MRSA. Neurologic:  No history of stroke.  No history of seizure.  No history of headaches. Cardiac:  No history of hypertension. No history of heart disease.  No history of prior cardiac catheterization.  No history of seeing a cardiologist. Pulmonary:  Does not smoke cigarettes.  No asthma or bronchitis.  No OSA/CPAP.  Endocrine:  No diabetes. No thyroid disease. Gastrointestinal:  No history of stomach disease.  No history of liver disease.  No history of gall bladder disease.  No history of pancreas disease.  No history of colon disease. Urologic:  No history of kidney stones.  No history of bladder infections. Musculoskeletal:  No history of joint or back disease. Hematologic:  No bleeding disorder.  No history of anemia.  Not anticoagulated. Psycho-social:  The patient is oriented.   The patient has no obvious psychologic or social impairment to understanding our conversation and plan.  SOCIAL and FAMILY HISTORY: Married.  Husband Janet [name] is a judge. 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  PHYSICAL EXAM: BP 100/72  Pulse 72  Temp(Src) 98.1 F (36.7 C) (Temporal)  Resp 14  Ht 5' 8.5" (1.74 m)  Wt 118 lb  6.4 oz (53.706 kg)  BMI 17.74 kg/m2  General: Thin WN WF who is alert.  She is wearing a bonnett and has no hair. HEENT: Normal. Pupils equal. Neck: Supple. No mass.  No thyroid mass. Lymph Nodes:  No supraclavicular, cervical, or axillary nodes. Lungs: Clear to auscultation and symmetric breath sounds. Heart:  RRR. No murmur or rub. Breasts:  Right - 2.5 cm mass at the edge of the right areola at the 10 o'clock position  Left - No mass.  Port in upper inner left breast.  Abdomen: Soft. No mass. No tenderness. No hernia. Normal bowel sounds.  No abdominal scars. Rectal: Not done. Extremities:  Good strength and ROM  in upper and lower extremities. Neurologic:  Grossly intact to motor and sensory function. Psychiatric: Has normal mood and affect. Behavior is normal.   DATA REVIEWED: Epic notes  [name], MD,  [facility] [address] [place]  [place], [place]    [place] Phone:  [place]:  [phone]

## 2013-08-19 ENCOUNTER — Other Ambulatory Visit: Payer: Self-pay | Admitting: Physician Assistant

## 2013-08-19 DIAGNOSIS — C50411 Malignant neoplasm of upper-outer quadrant of right female breast: Secondary | ICD-10-CM

## 2013-08-19 DIAGNOSIS — D709 Neutropenia, unspecified: Secondary | ICD-10-CM

## 2013-08-19 DIAGNOSIS — D649 Anemia, unspecified: Secondary | ICD-10-CM

## 2013-08-20 ENCOUNTER — Encounter: Payer: Self-pay | Admitting: Physician Assistant

## 2013-08-20 ENCOUNTER — Ambulatory Visit (HOSPITAL_BASED_OUTPATIENT_CLINIC_OR_DEPARTMENT_OTHER): Payer: BC Managed Care – PPO | Admitting: Physician Assistant

## 2013-08-20 ENCOUNTER — Other Ambulatory Visit: Payer: Self-pay | Admitting: Oncology

## 2013-08-20 ENCOUNTER — Other Ambulatory Visit (HOSPITAL_BASED_OUTPATIENT_CLINIC_OR_DEPARTMENT_OTHER): Payer: BC Managed Care – PPO

## 2013-08-20 VITALS — BP 112/64 | HR 68 | Temp 98.4°F | Resp 18 | Ht 68.5 in | Wt 121.0 lb

## 2013-08-20 DIAGNOSIS — Z171 Estrogen receptor negative status [ER-]: Secondary | ICD-10-CM

## 2013-08-20 DIAGNOSIS — C50411 Malignant neoplasm of upper-outer quadrant of right female breast: Secondary | ICD-10-CM

## 2013-08-20 DIAGNOSIS — D709 Neutropenia, unspecified: Secondary | ICD-10-CM

## 2013-08-20 DIAGNOSIS — C50019 Malignant neoplasm of nipple and areola, unspecified female breast: Secondary | ICD-10-CM

## 2013-08-20 DIAGNOSIS — G43909 Migraine, unspecified, not intractable, without status migrainosus: Secondary | ICD-10-CM

## 2013-08-20 DIAGNOSIS — C50919 Malignant neoplasm of unspecified site of unspecified female breast: Secondary | ICD-10-CM

## 2013-08-20 LAB — CBC WITH DIFFERENTIAL/PLATELET
BASO%: 0.6 % (ref 0.0–2.0)
BASOS ABS: 0 10*3/uL (ref 0.0–0.1)
EOS%: 0.7 % (ref 0.0–7.0)
Eosinophils Absolute: 0 10*3/uL (ref 0.0–0.5)
HEMATOCRIT: 36 % (ref 34.8–46.6)
HEMOGLOBIN: 12.3 g/dL (ref 11.6–15.9)
LYMPH#: 0.6 10*3/uL — AB (ref 0.9–3.3)
LYMPH%: 25.1 % (ref 14.0–49.7)
MCH: 35.4 pg — ABNORMAL HIGH (ref 25.1–34.0)
MCHC: 34.3 g/dL (ref 31.5–36.0)
MCV: 103.1 fL — ABNORMAL HIGH (ref 79.5–101.0)
MONO#: 0.3 10*3/uL (ref 0.1–0.9)
MONO%: 14 % (ref 0.0–14.0)
NEUT#: 1.4 10*3/uL — ABNORMAL LOW (ref 1.5–6.5)
NEUT%: 59.6 % (ref 38.4–76.8)
Platelets: 155 10*3/uL (ref 145–400)
RBC: 3.49 10*6/uL — ABNORMAL LOW (ref 3.70–5.45)
RDW: 13.8 % (ref 11.2–14.5)
WBC: 2.3 10*3/uL — ABNORMAL LOW (ref 3.9–10.3)

## 2013-08-20 LAB — COMPREHENSIVE METABOLIC PANEL (CC13)
ALBUMIN: 4.1 g/dL (ref 3.5–5.0)
ALT: 26 U/L (ref 0–55)
ANION GAP: 8 meq/L (ref 3–11)
AST: 21 U/L (ref 5–34)
Alkaline Phosphatase: 59 U/L (ref 40–150)
BUN: 16.5 mg/dL (ref 7.0–26.0)
CHLORIDE: 108 meq/L (ref 98–109)
CO2: 23 meq/L (ref 22–29)
CREATININE: 0.7 mg/dL (ref 0.6–1.1)
Calcium: 10.6 mg/dL — ABNORMAL HIGH (ref 8.4–10.4)
Glucose: 95 mg/dl (ref 70–140)
POTASSIUM: 4.1 meq/L (ref 3.5–5.1)
Sodium: 140 mEq/L (ref 136–145)
Total Bilirubin: 0.44 mg/dL (ref 0.20–1.20)
Total Protein: 6.4 g/dL (ref 6.4–8.3)

## 2013-08-20 NOTE — Progress Notes (Signed)
Patient ID: Janet [name], female   DOB: 1956/06/06, 58 y.o.   MRN: [id number] ID: [name] OB: 1956-04-11  MR#: [id number]  GTX#:[id number]  PCP: Osborne Casco, MD GYN:   Kasandra KnudsenOsborn Coho; [name] OTHER MD: Arloa Koh, Juanito Doom  CHIEF COMPLAINT:  Right Breast Cancer/Neoadjuvant Chemo   HISTORY OF PRESENT ILLNESS: "Janet [name]" herself noted a lump in her right breast on 03/17/2013, and immediately contacted her physician so that on 03/18/2013 bilateral diagnostic mammography and right breast ultrasonography at the breast Center showed a developing mass in the subareolar region of the right breast. There were no malignant type calcifications. This mass was palpable in the upper outer quadrant, and by ultrasound measured 2.6 cm. Ultrasonography of the right axilla showed a lymph node measuring 1.1 cm, with a thickened cortex. The left breast was unremarkable.  Biopsy of the right breast mass 08/26//2014 showed ([name]) and invasive ductal carcinoma, grade 3, triple negative, with an MIB-1 of 54%. Biopsy of the right axillary lymph node in question at the same time was negative.  Bilateral breast MRI 03/26/2013 measured the right subareolar mass at 3.8 cm. The previously biopsied benign right axillary lymph node was again noted. There were no other suspicious areas in the right breast or axilla and the left breast was unremarkable.  Her subsequent history is as detailed below  INTERVAL HISTORY: Janet [name] returns alone today for followup of her locally advanced right breast cancer. She receives weekly carboplatin/paclitaxel in the neoadjuvant setting. Her ninth dose was held last week secondary to a febrile chemotherapy-induced neutropenia. Her counts have recovered nicely, and she is now ready for her 9th of 12 planned weekly doses today. Janet [name] just found out on her way to this appointment that her daughter has been accepted for graduate school at [employer]!   Janet [name]  completed a course of prophylactic Cipro, and has had no fevers or chills. She is feeling well today. She has some mild nail dyscrasia and dry skin, but denies any additional complaints today. Specifically, she continues to deny any peripheral neuropathy.   Interval history is notable for Janet [name] having met with Dr. Lucia Gaskins last week to discuss her upcoming surgery. As previously noted, at this time the plan is for a right lumpectomy, but this decision is dependent on her genetic testing results which are due in mid February. If in fact she is positive for a genetic mutation, it is likely that she will proceed with bilateral mastectomies.   REVIEW OF SYSTEMS: Janet [name] denies any rashes or skin changes other than dry skin and mild hyperpigmentation of the nailbeds. She has had no signs of abnormal bruising or bleeding.  Her appetite is good, and she denies any  problems with nausea or emesis.  She's having some mild constipation which she is managing well with diet. She's had no change in urinary habits.   She has no cough, shortness of breath, chest pain, or palpitations.  She still working full-time and exercising on a daily basis.She  has a history of migraine headaches, and these are stable. She uses Imitrex affectively when needed, but has taken only one tablet in the past 2-3 weeks.  She's had no dizziness or changes in vision.  She also denies any new or unusual myalgias, arthralgias, or bony pain, and has had no peripheral swelling.    Otherwise a detailed review of systems today was noncontributory  PAST MEDICAL HISTORY: Past Medical History  Diagnosis Date  . Migraines   .  Cancer     breast  . Breast cancer   . Allergy   . Anemia, unspecified 07/09/2013    PAST SURGICAL HISTORY: Past Surgical History  Procedure Laterality Date  . Removal of cataracts    . Foot surgery  age 68    bone graft  . Portacath placement N/A 04/04/2013    Procedure: INSERTION PORT-A-CATH;  Surgeon: Haywood Lasso, MD;  Location: [facility];  Service: General;  Laterality: N/A;    FAMILY HISTORY Family History  Problem Relation Age of Onset  . Breast cancer Maternal Grandmother 68  . Pancreatic cancer Father 43  . Heart Problems Maternal Uncle   . Ovarian cancer Paternal Aunt     dx in her 77s-60s  . Stomach cancer Other     father's maternal grandmother  . Breast cancer Cousin     Father's maternal cousin's daughter; Alto Denver   the patient's father died at the age of 73 with pancreatic cancer. (He was Dr. Barbee Shropshire). [name] is alive at age 53. [name], no sisters. [name]'s mother died from breast cancer at the age of 74. There is no history of ovarian cancer in the family  GYNECOLOGIC HISTORY:  Menarche age 63, first live birth age 49. She is GX P2. She stopped having periods approximately 2004. She did not take hormone replacement. She did take birth control pills remotely for 5-10 years, with no complications.  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 Her husband the [place] [facility]. AQuentin Cornwall "Robby" Kallstrom, is a local judge. The patient's 2 children are Janet [name] who lives in [place] and works as a Public affairs consultant, and Janet [name], who works in [place] and is an associate with a Interior and spatial designer group.  ADVANCED DIRECTIVES: In place   HEALTH MAINTENANCE: (Updated January 2015) History  Substance Use Topics  . Smoking status: Never Smoker   . Smokeless tobacco: Never Used  . Alcohol Use: Yes     Comment: occasional     Colonoscopy: Not on file  PAP:  Oct 2014, Dr. Laurann Montana  Bone density: July 2013 at [facility],    osteopenia  Lipid panel: UTD, Dr. Laurann Montana   Allergies  Allergen Reactions  . Penicillins   .  Azithromycin Rash    Current Outpatient Prescriptions  Medication Sig Dispense Refill  . acetaminophen (TYLENOL) 325 MG tablet Take 650 mg by mouth every 6 (six) hours as needed for pain.      Marland Kitchen Alum & Mag Hydroxide-Simeth (MAGIC MOUTHWASH W/LIDOCAINE) SOLN Take 5 mLs by mouth 4 (four) times daily as needed. For throat or mouth pain  240 mL  1  . ciprofloxacin (CIPRO) 500 MG tablet Take 1 tablet (500 mg total) by mouth 2 (two) times daily.  14 tablet  0  . lidocaine-prilocaine (EMLA) cream Apply topically as needed.  30 g  0  . Multiple Vitamin (MULTI-VITAMIN DAILY PO) Take by mouth daily.      Marland Kitchen LORazepam (ATIVAN) 0.5 MG tablet Take 1 tablet (0.5 mg total) by mouth at bedtime as needed for anxiety or sleep (or nausea).  30 tablet  0  . prochlorperazine (COMPAZINE) 10 MG tablet Take 1 tablet (10 mg total) by mouth  every 6 (six) hours as needed (Nausea or vomiting).  30 tablet  1  . SUMAtriptan (IMITREX) 50 MG tablet Take 1 tablet (50 mg total) by mouth every 2 (two) hours as needed for migraine. May repeat in 2 hours if headache persists or recurs.  10 tablet  0   No current facility-administered medications for this visit.    OBJECTIVE: Middle-aged white woman who appears comfortable and is in no acute distress  Filed Vitals:   08/20/13 1050  BP: 112/64  Pulse: 68  Temp: 98.4 F (36.9 C)  Resp: 18     Body mass index is 18.13 kg/(m^2).    ECOG FS:0 Filed Weights   08/20/13 1050  Weight: 121 lb (54.885 kg)   Physical Exam: HEENT:  Sclerae anicteric.  Oropharynx clear and moist. No oral ulcerations and no evidence of oropharyngeal candidiasis noted.  Neck is supple, trachea midline.  No thyromegaly. NODES:  No cervical or supraclavicular lymphadenopathy palpated.  BREAST EXAM: Deferred.  Axillae are benign bilaterally, with no palpable lymphadenopathy. LUNGS:  Clear to auscultation bilaterally.  No wheezes, rales, or rhonchi. HEART:  Regular rate and rhythm.  No murmur appreciated.   ABDOMEN:  Soft, thin, nontender to palpation. No organomegaly or masses.  Positive bowel sounds.  MSK:  No focal spinal tenderness to palpation. Good range of motion bilaterally in the upper extremities.   EXTREMITIES:  No peripheral edema.   SKIN:  Skin is benign with no visible rashes or skin lesions. Mild nail dyscrasia as noted with hyperpigmentation. The port is intact in the left upper chest wall with no erythema, edema, or evidence of infection/cellulitis. NEURO:  Nonfocal. Well oriented.  Positive affect.   LAB RESULTS:  CMP     Component Value Date/Time   NA 140 08/20/2013 1023   K 4.1 08/20/2013 1023   CO2 23 08/20/2013 1023   GLUCOSE 95 08/20/2013 1023   BUN 16.5 08/20/2013 1023   CREATININE 0.7 08/20/2013 1023   CALCIUM 10.6* 08/20/2013 1023   PROT 6.4 08/20/2013 1023   ALBUMIN 4.1 08/20/2013 1023   AST 21 08/20/2013 1023   ALT 26 08/20/2013 1023   ALKPHOS 59 08/20/2013 1023   BILITOT 0.44 08/20/2013 1023     Lab Results  Component Value Date   WBC 2.3* 08/20/2013   NEUTROABS 1.4* 08/20/2013   HGB 12.3 08/20/2013   HCT 36.0 08/20/2013   MCV 103.1* 08/20/2013   PLT 155 08/20/2013      STUDIES:  Mr Breast Bilateral W Wo Contrast  06/05/2013   CLINICAL DATA:  Patient with right breast carcinoma being treated with neoadjuvant chemotherapy. Re-evaluate tumor size.  EXAM: MR BILATERAL BREAST WITHOUT AND WITH CONTRAST  TECHNIQUE: Multiplanar, multisequence MR images of both breasts were obtained prior to and following the intravenous administration of 37m of MultiHance.  THREE-DIMENSIONAL MR IMAGE RENDERING ON INDEPENDENT WORKSTATION:  Three-dimensional MR images were rendered by post-processing of the original MR data on an independent workstation. The three-dimensional MR images were interpreted, and findings are reported in the following complete MRI report for this study.  COMPARISON:  Previous exams  FINDINGS: Breast composition: d. Extreme fibroglandular tissue  Background  parenchymal enhancement: Moderate  Right breast: Very minimal residual punctate enhancement is demonstrated within the subareolar region of the right breast at the site of biopsied right breast malignancy. Scattered foci of enhancement within the right breast.  Left breast: No mass or abnormal enhancement.  Lymph nodes: No abnormal appearing lymph nodes.  Ancillary  findings:  None.  IMPRESSION: Very minimal residual punctate enhancement is demonstrated at the site of previously biopsied right breast malignancy, compatible with treatment response.  RECOMMENDATION: Treatment plan.  BI-RADS CATEGORY  6: Known biopsy-proven malignancy - appropriate action should be taken.   Electronically Signed   By: Lovey Newcomer M.D.   On: 06/05/2013 16:42    ASSESSMENT: 58 y.o. [place] woman   (1)  status post right breast biopsy 03/19/2013 for a clinical T2 N0, stage IIA invasive ductal breast cancer, grade 3, triple negative, with an MIB-1 of 54%.  (2) suspicious right axillary lymph node biopsy negative  (3) received four cycles of dose dense doxorubicin/ cyclophosphamide between 04/17/2013 and 05/29/2013 , at standard doses, with Neulasta support;  (4) on  06/12/2013 starting 12 weekly doses of carboplatin/ paclitaxel.  (A treatment break was given on 07/16/2013 due to the Christmas holiday, with the 12th cycle to be added on at the end.)  (5) anticipates definitive surgery 09/30/2013 (Spring Break week) with Dr. Lucia Gaskins  (6)  labs have been drawn for genetic testing, results pending   (7) reduced neutrophils, afebrile   PLAN: Janet [name] will return tomorrow, January 28, as scheduled for her ninth weekly dose of carboplatin/paclitaxel. We will continue to see her on a weekly basis, and I will see her again next week on February 3 in anticipation of her 10th dose of carboplatin/paclitaxel on February 4. Final dose of chemotherapy is scheduled for February 11, and hopefully this will allow [place] to complete 11 weekly  doses. (We will actually skip last week's dose rather than making it up at the end of her regimen since she is planning on having surgery in early March.)  Janet [name] has already been scheduled for a repeat breast MRI in mid February after the completion of her neoadjuvant chemotherapy, and will see Dr. Jana Hakim soon thereafter to review those results.  Hopefully by that time, she will also have the results of her genetic testing.  Janet [name] voices her understanding and agreement with the above plan and will call with any problems prior to her next scheduled appointment.   Pasqualina Colasurdo, PA-C   08/20/2013 11:17 AM

## 2013-08-21 ENCOUNTER — Ambulatory Visit (HOSPITAL_BASED_OUTPATIENT_CLINIC_OR_DEPARTMENT_OTHER): Payer: BC Managed Care – PPO

## 2013-08-21 ENCOUNTER — Telehealth: Payer: Self-pay | Admitting: Genetic Counselor

## 2013-08-21 VITALS — BP 116/58 | HR 70 | Temp 97.6°F | Resp 18 | Ht 68.5 in

## 2013-08-21 DIAGNOSIS — Z5111 Encounter for antineoplastic chemotherapy: Secondary | ICD-10-CM

## 2013-08-21 DIAGNOSIS — C50119 Malignant neoplasm of central portion of unspecified female breast: Secondary | ICD-10-CM

## 2013-08-21 DIAGNOSIS — C50411 Malignant neoplasm of upper-outer quadrant of right female breast: Secondary | ICD-10-CM

## 2013-08-21 MED ORDER — SODIUM CHLORIDE 0.9 % IV SOLN
Freq: Once | INTRAVENOUS | Status: AC
  Administered 2013-08-21: 14:00:00 via INTRAVENOUS

## 2013-08-21 MED ORDER — FAMOTIDINE IN NACL 20-0.9 MG/50ML-% IV SOLN
INTRAVENOUS | Status: AC
  Filled 2013-08-21: qty 50

## 2013-08-21 MED ORDER — DIPHENHYDRAMINE HCL 50 MG/ML IJ SOLN
INTRAMUSCULAR | Status: AC
  Filled 2013-08-21: qty 1

## 2013-08-21 MED ORDER — HEPARIN SOD (PORK) LOCK FLUSH 100 UNIT/ML IV SOLN
500.0000 [IU] | Freq: Once | INTRAVENOUS | Status: AC | PRN
  Administered 2013-08-21: 500 [IU]
  Filled 2013-08-21: qty 5

## 2013-08-21 MED ORDER — DIPHENHYDRAMINE HCL 50 MG/ML IJ SOLN
25.0000 mg | Freq: Once | INTRAMUSCULAR | Status: AC
  Administered 2013-08-21: 25 mg via INTRAVENOUS

## 2013-08-21 MED ORDER — SODIUM CHLORIDE 0.9 % IJ SOLN
10.0000 mL | INTRAMUSCULAR | Status: DC | PRN
  Administered 2013-08-21: 10 mL
  Filled 2013-08-21: qty 10

## 2013-08-21 MED ORDER — ONDANSETRON 16 MG/50ML IVPB (CHCC)
16.0000 mg | Freq: Once | INTRAVENOUS | Status: AC
  Administered 2013-08-21: 16 mg via INTRAVENOUS

## 2013-08-21 MED ORDER — ONDANSETRON 16 MG/50ML IVPB (CHCC)
INTRAVENOUS | Status: AC
  Filled 2013-08-21: qty 16

## 2013-08-21 MED ORDER — DEXAMETHASONE SODIUM PHOSPHATE 20 MG/5ML IJ SOLN
20.0000 mg | Freq: Once | INTRAMUSCULAR | Status: AC
  Administered 2013-08-21: 20 mg via INTRAVENOUS

## 2013-08-21 MED ORDER — CARBOPLATIN CHEMO INJECTION 450 MG/45ML
207.6000 mg | Freq: Once | INTRAVENOUS | Status: AC
  Administered 2013-08-21: 210 mg via INTRAVENOUS
  Filled 2013-08-21: qty 21

## 2013-08-21 MED ORDER — PACLITAXEL CHEMO INJECTION 300 MG/50ML
80.0000 mg/m2 | Freq: Once | INTRAVENOUS | Status: AC
  Administered 2013-08-21: 132 mg via INTRAVENOUS
  Filled 2013-08-21: qty 22

## 2013-08-21 MED ORDER — DEXAMETHASONE SODIUM PHOSPHATE 20 MG/5ML IJ SOLN
INTRAMUSCULAR | Status: AC
  Filled 2013-08-21: qty 5

## 2013-08-21 MED ORDER — FAMOTIDINE IN NACL 20-0.9 MG/50ML-% IV SOLN
20.0000 mg | Freq: Once | INTRAVENOUS | Status: AC
  Administered 2013-08-21: 20 mg via INTRAVENOUS

## 2013-08-21 NOTE — Telephone Encounter (Signed)
Left message at home and work that we had her results back and to please call back.

## 2013-08-21 NOTE — Patient Instructions (Addendum)
[facility] Discharge Instructions for Patients Receiving Chemotherapy  Today you received the following chemotherapy agents Taxol/Carbo.  To help prevent nausea and vomiting after your treatment, we encourage you to take your nausea medication as prescribed.    If you develop nausea and vomiting that is not controlled by your nausea medication, call the clinic.   BELOW ARE SYMPTOMS THAT SHOULD BE REPORTED IMMEDIATELY:  *FEVER GREATER THAN 100.5 F  *CHILLS WITH OR WITHOUT FEVER  NAUSEA AND VOMITING THAT IS NOT CONTROLLED WITH YOUR NAUSEA MEDICATION  *UNUSUAL SHORTNESS OF BREATH  *UNUSUAL BRUISING OR BLEEDING  TENDERNESS IN MOUTH AND THROAT WITH OR WITHOUT PRESENCE OF ULCERS  *URINARY PROBLEMS  *BOWEL PROBLEMS  UNUSUAL RASH Items with * indicate a potential emergency and should be followed up as soon as possible.  Feel free to call the clinic should you have any questions or concerns. The clinic phone number is [phone].

## 2013-08-22 ENCOUNTER — Telehealth: Payer: Self-pay | Admitting: Genetic Counselor

## 2013-08-22 NOTE — Telephone Encounter (Signed)
Revealed positive BRCA1 mutation found.  Discussed other family members who would need to consider testing.  She will come in tomorrow to discuss further implications.

## 2013-08-23 ENCOUNTER — Encounter: Payer: Self-pay | Admitting: Genetic Counselor

## 2013-08-23 ENCOUNTER — Ambulatory Visit (HOSPITAL_BASED_OUTPATIENT_CLINIC_OR_DEPARTMENT_OTHER): Payer: BC Managed Care – PPO | Admitting: Genetic Counselor

## 2013-08-23 DIAGNOSIS — C50411 Malignant neoplasm of upper-outer quadrant of right female breast: Secondary | ICD-10-CM

## 2013-08-23 DIAGNOSIS — Z803 Family history of malignant neoplasm of breast: Secondary | ICD-10-CM

## 2013-08-23 DIAGNOSIS — C50119 Malignant neoplasm of central portion of unspecified female breast: Secondary | ICD-10-CM

## 2013-08-23 DIAGNOSIS — Z800 Family history of malignant neoplasm of digestive organs: Secondary | ICD-10-CM

## 2013-08-23 DIAGNOSIS — Z8041 Family history of malignant neoplasm of ovary: Secondary | ICD-10-CM

## 2013-08-23 DIAGNOSIS — IMO0002 Reserved for concepts with insufficient information to code with codable children: Secondary | ICD-10-CM

## 2013-08-23 DIAGNOSIS — Z1509 Genetic susceptibility to other malignant neoplasm: Secondary | ICD-10-CM

## 2013-08-23 DIAGNOSIS — Z1501 Genetic susceptibility to malignant neoplasm of breast: Secondary | ICD-10-CM | POA: Insufficient documentation

## 2013-08-23 NOTE — Progress Notes (Signed)
Janet [name], a 58 y.o. female, was seen with her husband, Cherlynn Janet [name], for discussion of her genetic test results finding a familial BRCA1 mutation.  She presents to clinic today to discuss the possibility of a genetic predisposition to cancer, and to further clarify her risks, as well as her family members' risks for cancer.   HISTORY OF PRESENT ILLNESS: In 2014, at the age of 12, Janet [name] was diagnosed with triple negative breast cancer. Janet [name] is currently undergoing chemotherapy, and was planning a lumpectomy, based on the genetic test results. She has had a negative colonoscopy in the past, and has been diagnosed with precancerous skin cells..      Past Medical History  Diagnosis Date  . Migraines   . Cancer     breast  . Breast cancer   . Allergy   . Anemia, unspecified 07/09/2013  . BRCA1 positive 2015    Past Surgical History  Procedure Laterality Date  . Removal of cataracts    . Foot surgery  age 70    bone graft  . Portacath placement N/A 04/04/2013    Procedure: INSERTION PORT-A-CATH;  Surgeon: Haywood Lasso, MD;  Location: [facility];  Service: General;  Laterality: N/A;    History   Social History  . Marital Status: Married    Spouse Name: N/A    Number of Children: 2  . Years of Education: N/A   Occupational History  .  [employer]   Social History Main Topics  . Smoking status: Never Smoker   . Smokeless tobacco: Never Used  . Alcohol Use: Yes     Comment: occasional  . Drug Use: No  . Sexual Activity: None   Other Topics Concern  . None   Social History Narrative  . None    FAMILY HISTORY:  We obtained a detailed, 4-generation family history.  Significant diagnoses are listed below: Family History  Problem Relation Age of Onset  . Breast cancer Maternal Grandmother 68  . Pancreatic cancer Father 19  . Heart Problems Maternal Uncle   . Ovarian cancer Paternal Aunt     dx in her 59s-60s  . Stomach cancer Other     father's maternal  grandmother  . Breast cancer Cousin     Father's maternal cousin's daughter; Alto [place]   Patient's ancestors are of Caucasian descent. There is no reported Ashkenazi Jewish ancestry. There is no known consanguinity.  GENETIC COUNSELING ASSESSMENT: Janet [name] is a 58 y.o. female with a personal history of triple negative breast cancer and a BRCA1 mutation and family history of breast, ovarian, and pancreatic cancer.  We, therefore, discussed and recommended the following at today's visit.   DISCUSSION: We reviewed autosomal dominant inheritance, natural history of hereditary breast/pancreatic cancer syndrome, and current NCCN medical management guidelines for individuals with BRCA mutations.  The patient was provided with a packet of support resources for individuals with a hereditary cancer syndrome, including information about the [employer] support organization, a copy of the NCCN guidelines, and information about genetic counselors in the [place] areas where her daughters live.  We discussed that BRCA1 is thought to be a hig risk gene, and to date most of the literature conveys a risk for breast cancer of about 57-84%, ovarian cancer of about 27-54%, prostate cancer of about 20% and pancreatic cancer of about 1-3%. We discussed NCCN guideline recommendations based on increased screening vs risk reducing surgery.  She was planning on having a  lumpectomy over her spring break, but is now getting more information about the utility of a double mastectomy.  We discussed her family members risk for having a BRCA1 mutation.  Her brother and children each have a 50% risk of having the same mutation.  Her paternal aunts and uncles are all deceased, and therefore her cousins each have a 25% chance of being a carrier, with the exception of the children of her aunt with ovarian cancer.  She is concerned about how to break the news to her children.  We discussed that the [employer] discussion forums may have  good information about ways to do this.  Some people find it easier to copy the letter I will send them and provide that to family members along with a copy of their test report.  We also discussed the risk for genetic discrimination.  The chances of this are low, but obviously there is some residual risk for this.  We recommend that her daughters consider getting life insurance prior to testing.  PLAN: Janet [name] will talk with Dr. Jana Hakim and her surgeon about the pros and cons of high risk breast screening vs double mastectomy.  She will also talk with her GYN about risk reducing BSO. We encouraged Janet [name] to remain in contact with cancer genetics annually so that we can continuously update the family history and inform her of any changes in cancer genetics and testing that may be of benefit for her family. Janet [name] [name]'s questions were answered to her satisfaction today. Our contact information was provided should additional questions or concerns arise.  The patient was seen for a total of 60 minutes, greater than 50% of which was spent face-to-face counseling.  This note will also be sent to the referring provider via the electronic medical record. The patient will be supplied with a summary of this genetic counseling discussion as well as educational information on the discussed hereditary cancer syndromes following the conclusion of their visit.    _______________________________________________________________________ For Office Staff:  Number of people involved in session: 2 Was an Intern/ student involved with case: no

## 2013-08-25 ENCOUNTER — Other Ambulatory Visit (INDEPENDENT_AMBULATORY_CARE_PROVIDER_SITE_OTHER): Payer: Self-pay | Admitting: Surgery

## 2013-08-25 DIAGNOSIS — C50911 Malignant neoplasm of unspecified site of right female breast: Secondary | ICD-10-CM

## 2013-08-26 ENCOUNTER — Other Ambulatory Visit: Payer: Self-pay | Admitting: Oncology

## 2013-08-26 ENCOUNTER — Encounter: Payer: Self-pay | Admitting: Physician Assistant

## 2013-08-26 NOTE — Progress Notes (Unsigned)
you do in fact have a mutation in one of your BRCA1 genes. 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

## 2013-08-27 ENCOUNTER — Encounter: Payer: Self-pay | Admitting: Physician Assistant

## 2013-08-27 ENCOUNTER — Other Ambulatory Visit (HOSPITAL_BASED_OUTPATIENT_CLINIC_OR_DEPARTMENT_OTHER): Payer: BC Managed Care – PPO

## 2013-08-27 ENCOUNTER — Ambulatory Visit (HOSPITAL_BASED_OUTPATIENT_CLINIC_OR_DEPARTMENT_OTHER): Payer: BC Managed Care – PPO | Admitting: Physician Assistant

## 2013-08-27 ENCOUNTER — Telehealth: Payer: Self-pay | Admitting: Gynecology

## 2013-08-27 VITALS — BP 125/77 | HR 74 | Temp 98.3°F | Resp 18 | Ht 68.5 in | Wt 118.6 lb

## 2013-08-27 DIAGNOSIS — D696 Thrombocytopenia, unspecified: Secondary | ICD-10-CM

## 2013-08-27 DIAGNOSIS — D6959 Other secondary thrombocytopenia: Secondary | ICD-10-CM

## 2013-08-27 DIAGNOSIS — C50119 Malignant neoplasm of central portion of unspecified female breast: Secondary | ICD-10-CM

## 2013-08-27 DIAGNOSIS — Z1501 Genetic susceptibility to malignant neoplasm of breast: Secondary | ICD-10-CM

## 2013-08-27 DIAGNOSIS — C50919 Malignant neoplasm of unspecified site of unspecified female breast: Secondary | ICD-10-CM

## 2013-08-27 DIAGNOSIS — Z1509 Genetic susceptibility to other malignant neoplasm: Secondary | ICD-10-CM

## 2013-08-27 DIAGNOSIS — C50411 Malignant neoplasm of upper-outer quadrant of right female breast: Secondary | ICD-10-CM

## 2013-08-27 DIAGNOSIS — Z171 Estrogen receptor negative status [ER-]: Secondary | ICD-10-CM

## 2013-08-27 DIAGNOSIS — D709 Neutropenia, unspecified: Secondary | ICD-10-CM

## 2013-08-27 LAB — CBC WITH DIFFERENTIAL/PLATELET
BASO%: 0.9 % (ref 0.0–2.0)
Basophils Absolute: 0 10*3/uL (ref 0.0–0.1)
EOS%: 0.9 % (ref 0.0–7.0)
Eosinophils Absolute: 0 10*3/uL (ref 0.0–0.5)
HCT: 34 % — ABNORMAL LOW (ref 34.8–46.6)
HGB: 11.8 g/dL (ref 11.6–15.9)
LYMPH#: 1 10*3/uL (ref 0.9–3.3)
LYMPH%: 44.8 % (ref 14.0–49.7)
MCH: 33.8 pg (ref 25.1–34.0)
MCHC: 34.7 g/dL (ref 31.5–36.0)
MCV: 97.4 fL (ref 79.5–101.0)
MONO#: 0.1 10*3/uL (ref 0.1–0.9)
MONO%: 6.3 % (ref 0.0–14.0)
NEUT#: 1.1 10*3/uL — ABNORMAL LOW (ref 1.5–6.5)
NEUT%: 47.1 % (ref 38.4–76.8)
Platelets: 114 10*3/uL — ABNORMAL LOW (ref 145–400)
RBC: 3.49 10*6/uL — ABNORMAL LOW (ref 3.70–5.45)
RDW: 12.5 % (ref 11.2–14.5)
WBC: 2.2 10*3/uL — ABNORMAL LOW (ref 3.9–10.3)

## 2013-08-27 NOTE — Telephone Encounter (Signed)
Janet [name] called reference patient with breast cancer and positive BRCA one testing. He asked about prophylactic salpingo-oophorectomy which I feel should be certainly discussed with the patient given her increased risk of ovarian cancer. I have agreed to set up an appointment with the patient and Dr. Toney Rakes who is her primary physician to discuss this. I do recommend a pelvic ultrasound and CA 125 preoperatively in the event there would be an overt abnormality that would be better addressed by a GYN oncologist.

## 2013-08-27 NOTE — Progress Notes (Signed)
Patient ID: Janet [name], female   DOB: 12/02/1955, 58 y.o.   MRN: [id number] ID: [name] OB: 06-04-1956  MR#: [id number]  HUD#:[id number]  PCP: Osborne Casco, MD GYN:  Dr. Uvaldo Rising;  Dr. Donalynn Furlong SU: Osborn Coho; [name] OTHER MD: Arloa Koh, Juanito Doom;  Crissie Reese, MD  CHIEF COMPLAINTS:   (1) Right Breast Cancer/Neoadjuvant     Chemo       (2)  BRCA1 positive   HISTORY OF PRESENT ILLNESS: "Janet [name]" herself noted a lump in her right breast on 03/17/2013, and immediately contacted her physician so that on 03/18/2013 bilateral diagnostic mammography and right breast ultrasonography at the breast Center showed a developing mass in the subareolar region of the right breast. There were no malignant type calcifications. This mass was palpable in the upper outer quadrant, and by ultrasound measured 2.6 cm. Ultrasonography of the right axilla showed a lymph node measuring 1.1 cm, with a thickened cortex. The left breast was unremarkable.  Biopsy of the right breast mass 08/26//2014 showed ([name]) and invasive ductal carcinoma, grade 3, triple negative, with an MIB-1 of 54%. Biopsy of the right axillary lymph node in question at the same time was negative.  Bilateral breast MRI 03/26/2013 measured the right subareolar mass at 3.8 cm. The previously biopsied benign right axillary lymph node was again noted. There were no other suspicious areas in the right breast or axilla and the left breast was unremarkable.  Her subsequent history is as detailed below  INTERVAL HISTORY: Janet [name] returns alone today for followup of her locally advanced right breast cancer. She receives weekly carboplatin/paclitaxel in the neoadjuvant setting. She is due for her 10th dose of chemotherapy tomorrow, 08/28/2013. She does have a history of reduced neutrophils. In fact, her  ninth dose was held 2 weeks ago secondary to afebrile chemotherapy-induced neutropenia. Fortunately she  has had no signs of illness, and denies any fevers or chills.  Interval history is notable for the fact that Janet [name] just got the information that she is BRCA1 positive. She tells me she is dealing with this well, but is more concerned about her 2 daughters, ages 58 and 27. Now that she knows she is BRCA1 positive, she is leaning towards bilateral mastectomies rather than a right lumpectomy.    REVIEW OF SYSTEMS: Janet [name] is really tolerating this chemotherapy well. She has had some thickening and loosening of the nails from the nail beds, but has very little discoloration and no current evidence of drainage or infection. Her skin is dry, but she denies any additional skin changes or rashes.  She bruises easily but has had no signs of abnormal bleeding. Her energy level is good. She continues to exercise regularly and is still working full-time. Her appetite is decreased, but she denies any nausea or change in bowel or bladder habits. She's had no cough, significant shortness of breath, chest pain, or palpitations. She does have a history of migraines, but these are stable and she denies any unusual dizziness or change in vision. She currently denies any unusual myalgias, arthralgias, or bony pain and has had no peripheral swelling. She also denies any problems with peripheral neuropathy in either the upper or lower extremities.   Otherwise a detailed review of systems today was noncontributory   PAST MEDICAL HISTORY: Past Medical History  Diagnosis Date  . Migraines   . Cancer     breast  . Breast cancer   . Allergy   . Anemia, unspecified  07/09/2013  . BRCA1 positive 2015    PAST SURGICAL HISTORY: Past Surgical History  Procedure Laterality Date  . Removal of cataracts    . Foot surgery  age 2    bone graft  . Portacath placement N/A 04/04/2013    Procedure: INSERTION PORT-A-CATH;  Surgeon: Haywood Lasso, MD;  Location: [facility];  Service: General;  Laterality: N/A;    FAMILY  HISTORY Family History  Problem Relation Age of Onset  . Breast cancer Maternal Grandmother 68  . Pancreatic cancer Father 74  . Heart Problems Maternal Uncle   . Ovarian cancer Paternal Aunt     dx in her 47s-60s  . Stomach cancer Other     father's maternal grandmother  . Breast cancer Cousin     Father's maternal cousin's daughter; Alto [place]   the patient's father died at the age of 59 with pancreatic cancer. (He was Dr. Barbee Shropshire). [name] is alive at age 78. [name], no sisters. [name]'s mother died from breast cancer at the age of 34. There is no history of ovarian cancer in the family  GYNECOLOGIC HISTORY:  Menarche age 59, first live birth age 9. She is GX P2. She stopped having periods approximately 2004. She did not take hormone replacement. She did take birth control pills remotely for 5-10 years, with no complications.  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 Her husband the [place]. AQuentin Cornwall "Robby" Gillies, is a local judge. The patient's 2 children are [name] who lives in [place] and works as a Public affairs consultant, and [name], who works in [place] and is an associate with a Interior and spatial designer group.  ADVANCED DIRECTIVES: In place   HEALTH MAINTENANCE: (Updated January 2015) History  Substance Use Topics  . Smoking status: Never Smoker   . Smokeless tobacco: Never Used  . Alcohol Use: Yes     Comment: occasional     Colonoscopy: Not on file  PAP:  Oct 2014, Dr. Laurann Montana  Bone density: July 2013 at [facility],    osteopenia  Lipid panel: UTD, Dr. Laurann Montana   Allergies  Allergen Reactions  . Penicillins   . Azithromycin Rash    Current Outpatient Prescriptions  Medication Sig Dispense  Refill  . acetaminophen (TYLENOL) 325 MG tablet Take 650 mg by mouth every 6 (six) hours as needed for pain.      Marland Kitchen lidocaine-prilocaine (EMLA) cream Apply topically as needed.  30 g  0  . LORazepam (ATIVAN) 0.5 MG tablet Take 1 tablet (0.5 mg total) by mouth at bedtime as needed for anxiety or sleep (or nausea).  30 tablet  0  . Multiple Vitamin (MULTI-VITAMIN DAILY PO) Take by mouth daily.      . Alum & Mag Hydroxide-Simeth (MAGIC MOUTHWASH W/LIDOCAINE) SOLN Take 5 mLs by mouth 4 (four) times daily as needed. For throat or mouth pain  240 mL  1  . prochlorperazine (COMPAZINE) 10 MG tablet Take 1 tablet (10 mg total) by mouth every 6 (six) hours as needed (Nausea or vomiting).  30 tablet  1  . SUMAtriptan (IMITREX) 50 MG tablet Take 1 tablet (50 mg total) by mouth every 2 (two) hours as needed for migraine. May  repeat in 2 hours if headache persists or recurs.  10 tablet  0   No current facility-administered medications for this visit.    OBJECTIVE: Middle-aged white woman who appears comfortable and is in no acute distress  Filed Vitals:   08/27/13 1427  BP: 125/77  Pulse: 74  Temp: 98.3 F (36.8 C)  Resp: 18     Body mass index is 17.77 kg/(m^2).    ECOG FS:0 Filed Weights   08/27/13 1427  Weight: 118 lb 9.6 oz (53.797 kg)   Physical Exam: HEENT:  Sclerae anicteric.  Oropharynx clear and moist. No oral ulcerations or oropharyngeal candidiasis noted.  Neck is supple, trachea midline.  No thyromegaly. NODES:  No cervical or supraclavicular lymphadenopathy palpated.  BREAST EXAM: Palpable abnormality in the central right breast, beneath the areolar complex. The thickened area measures approximately 2-2.5 cm, but is not as firm and solid as when initially examined.  Axillae are benign bilaterally, with no palpable lymphadenopathy. LUNGS:  Clear to auscultation bilaterally with good excursion.  No wheezes, rales, or rhonchi. HEART:  Regular rate and rhythm.  No murmur appreciated.   ABDOMEN:  Soft, thin, nontender to palpation. No organomegaly or masses.  Positive bowel sounds.  MSK:  No focal spinal tenderness to palpation. Good range of motion bilaterally in the upper extremities.   EXTREMITIES:  No peripheral edema.   SKIN:  Skin is benign with no visible rashes or skin lesions. Mild nail dyscrasia noted on the upper and lower extremities, but with no drainage or evidence of infection.  The port is intact in the left upper chest wall with no erythema, edema, or evidence of infection/cellulitis. NEURO:  Nonfocal. Well oriented.  Positive affect.   LAB RESULTS:  CMP     Component Value Date/Time   NA 140 08/20/2013 1023   K 4.1 08/20/2013 1023   CO2 23 08/20/2013 1023   GLUCOSE 95 08/20/2013 1023   BUN 16.5 08/20/2013 1023   CREATININE 0.7 08/20/2013 1023   CALCIUM 10.6* 08/20/2013 1023   PROT 6.4 08/20/2013 1023   ALBUMIN 4.1 08/20/2013 1023   AST 21 08/20/2013 1023   ALT 26 08/20/2013 1023   ALKPHOS 59 08/20/2013 1023   BILITOT 0.44 08/20/2013 1023     Lab Results  Component Value Date   WBC 2.2* 08/27/2013   NEUTROABS 1.1* 08/27/2013   HGB 11.8 08/27/2013   HCT 34.0* 08/27/2013   MCV 97.4 08/27/2013   PLT 114* 08/27/2013      STUDIES:  Mr Breast Bilateral W Wo Contrast  06/05/2013   CLINICAL DATA:  Patient with right breast carcinoma being treated with neoadjuvant chemotherapy. Re-evaluate tumor size.  EXAM: MR BILATERAL BREAST WITHOUT AND WITH CONTRAST  TECHNIQUE: Multiplanar, multisequence MR images of both breasts were obtained prior to and following the intravenous administration of 41m of MultiHance.  THREE-DIMENSIONAL MR IMAGE RENDERING ON INDEPENDENT WORKSTATION:  Three-dimensional MR images were rendered by post-processing of the original MR data on an independent workstation. The three-dimensional MR images were interpreted, and findings are reported in the following complete MRI report for this study.  COMPARISON:  Previous exams  FINDINGS: Breast  composition: d. Extreme fibroglandular tissue  Background parenchymal enhancement: Moderate  Right breast: Very minimal residual punctate enhancement is demonstrated within the subareolar region of the right breast at the site of biopsied right breast malignancy. Scattered foci of enhancement within the right breast.  Left breast: No mass or abnormal enhancement.  Lymph nodes: No abnormal appearing  lymph nodes.  Ancillary findings:  None.  IMPRESSION: Very minimal residual punctate enhancement is demonstrated at the site of previously biopsied right breast malignancy, compatible with treatment response.  RECOMMENDATION: Treatment plan.  BI-RADS CATEGORY  6: Known biopsy-proven malignancy - appropriate action should be taken.   Electronically Signed   By: Lovey Newcomer M.D.   On: 06/05/2013 16:42    ASSESSMENT: 58 y.o. [place] woman   (1)  status post right breast biopsy 03/19/2013 for a clinical T2 N0, stage IIA invasive ductal breast cancer, grade 3, triple negative, with an MIB-1 of 54%.  (2) suspicious right axillary lymph node biopsy negative  (3) received four cycles of dose dense doxorubicin/ cyclophosphamide between 04/17/2013 and 05/29/2013 , at standard doses, with Neulasta support;  (4) on  06/12/2013 starting 12 weekly doses of carboplatin/ paclitaxel.  (A treatment break was given on 07/16/2013 due to the Christmas holiday, with the 12th cycle to be added on at the end.)  (5) anticipates definitive surgery 09/30/2013 (Spring Break week) with Dr. Lucia Gaskins  (6)  BRCA1 +  (7) reduced neutrophils, afebrile  (8)  asymptomatic thrombocytopenia, chemotherapy-induced   PLAN: This case was reviewed with Dr. Jana Hakim who also spoke with the patient today. She will return tomorrow as scheduled, February 4, for her 10th dose of carboplatin/paclitaxel despite a reduced  ANC and mild thrombocytopenia. She is going to have 3 Neupogen injections (Thursday, Friday, and Saturday) following this cycle  in the hopes that we will be able to keep her counts high enough for her to receive her 11th and final scheduled dose of neoadjuvant chemotherapy next week on February 11. (The decision was made not to "make up" her missed cycle in January, and she will receive a total of 11 weekly cycles of carboplatin/paclitaxel accordingly.)  Dr. Jana Hakim spent time speaking with Janet [name] today regarding her BRCA1 positive status. She is scheduled to see Dr. Harlow Mares later this afternoon for consult regarding possible reconstructive surgery since she is now leaning towards bilateral mastectomies. Dr. Lucia Gaskins is also aware of this, and in fact he has already been in contact with her gynecologist office. Janet [name] has already been scheduled for a transvaginal ultrasound and consult in the GYN office to discuss prophylactic surgery.  She's been scheduled to see Dr. Lucia Gaskins again in late February, and is hoping to have her bilateral mastectomies in early March during spring break. She is thinking about having the mastectomies in March, than waiting until her summer break to proceed with the reconstruction and hysterectomy/bilateral salpingo-oophorectomy. She'll discuss this with all of the surgeons, and of course this will all be scheduled per their recommendations.    We are hopeful that with an original mass measuring 3.8 cm by MRI, and with no evidence of lymph node involvement, Janet [name] will be able to avoid postmastectomy radiation. She understands that we will not know this for sure until after her definitive surgery.  Janet [name] voices her understanding and agreement with the above plan and will call with any problems prior to her next scheduled appointment.   Syd Newsome, PA-C   08/27/2013 3:28 PM  ADDENDUM: Janet [name] has been found to be BRCA-1 positive. 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 She has two daughters in their 20's and we discussed testing for  them, which will be quicker and less expensive than the one for the proband (her). Janet [name] understnads all this well. She is nearing the end of her neoadjuvant treatments and already making plans for how to continue to work around the time of surgery.  I personally saw this patient and performed a substantive portion of this encounter with the listed APP documented above.   Chauncey Cruel, MD

## 2013-08-27 NOTE — Telephone Encounter (Signed)
08/27/13 Called Patient and informed her of Dr Zelphia Cairo telephone call with Dr Lucia Gaskins, that  Dr Phineas Real recommends GYN Ultrasound and CA 125 and office visit to discuss with him,  or Dr Toney Rakes. Patient requested Dr Toney Rakes.   Ultrasound,  Labs and Office visit are scheduled for Wed Feb 25 at patients request for  Appointment date.

## 2013-08-28 ENCOUNTER — Other Ambulatory Visit: Payer: Self-pay | Admitting: Gynecology

## 2013-08-28 ENCOUNTER — Ambulatory Visit (HOSPITAL_BASED_OUTPATIENT_CLINIC_OR_DEPARTMENT_OTHER): Payer: BC Managed Care – PPO

## 2013-08-28 ENCOUNTER — Other Ambulatory Visit (INDEPENDENT_AMBULATORY_CARE_PROVIDER_SITE_OTHER): Payer: Self-pay | Admitting: Surgery

## 2013-08-28 VITALS — BP 105/62 | HR 77 | Temp 97.7°F | Resp 18

## 2013-08-28 DIAGNOSIS — C50411 Malignant neoplasm of upper-outer quadrant of right female breast: Secondary | ICD-10-CM

## 2013-08-28 DIAGNOSIS — C50119 Malignant neoplasm of central portion of unspecified female breast: Secondary | ICD-10-CM

## 2013-08-28 DIAGNOSIS — N83339 Acquired atrophy of ovary and fallopian tube, unspecified side: Secondary | ICD-10-CM

## 2013-08-28 DIAGNOSIS — Z853 Personal history of malignant neoplasm of breast: Secondary | ICD-10-CM

## 2013-08-28 DIAGNOSIS — Z5111 Encounter for antineoplastic chemotherapy: Secondary | ICD-10-CM

## 2013-08-28 MED ORDER — SODIUM CHLORIDE 0.9 % IV SOLN
Freq: Once | INTRAVENOUS | Status: AC
  Administered 2013-08-28: 15:00:00 via INTRAVENOUS

## 2013-08-28 MED ORDER — HEPARIN SOD (PORK) LOCK FLUSH 100 UNIT/ML IV SOLN
500.0000 [IU] | Freq: Once | INTRAVENOUS | Status: AC | PRN
  Administered 2013-08-28: 500 [IU]
  Filled 2013-08-28: qty 5

## 2013-08-28 MED ORDER — DIPHENHYDRAMINE HCL 50 MG/ML IJ SOLN
INTRAMUSCULAR | Status: AC
  Filled 2013-08-28: qty 1

## 2013-08-28 MED ORDER — FAMOTIDINE IN NACL 20-0.9 MG/50ML-% IV SOLN
20.0000 mg | Freq: Once | INTRAVENOUS | Status: AC
  Administered 2013-08-28: 20 mg via INTRAVENOUS

## 2013-08-28 MED ORDER — DIPHENHYDRAMINE HCL 50 MG/ML IJ SOLN
25.0000 mg | Freq: Once | INTRAMUSCULAR | Status: AC
  Administered 2013-08-28: 25 mg via INTRAVENOUS

## 2013-08-28 MED ORDER — SODIUM CHLORIDE 0.9 % IJ SOLN
10.0000 mL | INTRAMUSCULAR | Status: DC | PRN
  Administered 2013-08-28: 10 mL
  Filled 2013-08-28: qty 10

## 2013-08-28 MED ORDER — SODIUM CHLORIDE 0.9 % IV SOLN
207.6000 mg | Freq: Once | INTRAVENOUS | Status: AC
  Administered 2013-08-28: 210 mg via INTRAVENOUS
  Filled 2013-08-28: qty 21

## 2013-08-28 MED ORDER — ONDANSETRON 16 MG/50ML IVPB (CHCC)
INTRAVENOUS | Status: AC
Start: 2013-08-28 — End: 2013-08-28
  Filled 2013-08-28: qty 16

## 2013-08-28 MED ORDER — DEXAMETHASONE SODIUM PHOSPHATE 20 MG/5ML IJ SOLN
20.0000 mg | Freq: Once | INTRAMUSCULAR | Status: AC
  Administered 2013-08-28: 20 mg via INTRAVENOUS

## 2013-08-28 MED ORDER — ONDANSETRON 16 MG/50ML IVPB (CHCC)
16.0000 mg | Freq: Once | INTRAVENOUS | Status: AC
  Administered 2013-08-28: 16 mg via INTRAVENOUS

## 2013-08-28 MED ORDER — FAMOTIDINE IN NACL 20-0.9 MG/50ML-% IV SOLN
INTRAVENOUS | Status: AC
  Filled 2013-08-28: qty 50

## 2013-08-28 MED ORDER — DEXAMETHASONE SODIUM PHOSPHATE 20 MG/5ML IJ SOLN
INTRAMUSCULAR | Status: AC
  Filled 2013-08-28: qty 5

## 2013-08-28 MED ORDER — SODIUM CHLORIDE 0.9 % IV SOLN
80.0000 mg/m2 | Freq: Once | INTRAVENOUS | Status: AC
  Administered 2013-08-28: 132 mg via INTRAVENOUS
  Filled 2013-08-28: qty 22

## 2013-08-28 NOTE — Patient Instructions (Signed)
[facility] Discharge Instructions for Patients Receiving Chemotherapy  Today you received the following chemotherapy agents Taxol, Carboplatin.  To help prevent nausea and vomiting after your treatment, we encourage you to take your nausea medication as prescribed.    If you develop nausea and vomiting that is not controlled by your nausea medication, call the clinic.   BELOW ARE SYMPTOMS THAT SHOULD BE REPORTED IMMEDIATELY:  *FEVER GREATER THAN 100.5 F  *CHILLS WITH OR WITHOUT FEVER  NAUSEA AND VOMITING THAT IS NOT CONTROLLED WITH YOUR NAUSEA MEDICATION  *UNUSUAL SHORTNESS OF BREATH  *UNUSUAL BRUISING OR BLEEDING  TENDERNESS IN MOUTH AND THROAT WITH OR WITHOUT PRESENCE OF ULCERS  *URINARY PROBLEMS  *BOWEL PROBLEMS  UNUSUAL RASH Items with * indicate a potential emergency and should be followed up as soon as possible.  Feel free to call the clinic should you have any questions or concerns. The clinic phone number is [phone].  It was such a pleasure to take care of you today!  Thank you!  Leeanne Rio, RN

## 2013-08-29 ENCOUNTER — Other Ambulatory Visit: Payer: Self-pay | Admitting: Physician Assistant

## 2013-08-29 ENCOUNTER — Ambulatory Visit (HOSPITAL_BASED_OUTPATIENT_CLINIC_OR_DEPARTMENT_OTHER): Payer: BC Managed Care – PPO

## 2013-08-29 VITALS — BP 128/81 | HR 82 | Temp 98.3°F

## 2013-08-29 DIAGNOSIS — D696 Thrombocytopenia, unspecified: Secondary | ICD-10-CM

## 2013-08-29 DIAGNOSIS — D702 Other drug-induced agranulocytosis: Secondary | ICD-10-CM

## 2013-08-29 DIAGNOSIS — T451X5A Adverse effect of antineoplastic and immunosuppressive drugs, initial encounter: Secondary | ICD-10-CM

## 2013-08-29 DIAGNOSIS — C50411 Malignant neoplasm of upper-outer quadrant of right female breast: Secondary | ICD-10-CM

## 2013-08-29 MED ORDER — FILGRASTIM 300 MCG/0.5ML IJ SOLN
300.0000 ug | Freq: Once | INTRAMUSCULAR | Status: AC
  Administered 2013-08-29: 300 ug via SUBCUTANEOUS
  Filled 2013-08-29: qty 0.5

## 2013-08-29 NOTE — Patient Instructions (Signed)
Filgrastim, G-CSF injection What is this medicine? FILGRASTIM, G-CSF (fil GRA stim) stimulates the formation of white blood cells. This medicine is given to patients with conditions that may cause a decrease in white blood cells, like those receiving certain types of chemotherapy or bone marrow transplant. It helps the bone marrow recover its ability to produce white blood cells. Increasing the amount of white blood cells helps to decrease the risk of infection and fever. This medicine may be used for other purposes; ask your health care provider or pharmacist if you have questions. COMMON BRAND NAME(S): Neupogen What should I tell my health care provider before I take this medicine? They need to know if you have any of these conditions: -currently receiving radiation therapy -sickle cell disease -an unusual or allergic reaction to filgrastim, E. coli protein, other medicines, foods, dyes, or preservatives -pregnant or trying to get pregnant -breast-feeding How should I use this medicine? This medicine is for injection into a vein or injection under the skin. It is usually given by a health care professional in a hospital or clinic setting. If you get this medicine at home, you will be taught how to prepare and give this medicine. Always change the site for the injection under the skin. Let the solution warm to room temperature before you use it. Do not shake the solution before you withdraw a dose. Throw away any unused portion. Use exactly as directed. Take your medicine at regular intervals. Do not take your medicine more often than directed. It is important that you put your used needles and syringes in a special sharps container. Do not put them in a trash can. If you do not have a sharps container, call your pharmacist or healthcare provider to get one. Talk to your pediatrician regarding the use of this medicine in children. While this medicine may be prescribed for children for selected  conditions, precautions do apply. Overdosage: If you think you have taken too much of this medicine contact a poison control center or emergency room at once. NOTE: This medicine is only for you. Do not share this medicine with others. What if I miss a dose? Try not to miss doses. If you miss a dose take the dose as soon as you remember. If it is almost time for the next dose, do not take double doses unless told to by your doctor or health care professional. What may interact with this medicine? -lithium -medicines for cancer chemotherapy This list may not describe all possible interactions. Give your health care provider a list of all the medicines, herbs, non-prescription drugs, or dietary supplements you use. Also tell them if you smoke, drink alcohol, or use illegal drugs. Some items may interact with your medicine. What should I watch for while using this medicine? Visit your doctor or health care professional for regular checks on your progress. If you get a fever or any sign of infection while you are using this medicine, do not treat yourself. Check with your doctor or health care professional. Bone pain can usually be relieved by mild pain relievers such as acetaminophen or ibuprofen. Check with your doctor or health care professional before taking these medicines as they may hide a fever. Call your doctor or health care professional if the aches and pains are severe or do not go away. What side effects may I notice from receiving this medicine? Side effects that you should report to your doctor or health care professional as soon as possible: -allergic reactions   like skin rash, itching or hives, swelling of the face, lips, or tongue -difficulty breathing, wheezing -fever -pain, redness, or swelling at the injection site -stomach or side pain, or pain at the shoulder Side effects that usually do not require medical attention (report to your doctor or health care professional if they  continue or are bothersome): -bone pain (ribs, lower back, breast bone) -headache -skin rash This list may not describe all possible side effects. Call your doctor for medical advice about side effects. You may report side effects to FDA at 1-800-FDA-1088. Where should I keep my medicine? Keep out of the reach of children. Store in a refrigerator between 2 and 8 degrees C (36 and 46 degrees F). Do not freeze or leave in direct sunlight. If vials or syringes are left out of the refrigerator for more than 24 hours, they must be thrown away. Throw away unused vials after the expiration date on the carton. NOTE: This sheet is a summary. It may not cover all possible information. If you have questions about this medicine, talk to your doctor, pharmacist, or health care provider.  2014, Elsevier/Gold Standard. (2007-09-26 13:33:21)  

## 2013-08-30 ENCOUNTER — Ambulatory Visit (HOSPITAL_BASED_OUTPATIENT_CLINIC_OR_DEPARTMENT_OTHER): Payer: BC Managed Care – PPO

## 2013-08-30 ENCOUNTER — Telehealth: Payer: Self-pay | Admitting: Oncology

## 2013-08-30 VITALS — BP 115/81 | HR 62

## 2013-08-30 DIAGNOSIS — C50411 Malignant neoplasm of upper-outer quadrant of right female breast: Secondary | ICD-10-CM

## 2013-08-30 DIAGNOSIS — C50119 Malignant neoplasm of central portion of unspecified female breast: Secondary | ICD-10-CM

## 2013-08-30 DIAGNOSIS — D702 Other drug-induced agranulocytosis: Secondary | ICD-10-CM

## 2013-08-30 DIAGNOSIS — T451X5A Adverse effect of antineoplastic and immunosuppressive drugs, initial encounter: Secondary | ICD-10-CM

## 2013-08-30 DIAGNOSIS — D696 Thrombocytopenia, unspecified: Secondary | ICD-10-CM

## 2013-08-30 MED ORDER — FILGRASTIM 300 MCG/0.5ML IJ SOLN
300.0000 ug | Freq: Once | INTRAMUSCULAR | Status: AC
  Administered 2013-08-30: 300 ug via SUBCUTANEOUS
  Filled 2013-08-30: qty 0.5

## 2013-08-30 NOTE — Telephone Encounter (Signed)
s.wMorey Hummingbird in [facility] and got MRI cx per pof

## 2013-08-31 ENCOUNTER — Ambulatory Visit (HOSPITAL_BASED_OUTPATIENT_CLINIC_OR_DEPARTMENT_OTHER): Payer: BC Managed Care – PPO

## 2013-08-31 DIAGNOSIS — D696 Thrombocytopenia, unspecified: Secondary | ICD-10-CM

## 2013-08-31 DIAGNOSIS — D702 Other drug-induced agranulocytosis: Secondary | ICD-10-CM

## 2013-08-31 DIAGNOSIS — C50411 Malignant neoplasm of upper-outer quadrant of right female breast: Secondary | ICD-10-CM

## 2013-08-31 MED ORDER — FILGRASTIM 300 MCG/0.5ML IJ SOLN
300.0000 ug | Freq: Once | INTRAMUSCULAR | Status: AC
  Administered 2013-08-31: 300 ug via SUBCUTANEOUS

## 2013-09-03 ENCOUNTER — Ambulatory Visit (HOSPITAL_BASED_OUTPATIENT_CLINIC_OR_DEPARTMENT_OTHER): Payer: BC Managed Care – PPO | Admitting: Physician Assistant

## 2013-09-03 ENCOUNTER — Other Ambulatory Visit: Payer: Self-pay | Admitting: Physician Assistant

## 2013-09-03 ENCOUNTER — Other Ambulatory Visit (HOSPITAL_BASED_OUTPATIENT_CLINIC_OR_DEPARTMENT_OTHER): Payer: BC Managed Care – PPO

## 2013-09-03 ENCOUNTER — Other Ambulatory Visit: Payer: BC Managed Care – PPO

## 2013-09-03 ENCOUNTER — Encounter: Payer: Self-pay | Admitting: Physician Assistant

## 2013-09-03 ENCOUNTER — Ambulatory Visit: Payer: BC Managed Care – PPO | Admitting: Physician Assistant

## 2013-09-03 VITALS — BP 116/77 | HR 78 | Temp 98.5°F | Resp 18 | Ht 68.5 in | Wt 117.0 lb

## 2013-09-03 DIAGNOSIS — Z171 Estrogen receptor negative status [ER-]: Secondary | ICD-10-CM

## 2013-09-03 DIAGNOSIS — C50919 Malignant neoplasm of unspecified site of unspecified female breast: Secondary | ICD-10-CM

## 2013-09-03 DIAGNOSIS — C50411 Malignant neoplasm of upper-outer quadrant of right female breast: Secondary | ICD-10-CM

## 2013-09-03 DIAGNOSIS — Z1509 Genetic susceptibility to other malignant neoplasm: Secondary | ICD-10-CM

## 2013-09-03 DIAGNOSIS — Z1501 Genetic susceptibility to malignant neoplasm of breast: Secondary | ICD-10-CM

## 2013-09-03 DIAGNOSIS — D696 Thrombocytopenia, unspecified: Secondary | ICD-10-CM

## 2013-09-03 DIAGNOSIS — C50119 Malignant neoplasm of central portion of unspecified female breast: Secondary | ICD-10-CM

## 2013-09-03 DIAGNOSIS — C50019 Malignant neoplasm of nipple and areola, unspecified female breast: Secondary | ICD-10-CM

## 2013-09-03 LAB — COMPREHENSIVE METABOLIC PANEL (CC13)
ALK PHOS: 77 U/L (ref 40–150)
ALT: 41 U/L (ref 0–55)
AST: 24 U/L (ref 5–34)
Albumin: 4.4 g/dL (ref 3.5–5.0)
Anion Gap: 7 mEq/L (ref 3–11)
BILIRUBIN TOTAL: 0.4 mg/dL (ref 0.20–1.20)
BUN: 14 mg/dL (ref 7.0–26.0)
CO2: 26 mEq/L (ref 22–29)
CREATININE: 0.8 mg/dL (ref 0.6–1.1)
Calcium: 10.8 mg/dL — ABNORMAL HIGH (ref 8.4–10.4)
Chloride: 105 mEq/L (ref 98–109)
Glucose: 93 mg/dl (ref 70–140)
Potassium: 4.4 mEq/L (ref 3.5–5.1)
Sodium: 139 mEq/L (ref 136–145)
Total Protein: 6.5 g/dL (ref 6.4–8.3)

## 2013-09-03 LAB — CBC WITH DIFFERENTIAL/PLATELET
BASO%: 0.3 % (ref 0.0–2.0)
Basophils Absolute: 0 10*3/uL (ref 0.0–0.1)
EOS%: 0.3 % (ref 0.0–7.0)
Eosinophils Absolute: 0 10*3/uL (ref 0.0–0.5)
HCT: 36.2 % (ref 34.8–46.6)
HEMOGLOBIN: 12.5 g/dL (ref 11.6–15.9)
LYMPH%: 27.3 % (ref 14.0–49.7)
MCH: 33.9 pg (ref 25.1–34.0)
MCHC: 34.5 g/dL (ref 31.5–36.0)
MCV: 98.1 fL (ref 79.5–101.0)
MONO#: 0.4 10*3/uL (ref 0.1–0.9)
MONO%: 13.3 % (ref 0.0–14.0)
NEUT#: 1.9 10*3/uL (ref 1.5–6.5)
NEUT%: 58.8 % (ref 38.4–76.8)
Platelets: 132 10*3/uL — ABNORMAL LOW (ref 145–400)
RBC: 3.69 10*6/uL — ABNORMAL LOW (ref 3.70–5.45)
RDW: 12.8 % (ref 11.2–14.5)
WBC: 3.3 10*3/uL — ABNORMAL LOW (ref 3.9–10.3)
lymph#: 0.9 10*3/uL (ref 0.9–3.3)

## 2013-09-03 NOTE — Progress Notes (Signed)
Patient ID: Janet [name], female   DOB: Feb 22, 1956, 58 y.o.   MRN: [id number] ID: [name] OB: Aug 07, 1955  MR#: [id number]  YQM#:[id number]  PCP: Osborne Casco, MD GYN:  Dr. Uvaldo Rising;  Dr. Donalynn Furlong SU: Osborn Coho; [name] OTHER MD: Arloa Koh, Juanito Doom;  Crissie Reese, MD  CHIEF COMPLAINTS:   (1) Right Breast Cancer/Neoadjuvant Chemo       (2)  BRCA1 positive   HISTORY OF PRESENT ILLNESS: "Janet [name]" herself noted a lump in her right breast on 03/17/2013, and immediately contacted her physician so that on 03/18/2013 bilateral diagnostic mammography and right breast ultrasonography at the breast Center showed a developing mass in the subareolar region of the right breast. There were no malignant type calcifications. This mass was palpable in the upper outer quadrant, and by ultrasound measured 2.6 cm. Ultrasonography of the right axilla showed a lymph node measuring 1.1 cm, with a thickened cortex. The left breast was unremarkable.  Biopsy of the right breast mass 08/26//2014 showed ([name]) and invasive ductal carcinoma, grade 3, triple negative, with an MIB-1 of 54%. Biopsy of the right axillary lymph node in question at the same time was negative.  Bilateral breast MRI 03/26/2013 measured the right subareolar mass at 3.8 cm. The previously biopsied benign right axillary lymph node was again noted. There were no other suspicious areas in the right breast or axilla and the left breast was unremarkable.  Her subsequent history is as detailed below  INTERVAL HISTORY: Janet [name] returns alone today for followup of her locally advanced right breast cancer. She continues to receive weekly carboplatin/paclitaxel in the neoadjuvant setting, and is due for her 11th and final weekly dose tomorrow, 09/04/2013. She is  status post 3 injections of Neupogen following cycle 10 last week, and her neutrophils have recovered nicely, with an ANC of 1.9 today. She's had no  fevers or chills. She tolerated the injections well with no bony aches and pains. The only side effects she is having from the chemotherapy itself are some continued nailbed changes and sensitivity.  She continues to deny neuropathy.  Since finding out her BRCA 1 positive status, Janet [name] has been getting all of her appointments and her upcoming surgeries coordinated.  She scheduled for her bilateral mastectomies with Dr. Lucia Gaskins on 09/30/2013 and would like to have her port removed at the same time. She has spoken with Dr. Harlow Mares regarding reconstruction, hopefully at the time of her mastectomy. She also has an appointment in a couple of weeks with her gynecologist for further evaluation and discussion of a hysterectomy/bilateral salpingo-oophorectomy.   REVIEW OF SYSTEMS: Janet [name] continues to feel remarkably well, and continues to work full-time. She's had no fevers or chills. She denies any rashes or skin changes other than the nailbed changes noted above. She's had no abnormal bruising or bleeding other than some blood in the mucus when she blows her nose. She's eating and drinking well. She denies any nausea or change in bowel or bladder habits. She's had no increased cough, shortness of breath, peripheral swelling, chest pain, or palpitations. She's had no recent headaches, although she does have a history of migraines. She currently denies any unusual myalgias, arthralgias, or bony pain.  A detailed review of systems is otherwise stable and noncontributory.    PAST MEDICAL HISTORY: Past Medical History  Diagnosis Date  . Migraines   . Cancer     breast  . Breast cancer   . Allergy   . Anemia,  unspecified 07/09/2013  . BRCA1 positive 2015    PAST SURGICAL HISTORY: Past Surgical History  Procedure Laterality Date  . Removal of cataracts    . Foot surgery  age 48    bone graft  . Portacath placement N/A 04/04/2013    Procedure: INSERTION PORT-A-CATH;  Surgeon: Haywood Lasso, MD;   Location: [facility];  Service: General;  Laterality: N/A;    FAMILY HISTORY Family History  Problem Relation Age of Onset  . Breast cancer Maternal Grandmother 68  . Pancreatic cancer Father 49  . Heart Problems Maternal Uncle   . Ovarian cancer Paternal Aunt     dx in her 94s-60s  . Stomach cancer Other     father's maternal grandmother  . Breast cancer Cousin     Father's maternal cousin's daughter; Alto Denver   the patient's father died at the age of 72 with pancreatic cancer. (He was Dr. Barbee Shropshire). [name] is alive at age 60. [name], no sisters. [name]'s mother died from breast cancer at the age of 48. There is no history of ovarian cancer in the family  GYNECOLOGIC HISTORY:  Menarche age 62, first live birth age 2. She is GX P2. She stopped having periods approximately 2004. She did not take hormone replacement. She did take birth control pills remotely for 5-10 years, with no complications.  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 Her husband the [place]. AQuentin Cornwall "Robby" Dhillon, is a local judge. The patient's 2 children are [name] who lives in [place] and works as a Public affairs consultant, and [name], who works in [place] and is an associate with a Interior and spatial designer group.   ADVANCED DIRECTIVES: In place.  Patient was also given information about advanced directives today so that she may update her healthcare power of attorney. (09/03/2013)   HEALTH MAINTENANCE: (Updated February 2015) History  Substance Use Topics  . Smoking status: Never Smoker   . Smokeless tobacco: Never Used  . Alcohol Use: Yes     Comment: occasional     Colonoscopy: Not on file  PAP:  Oct 2014, Dr. Laurann Montana  Bone density: July 2013  at [facility],    osteopenia  Lipid panel: UTD, Dr. Laurann Montana   Allergies  Allergen Reactions  . Penicillins   . Azithromycin Rash    Current Outpatient Prescriptions  Medication Sig Dispense Refill  . acetaminophen (TYLENOL) 325 MG tablet Take 650 mg by mouth every 6 (six) hours as needed for pain.      Marland Kitchen lidocaine-prilocaine (EMLA) cream Apply topically as needed.  30 g  0  . LORazepam (ATIVAN) 0.5 MG tablet Take 1 tablet (0.5 mg total) by mouth at bedtime as needed for anxiety or sleep (or nausea).  30 tablet  0  . Multiple Vitamin (MULTI-VITAMIN DAILY PO) Take by mouth daily.      . Alum & Mag Hydroxide-Simeth (MAGIC MOUTHWASH W/LIDOCAINE) SOLN Take 5 mLs by mouth 4 (four) times daily as needed. For throat or mouth pain  240 mL  1  . prochlorperazine (COMPAZINE) 10 MG tablet Take 1 tablet (10 mg total) by mouth every 6 (six) hours as needed (Nausea or vomiting).  30 tablet  1  .  SUMAtriptan (IMITREX) 50 MG tablet Take 1 tablet (50 mg total) by mouth every 2 (two) hours as needed for migraine. May repeat in 2 hours if headache persists or recurs.  10 tablet  0   No current facility-administered medications for this visit.    OBJECTIVE: Middle-aged white woman who appears comfortable and is in no acute distress  Filed Vitals:   09/03/13 1127  BP: 116/77  Pulse: 78  Temp: 98.5 F (36.9 C)  Resp: 18     Body mass index is 17.53 kg/(m^2).    ECOG FS:0 Filed Weights   09/03/13 1127  Weight: 117 lb (53.071 kg)   Physical Exam: HEENT:  Sclerae anicteric.  Oropharynx clear, pink, and moist. No oral ulcerations or oropharyngeal candidiasis noted.  Neck is supple, trachea midline.  No thyromegaly. NODES:  No cervical or supraclavicular lymphadenopathy palpated.  BREAST EXAM: Deferred.  Axillae are benign bilaterally, with no palpable lymphadenopathy. LUNGS:  Clear to auscultation bilaterally.  No wheezes, rales, or rhonchi. HEART:  Regular rate and rhythm.  No murmur  appreciated.  ABDOMEN:  Soft, thin, nontender. No organomegaly or masses palpated.  Positive bowel sounds.  MSK:  No focal spinal tenderness to palpation. Good range of motion bilaterally in the upper extremities.   EXTREMITIES:  No peripheral edema.   SKIN:  Skin is benign with no visible rashes or skin lesions. Nail dyscrasia noted on the upper and lower extremities with hyperpigmentation, thickening of the nail, and loosening of the toenails,  but with no active drainage or evidence of infection.  The port is intact in the left upper chest wall with no erythema, edema, or evidence of infection/cellulitis. NEURO:  Nonfocal. Well oriented.  Positive affect.   LAB RESULTS:  CMP     Component Value Date/Time   NA 139 09/03/2013 1116   K 4.4 09/03/2013 1116   CO2 26 09/03/2013 1116   GLUCOSE 93 09/03/2013 1116   BUN 14.0 09/03/2013 1116   CREATININE 0.8 09/03/2013 1116   CALCIUM 10.8* 09/03/2013 1116   PROT 6.5 09/03/2013 1116   ALBUMIN 4.4 09/03/2013 1116   AST 24 09/03/2013 1116   ALT 41 09/03/2013 1116   ALKPHOS 77 09/03/2013 1116   BILITOT 0.40 09/03/2013 1116     Lab Results  Component Value Date   WBC 3.3* 09/03/2013   NEUTROABS 1.9 09/03/2013   HGB 12.5 09/03/2013   HCT 36.2 09/03/2013   MCV 98.1 09/03/2013   PLT 132* 09/03/2013      STUDIES:  Mr Breast Bilateral W Wo Contrast  06/05/2013   CLINICAL DATA:  Patient with right breast carcinoma being treated with neoadjuvant chemotherapy. Re-evaluate tumor size.  EXAM: MR BILATERAL BREAST WITHOUT AND WITH CONTRAST  TECHNIQUE: Multiplanar, multisequence MR images of both breasts were obtained prior to and following the intravenous administration of 70m of MultiHance.  THREE-DIMENSIONAL MR IMAGE RENDERING ON INDEPENDENT WORKSTATION:  Three-dimensional MR images were rendered by post-processing of the original MR data on an independent workstation. The three-dimensional MR images were interpreted, and findings are reported in the following  complete MRI report for this study.  COMPARISON:  Previous exams  FINDINGS: Breast composition: d. Extreme fibroglandular tissue  Background parenchymal enhancement: Moderate  Right breast: Very minimal residual punctate enhancement is demonstrated within the subareolar region of the right breast at the site of biopsied right breast malignancy. Scattered foci of enhancement within the right breast.  Left breast: No mass or abnormal enhancement.  Lymph nodes: No abnormal  appearing lymph nodes.  Ancillary findings:  None.  IMPRESSION: Very minimal residual punctate enhancement is demonstrated at the site of previously biopsied right breast malignancy, compatible with treatment response.  RECOMMENDATION: Treatment plan.  BI-RADS CATEGORY  6: Known biopsy-proven malignancy - appropriate action should be taken.   Electronically Signed   By: Lovey Newcomer M.D.   On: 06/05/2013 16:42    ASSESSMENT: 58 y.o. [place] woman   (1)  status post right breast biopsy 03/19/2013 for a clinical T2 N0, stage IIA invasive ductal breast cancer, grade 3, triple negative, with an MIB-1 of 54%.  (2) suspicious right axillary lymph node biopsy negative  (3) received four cycles of dose dense doxorubicin/ cyclophosphamide between 04/17/2013 and 05/29/2013 , at standard doses, with Neulasta support;  (4) on  06/12/2013, started 12 weekly doses of carboplatin/ paclitaxel.  One treatment was held in January secondary to chemotherapy-induced neutropenia, and that cycle will not be "made up". Accordingly, patient is receiving a total of 11 weekly doses, with final dose on 09/04/2013.  (5) anticipates definitive surgery 09/30/2013 (Spring Break week) with Dr. Lucia Gaskins, bilateral mastectomies  (6)  BRCA1 positive  (7)  asymptomatic thrombocytopenia, chemotherapy-induced   PLAN: Janet [name] will return tomorrow for her 11th and final dose of neoadjuvant carboplatin/paclitaxel as scheduled. Since she is having bilateral mastectomies,  we are not obtaining a final breast MRI per Dr. Lucia Gaskins. Janet [name] will return to see Dr. Jana Hakim in 2 weeks for routine followup. As noted above, she is already scheduled to see her gynecologist, Dr. Toney Rakes, and will hopefully proceed to surgery with Dr. Lucia Gaskins in early March.   All this has been reviewed thoroughly with Janet [name] today, and she voices understanding and agreement with our plan. As always, she knows to call with any changes or problems prior to her next scheduled appointment.  Micah Flesher, PA-C 09/03/2013

## 2013-09-04 ENCOUNTER — Ambulatory Visit (HOSPITAL_BASED_OUTPATIENT_CLINIC_OR_DEPARTMENT_OTHER): Payer: BC Managed Care – PPO

## 2013-09-04 ENCOUNTER — Encounter: Payer: Self-pay | Admitting: Physician Assistant

## 2013-09-04 VITALS — BP 123/59 | HR 64 | Temp 98.0°F | Resp 18

## 2013-09-04 DIAGNOSIS — C50411 Malignant neoplasm of upper-outer quadrant of right female breast: Secondary | ICD-10-CM

## 2013-09-04 DIAGNOSIS — Z5111 Encounter for antineoplastic chemotherapy: Secondary | ICD-10-CM

## 2013-09-04 DIAGNOSIS — C50119 Malignant neoplasm of central portion of unspecified female breast: Secondary | ICD-10-CM

## 2013-09-04 MED ORDER — FAMOTIDINE IN NACL 20-0.9 MG/50ML-% IV SOLN
20.0000 mg | Freq: Once | INTRAVENOUS | Status: AC
  Administered 2013-09-04: 20 mg via INTRAVENOUS

## 2013-09-04 MED ORDER — SODIUM CHLORIDE 0.9 % IJ SOLN
10.0000 mL | INTRAMUSCULAR | Status: DC | PRN
  Administered 2013-09-04: 10 mL
  Filled 2013-09-04: qty 10

## 2013-09-04 MED ORDER — HEPARIN SOD (PORK) LOCK FLUSH 100 UNIT/ML IV SOLN
500.0000 [IU] | Freq: Once | INTRAVENOUS | Status: AC | PRN
  Administered 2013-09-04: 500 [IU]
  Filled 2013-09-04: qty 5

## 2013-09-04 MED ORDER — SODIUM CHLORIDE 0.9 % IV SOLN
80.0000 mg/m2 | Freq: Once | INTRAVENOUS | Status: AC
  Administered 2013-09-04: 132 mg via INTRAVENOUS
  Filled 2013-09-04: qty 22

## 2013-09-04 MED ORDER — ONDANSETRON 16 MG/50ML IVPB (CHCC)
INTRAVENOUS | Status: AC
Start: 2013-09-04 — End: 2013-09-04
  Filled 2013-09-04: qty 16

## 2013-09-04 MED ORDER — DIPHENHYDRAMINE HCL 50 MG/ML IJ SOLN
25.0000 mg | Freq: Once | INTRAMUSCULAR | Status: AC
  Administered 2013-09-04: 25 mg via INTRAVENOUS

## 2013-09-04 MED ORDER — SODIUM CHLORIDE 0.9 % IV SOLN
Freq: Once | INTRAVENOUS | Status: AC
  Administered 2013-09-04: 14:00:00 via INTRAVENOUS

## 2013-09-04 MED ORDER — DIPHENHYDRAMINE HCL 50 MG/ML IJ SOLN
INTRAMUSCULAR | Status: AC
  Filled 2013-09-04: qty 1

## 2013-09-04 MED ORDER — ONDANSETRON 16 MG/50ML IVPB (CHCC)
16.0000 mg | Freq: Once | INTRAVENOUS | Status: AC
  Administered 2013-09-04: 16 mg via INTRAVENOUS

## 2013-09-04 MED ORDER — SODIUM CHLORIDE 0.9 % IV SOLN
207.6000 mg | Freq: Once | INTRAVENOUS | Status: AC
  Administered 2013-09-04: 210 mg via INTRAVENOUS
  Filled 2013-09-04: qty 21

## 2013-09-04 MED ORDER — DEXAMETHASONE SODIUM PHOSPHATE 20 MG/5ML IJ SOLN
INTRAMUSCULAR | Status: AC
  Filled 2013-09-04: qty 5

## 2013-09-04 MED ORDER — FAMOTIDINE IN NACL 20-0.9 MG/50ML-% IV SOLN
INTRAVENOUS | Status: AC
  Filled 2013-09-04: qty 50

## 2013-09-04 MED ORDER — DEXAMETHASONE SODIUM PHOSPHATE 20 MG/5ML IJ SOLN
20.0000 mg | Freq: Once | INTRAMUSCULAR | Status: AC
  Administered 2013-09-04: 20 mg via INTRAVENOUS

## 2013-09-04 NOTE — Progress Notes (Signed)
Discharged at 35 with spouse and mother.  Ambulatory in no distress.

## 2013-09-04 NOTE — Patient Instructions (Addendum)
[facility] Discharge Instructions for Patients Receiving Chemotherapy  Today you received the following chemotherapy agents Taxol/Carboplatin To help prevent nausea and vomiting after your treatment, we encourage you to take your nausea medication compazine 10 mg every 6 hours or 30 minutes before meals and at bedtime as prescribed.  If you develop nausea and vomiting that is not controlled by your nausea medication, call the clinic.   BELOW ARE SYMPTOMS THAT SHOULD BE REPORTED IMMEDIATELY:  *FEVER GREATER THAN 100.5 F  *CHILLS WITH OR WITHOUT FEVER  NAUSEA AND VOMITING THAT IS NOT CONTROLLED WITH YOUR NAUSEA MEDICATION  *UNUSUAL SHORTNESS OF BREATH  *UNUSUAL BRUISING OR BLEEDING  TENDERNESS IN MOUTH AND THROAT WITH OR WITHOUT PRESENCE OF ULCERS  *URINARY PROBLEMS  *BOWEL PROBLEMS  UNUSUAL RASH Items with * indicate a potential emergency and should be followed up as soon as possible.  Feel free to call the clinic you have any questions or concerns. The clinic phone number is [phone].

## 2013-09-10 ENCOUNTER — Ambulatory Visit (HOSPITAL_COMMUNITY): Payer: BC Managed Care – PPO

## 2013-09-11 ENCOUNTER — Other Ambulatory Visit: Payer: BC Managed Care – PPO

## 2013-09-11 ENCOUNTER — Ambulatory Visit: Payer: BC Managed Care – PPO

## 2013-09-17 ENCOUNTER — Other Ambulatory Visit (HOSPITAL_BASED_OUTPATIENT_CLINIC_OR_DEPARTMENT_OTHER): Payer: BC Managed Care – PPO

## 2013-09-17 ENCOUNTER — Other Ambulatory Visit: Payer: BC Managed Care – PPO

## 2013-09-17 ENCOUNTER — Telehealth: Payer: Self-pay | Admitting: Oncology

## 2013-09-17 ENCOUNTER — Ambulatory Visit: Payer: BC Managed Care – PPO | Admitting: Oncology

## 2013-09-17 ENCOUNTER — Ambulatory Visit (HOSPITAL_BASED_OUTPATIENT_CLINIC_OR_DEPARTMENT_OTHER): Payer: BC Managed Care – PPO | Admitting: Oncology

## 2013-09-17 VITALS — BP 130/77 | HR 59 | Temp 97.5°F | Resp 18 | Ht 68.5 in | Wt 117.2 lb

## 2013-09-17 DIAGNOSIS — C50119 Malignant neoplasm of central portion of unspecified female breast: Secondary | ICD-10-CM

## 2013-09-17 DIAGNOSIS — R439 Unspecified disturbances of smell and taste: Secondary | ICD-10-CM

## 2013-09-17 DIAGNOSIS — C50411 Malignant neoplasm of upper-outer quadrant of right female breast: Secondary | ICD-10-CM

## 2013-09-17 DIAGNOSIS — D649 Anemia, unspecified: Secondary | ICD-10-CM

## 2013-09-17 DIAGNOSIS — Z171 Estrogen receptor negative status [ER-]: Secondary | ICD-10-CM

## 2013-09-17 DIAGNOSIS — D709 Neutropenia, unspecified: Secondary | ICD-10-CM

## 2013-09-17 DIAGNOSIS — G43909 Migraine, unspecified, not intractable, without status migrainosus: Secondary | ICD-10-CM

## 2013-09-17 DIAGNOSIS — Z1509 Genetic susceptibility to other malignant neoplasm: Secondary | ICD-10-CM

## 2013-09-17 DIAGNOSIS — D696 Thrombocytopenia, unspecified: Secondary | ICD-10-CM

## 2013-09-17 DIAGNOSIS — Z1501 Genetic susceptibility to malignant neoplasm of breast: Secondary | ICD-10-CM

## 2013-09-17 LAB — CBC WITH DIFFERENTIAL/PLATELET
BASO%: 1.1 % (ref 0.0–2.0)
BASOS ABS: 0 10*3/uL (ref 0.0–0.1)
EOS%: 0.9 % (ref 0.0–7.0)
Eosinophils Absolute: 0 10*3/uL (ref 0.0–0.5)
HEMATOCRIT: 36.7 % (ref 34.8–46.6)
HGB: 12.5 g/dL (ref 11.6–15.9)
LYMPH%: 32.8 % (ref 14.0–49.7)
MCH: 34.6 pg — AB (ref 25.1–34.0)
MCHC: 33.9 g/dL (ref 31.5–36.0)
MCV: 101.9 fL — ABNORMAL HIGH (ref 79.5–101.0)
MONO#: 0.3 10*3/uL (ref 0.1–0.9)
MONO%: 14.4 % — ABNORMAL HIGH (ref 0.0–14.0)
NEUT#: 1.2 10*3/uL — ABNORMAL LOW (ref 1.5–6.5)
NEUT%: 50.8 % (ref 38.4–76.8)
Platelets: 170 10*3/uL (ref 145–400)
RBC: 3.6 10*6/uL — ABNORMAL LOW (ref 3.70–5.45)
RDW: 14.2 % (ref 11.2–14.5)
WBC: 2.3 10*3/uL — ABNORMAL LOW (ref 3.9–10.3)
lymph#: 0.8 10*3/uL — ABNORMAL LOW (ref 0.9–3.3)

## 2013-09-17 LAB — COMPREHENSIVE METABOLIC PANEL (CC13)
ALBUMIN: 4.4 g/dL (ref 3.5–5.0)
ALT: 25 U/L (ref 0–55)
AST: 22 U/L (ref 5–34)
Alkaline Phosphatase: 63 U/L (ref 40–150)
Anion Gap: 9 mEq/L (ref 3–11)
BUN: 15 mg/dL (ref 7.0–26.0)
CALCIUM: 10.4 mg/dL (ref 8.4–10.4)
CO2: 24 meq/L (ref 22–29)
CREATININE: 0.7 mg/dL (ref 0.6–1.1)
Chloride: 108 mEq/L (ref 98–109)
GLUCOSE: 89 mg/dL (ref 70–140)
POTASSIUM: 3.6 meq/L (ref 3.5–5.1)
Sodium: 141 mEq/L (ref 136–145)
Total Bilirubin: 0.52 mg/dL (ref 0.20–1.20)
Total Protein: 6.8 g/dL (ref 6.4–8.3)

## 2013-09-17 NOTE — Telephone Encounter (Signed)
, °

## 2013-09-17 NOTE — Progress Notes (Signed)
Patient ID: Janet [name], female   DOB: 1955/11/13, 58 y.o.   MRN: [id number] ID: Janet [name] OB: 03-22-1956  MR#: [id number]  YQM#:[id number]  PCP: Janet Casco, MD GYN:  Dr. Uvaldo [name];  Dr. Donalynn [name] SU: Janet [name]; Janet [name] OTHER MD: Janet [name], Janet [name];  Janet Reese, MD  CHIEF COMPLAINTS:   (1) Right Breast Cancer/Neoadjuvant Chemo       (2)  BRCA1 positive   HISTORY OF PRESENT ILLNESS: "Janet [name]" herself noted a lump in her right breast on 03/17/2013, and immediately contacted her physician so that on 03/18/2013 bilateral diagnostic mammography and right breast ultrasonography at the breast Center showed a developing mass in the subareolar region of the right breast. There were no malignant type calcifications. This mass was palpable in the upper outer quadrant, and by ultrasound measured 2.6 cm. Ultrasonography of the right axilla showed a lymph node measuring 1.1 cm, with a thickened cortex. The left breast was unremarkable.  Biopsy of the right breast mass 08/26//2014 showed ([name]) and invasive ductal carcinoma, grade 3, triple negative, with an MIB-1 of 54%. Biopsy of the right axillary lymph node in question at the same time was negative.  Bilateral breast MRI 03/26/2013 measured the right subareolar mass at 3.8 cm. The previously biopsied benign right axillary lymph node was again noted. There were no other suspicious areas in the right breast or axilla and the left breast was unremarkable.  Her subsequent history is as detailed below  INTERVAL HISTORY: Janet [name] returns alone today for followup of her locally advanced right breast cancer. She has completed her chemotherapy, which will be her only type of systemic treatment. She did remarkably well with the therapy, continuing to work full-time and to run every day. When asked what the heart is part of her chemotherapy was she says "none of it was hard". More importantly, since her last  visit here she has been found to be BRCA positive. Accordingly she is now planning on bilateral mastectomies is that of lumpectomy and sentinel lymph node sampling.  REVIEW OF SYSTEMS: Janet [name] lost a total of 5 pounds over the chemotherapy period. Her taste is altered and she is making an effort to find things that she enjoys eating, but understands it will be a few months before her taste perversion subsides. We went over the period of time it takes for her nails and toenails to fully recover. Similarly with hair growth, dry skin, and "dry joints". [name] though a detailed review of systems today was negative except as noted  PAST MEDICAL HISTORY: Past Medical History  Diagnosis Date  . Migraines   . Cancer     breast  . Breast cancer   . Allergy   . Anemia, unspecified 07/09/2013  . BRCA1 positive 2015    PAST SURGICAL HISTORY: Past Surgical History  Procedure Laterality Date  . Removal of cataracts    . Foot surgery  age 57    bone graft  . Portacath placement N/A 04/04/2013    Procedure: INSERTION PORT-A-CATH;  Surgeon: Janet Lasso, MD;  Location: [facility];  Service: General;  Laterality: N/A;    FAMILY HISTORY Family History  Problem Relation Age of Onset  . Breast cancer Maternal Grandmother 68  . Pancreatic cancer Father 66  . Heart Problems Maternal Uncle   . Ovarian cancer Paternal Aunt     dx in her 64s-60s  . Stomach cancer Other     father's maternal grandmother  . Breast  cancer Cousin     Father's maternal cousin's daughter; Janet [name]   the patient's father died at the age of 66 with pancreatic cancer. (He was Dr. Barbee Shropshire). [name] is alive at age 69. Janet [name] has one brother, no sisters. [name]'s mother died from breast cancer at the age of 73. There is no history of ovarian cancer in the family  GYNECOLOGIC HISTORY:  Menarche age 46, first live birth age 26. She is GX P2. She stopped having periods approximately 2004. She did  not take hormone replacement. She did take birth control pills remotely for 5-10 years, with no complications.  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 Her husband the [place]. Janet [name], is a local judge. The patient's 2 children are Janet [name] who lives in [place] and works as a Public affairs consultant, and Janet [name], who works in [place] and is an associate with a Interior and spatial designer group.   ADVANCED DIRECTIVES: In place.  Patient was also given information about advanced directives today so that she may update her healthcare power of attorney. (09/03/2013)   HEALTH MAINTENANCE: (Updated February 2015) History  Substance Use Topics  . Smoking status: Never Smoker   . Smokeless tobacco: Never Used  . Alcohol Use: Yes     Comment: occasional     Colonoscopy: Not on file  PAP:  Oct 2014, Dr. Laurann [name]  Bone density: July 2013 at [facility],    osteopenia  Lipid panel: UTD, Dr. Laurann [name]   Allergies  Allergen Reactions  . Penicillins   . Azithromycin Rash    Current Outpatient Prescriptions  Medication Sig Dispense Refill  . acetaminophen (TYLENOL) 325 MG tablet Take 650 mg by mouth every 6 (six) hours as needed for pain.      Marland Kitchen Alum & Mag Hydroxide-Simeth (MAGIC MOUTHWASH W/LIDOCAINE) SOLN Take 5 mLs by mouth 4 (four) times daily as needed. For throat or mouth pain  240 mL  1  . lidocaine-prilocaine (EMLA) cream Apply topically as needed.  30 g  0  . LORazepam (ATIVAN) 0.5 MG tablet Take 1 tablet (0.5 mg total) by mouth at bedtime as needed for anxiety or sleep (or nausea).  30 tablet  0  . Multiple Vitamin (MULTI-VITAMIN DAILY PO) Take by mouth daily.      . prochlorperazine (COMPAZINE) 10 MG tablet Take 1 tablet (10 mg total) by mouth every 6 (six)  hours as needed (Nausea or vomiting).  30 tablet  1  . SUMAtriptan (IMITREX) 50 MG tablet Take 1 tablet (50 mg total) by mouth every 2 (two) hours as needed for migraine. May repeat in 2 hours if headache persists or recurs.  10 tablet  0   No current facility-administered medications for this visit.   Facility-Administered Medications Ordered in Other Visits  Medication Dose Route Frequency Provider Last Rate Last Dose  . sodium chloride 0.9 % injection 10 mL  10 mL Intracatheter PRN Theotis Burrow, PA-C   10 mL at 09/04/13 1652    OBJECTIVE: Middle-aged white woman who appears stated age 46 Vitals:   09/17/13 1340  BP: 130/77  Pulse: 59  Temp: 97.5 F (36.4 C)  Resp: 18     Body mass index is  17.56 kg/(m^2).    ECOG FS:1 Filed Weights   09/17/13 1340  Weight: 117 lb 3.2 oz (53.162 kg)   baseline weight was 122 pounds.  Sclerae unicteric, pupils equal and reactive Oropharynx clear and moist No cervical or supraclavicular adenopathy Lungs no rales or rhonchi Heart regular rate and rhythm Abd soft, nontender, positive bowel sounds MSK no focal spinal tenderness, no upper extremity lymphedema Neuro: nonfocal, well oriented, appropriate affect Breasts: Both breasts are lumpy. I would not have picked out the right breast the one that.harbored breast cancer simply by exam. The right axilla is benign.     LAB RESULTS:  CMP     Component Value Date/Time   NA 141 09/17/2013 1324   K 3.6 09/17/2013 1324   CO2 24 09/17/2013 1324   GLUCOSE 89 09/17/2013 1324   BUN 15.0 09/17/2013 1324   CREATININE 0.7 09/17/2013 1324   CALCIUM 10.4 09/17/2013 1324   PROT 6.8 09/17/2013 1324   ALBUMIN 4.4 09/17/2013 1324   AST 22 09/17/2013 1324   ALT 25 09/17/2013 1324   ALKPHOS 63 09/17/2013 1324   BILITOT 0.52 09/17/2013 1324     Lab Results  Component Value Date   WBC 2.3* 09/17/2013   NEUTROABS 1.2* 09/17/2013   HGB 12.5 09/17/2013   HCT 36.7 09/17/2013   MCV 101.9* 09/17/2013   PLT 170  09/17/2013      STUDIES: No results found.   ASSESSMENT: 58 y.o. BRCA1 positive [place] woman   (1)  status post right breast biopsy 03/19/2013 for a clinical T2 N0, stage IIA invasive ductal breast cancer, grade 3, triple negative, with an MIB-1 of 54%.  (2) suspicious right axillary lymph node biopsy negative 03/19/2013  (3) received four cycles of dose dense doxorubicin/ cyclophosphamide between 04/17/2013 and 05/29/2013 , at standard doses, with Neulasta support;  (4) on  06/12/2013, started 12 weekly doses of carboplatin/ paclitaxel.  One treatment was held in January secondary to chemotherapy-induced neutropenia, and that cycle was not "made up". Accordingly, patient received a total of 11 weekly doses, with final dose on 09/04/2013.  (5) anticipates definitive surgery 09/30/2013 (Spring Break week) with Dr. Hardin Negus: bilateral mastectomies with immediate expander placement and right-sided sentinel lymph node sampling  (6) anticipates bilateral salpingo-oophorectomy under Dr. Toney Rakes Summer 2015  PLAN: Janet [name] has completed her systemic treatment. I am hoping for a good surgical result. My expectation is that she will not need postmastectomy radiation.  Today we discussed some of the implications of BRCA1 positivity. This has changed the surgical plan to bilateral mastectomies. She will have bilateral salpingo-oophorectomy this summer, while on "break". By that time she should have recovered sufficiently from her breast surgery and reconstruction.  We are not planning any further chemotherapy and therefore her port may be removed at the time of bilateral mastectomies.  Janet [name] had many other questions which were reviewed today. [name] she has done remarkably well with her treatment. I have made her a return appointment with me for late March, at which time we will review the final results of her pathology, further discuss implications of BRCA positivity (including when to  test her children and other family members and the start of birth control pills on her daughters if BRCA positive to reduce the risk of ovarian cancer developing until they are actively trying to become pregnant or decide to undergo bilateral salpingo-oophorectomy.

## 2013-09-18 ENCOUNTER — Ambulatory Visit: Payer: Self-pay | Admitting: Gynecology

## 2013-09-18 ENCOUNTER — Other Ambulatory Visit: Payer: Self-pay

## 2013-09-18 ENCOUNTER — Ambulatory Visit: Payer: BC Managed Care – PPO

## 2013-09-19 ENCOUNTER — Encounter (HOSPITAL_COMMUNITY): Payer: Self-pay | Admitting: Pharmacy Technician

## 2013-09-19 ENCOUNTER — Encounter (INDEPENDENT_AMBULATORY_CARE_PROVIDER_SITE_OTHER): Payer: BC Managed Care – PPO | Admitting: Surgery

## 2013-09-20 ENCOUNTER — Other Ambulatory Visit: Payer: Self-pay | Admitting: Plastic Surgery

## 2013-09-24 ENCOUNTER — Telehealth (INDEPENDENT_AMBULATORY_CARE_PROVIDER_SITE_OTHER): Payer: Self-pay | Admitting: *Deleted

## 2013-09-24 ENCOUNTER — Encounter (HOSPITAL_COMMUNITY)
Admission: RE | Admit: 2013-09-24 | Discharge: 2013-09-24 | Disposition: A | Discharge status: Home / Self Care | Payer: BC Managed Care – PPO | Source: Ambulatory Visit | Attending: Surgery | Admitting: Surgery

## 2013-09-24 ENCOUNTER — Encounter (HOSPITAL_COMMUNITY): Payer: Self-pay

## 2013-09-24 DIAGNOSIS — Z01812 Encounter for preprocedural laboratory examination: Secondary | ICD-10-CM | POA: Insufficient documentation

## 2013-09-24 LAB — CBC
HEMATOCRIT: 36.7 % (ref 36.0–46.0)
HEMOGLOBIN: 12.9 g/dL (ref 12.0–15.0)
MCH: 34.3 pg — AB (ref 26.0–34.0)
MCHC: 35.1 g/dL (ref 30.0–36.0)
MCV: 97.6 fL (ref 78.0–100.0)
Platelets: 138 10*3/uL — ABNORMAL LOW (ref 150–400)
RBC: 3.76 MIL/uL — ABNORMAL LOW (ref 3.87–5.11)
RDW: 13 % (ref 11.5–15.5)
WBC: 2.7 10*3/uL — ABNORMAL LOW (ref 4.0–10.5)

## 2013-09-24 LAB — BASIC METABOLIC PANEL
BUN: 13 mg/dL (ref 6–23)
CALCIUM: 10.5 mg/dL (ref 8.4–10.5)
CO2: 28 mEq/L (ref 19–32)
CREATININE: 0.73 mg/dL (ref 0.50–1.10)
Chloride: 104 mEq/L (ref 96–112)
GLUCOSE: 75 mg/dL (ref 70–99)
Potassium: 4.2 mEq/L (ref 3.7–5.3)
Sodium: 141 mEq/L (ref 137–147)

## 2013-09-24 NOTE — Pre-Procedure Instructions (Addendum)
Janet [name]  09/24/2013   Your procedure is scheduled on:  09/30/13  Report to [facility] short stay admitting at 530 AM.  Call this number if you have problems the morning of surgery: [phone]   Remember:   Do not eat food or drink liquids after midnight.   Take these medicines the morning of surgery with A SIP OF WATER: imitrex, and tylenol if needed          STOP all herbel meds, nsaids (aleve,naproxen,advil,ibuprofen) 5 days prior to surgery including vitamins, aspirin   Do not wear jewelry, make-up or nail polish.  Do not wear lotions, powders, or perfumes. You may wear deodorant.  Do not shave 48 hours prior to surgery. Men may shave face and neck.  Do not bring valuables to the hospital.  [facility] is not responsible                  for any belongings or valuables.               Contacts, dentures or bridgework may not be worn into surgery.  Leave suitcase in the car. After surgery it may be brought to your room.  For patients admitted to the hospital, discharge time is determined by your                treatment team.               Patients discharged the day of surgery will not be allowed to drive  home.  Name and phone number of your driver:   Special Instructions:  Special Instructions: [name] - Preparing for Surgery  Before surgery, you can play an important role.  Because skin is not sterile, your skin needs to be as free of germs as possible.  You can reduce the number of germs on you skin by washing with CHG (chlorahexidine gluconate) soap before surgery.  CHG is an antiseptic cleaner which kills germs and bonds with the skin to continue killing germs even after washing.  Please DO NOT use if you have an allergy to CHG or antibacterial soaps.  If your skin becomes reddened/irritated stop using the CHG and inform your nurse when you arrive at Short Stay.  Do not shave (including legs and underarms) for at least 48 hours prior to the first CHG shower.  You may  shave your face.  Please follow these instructions carefully:   1.  Shower with CHG Soap the night before surgery and the morning of Surgery.  2.  If you choose to wash your hair, wash your hair first as usual with your normal shampoo.  3.  After you shampoo, rinse your hair and body thoroughly to remove the Shampoo.  4.  Use CHG as you would any other liquid soap.  You can apply chg directly  to the skin and wash gently with scrungie or a clean washcloth.  5.  Apply the CHG Soap to your body ONLY FROM THE NECK DOWN.  Do not use on open wounds or open sores.  Avoid contact with your eyes ears, mouth and genitals (private parts).  Wash genitals (private parts)       with your normal soap.  6.  Wash thoroughly, paying special attention to the area where your surgery will be performed.  7.  Thoroughly rinse your body with warm water from the neck down.  8.  DO NOT shower/wash with your normal soap after using and rinsing  off the CHG Soap.  9.  Pat yourself dry with a clean towel.            10.  Wear clean pajamas.            11.  Place clean sheets on your bed the night of your first shower and do not sleep with pets.  Day of Surgery  Do not apply any lotions/deodorants the morning of surgery.  Please wear clean clothes to the hospital/surgery center.   Please read over the following fact sheets that you were given: Pain Booklet, Coughing and Deep Breathing and Surgical Site Infection Prevention

## 2013-09-24 NOTE — Progress Notes (Signed)
Patient stated" dr Theophilus Bones removal of port a cath during surgery". Called [facility] and spoke with kristin. She will send note to dr Lucia Gaskins.

## 2013-09-24 NOTE — Telephone Encounter (Signed)
Janet [name] who is with patient at pre-op called to ask about the [facility] removal being added to the consent order.  Patient states that Dr. Jana Hakim told patient he would send a message to Dr. Lucia Gaskins so everything could be done at one time.  Patient is scheduled for  Bilateral mastectomies on 09/30/13.  It is documented in Dr. Virgie Dad office note from 09/17/13 that states that it is ok to remove PAC.

## 2013-09-25 ENCOUNTER — Other Ambulatory Visit (INDEPENDENT_AMBULATORY_CARE_PROVIDER_SITE_OTHER): Payer: Self-pay | Admitting: Surgery

## 2013-09-27 ENCOUNTER — Ambulatory Visit (INDEPENDENT_AMBULATORY_CARE_PROVIDER_SITE_OTHER): Payer: BC Managed Care – PPO | Admitting: Surgery

## 2013-09-27 ENCOUNTER — Encounter (INDEPENDENT_AMBULATORY_CARE_PROVIDER_SITE_OTHER): Payer: Self-pay | Admitting: Surgery

## 2013-09-27 VITALS — BP 124/70 | HR 76 | Temp 98.2°F | Resp 14 | Ht 68.0 in | Wt 113.0 lb

## 2013-09-27 DIAGNOSIS — C50411 Malignant neoplasm of upper-outer quadrant of right female breast: Secondary | ICD-10-CM

## 2013-09-27 DIAGNOSIS — C50419 Malignant neoplasm of upper-outer quadrant of unspecified female breast: Secondary | ICD-10-CM

## 2013-09-27 NOTE — Progress Notes (Signed)
 Re:   Janet [name] DOB:   07/12/1956 MRN:   [id number]  ASSESSMENT AND PLAN: 1.  Right breast cancer, T2, N0 - 11 o'clock  Grade 3, triple negative invasive ductal carcinoma.  Ki67 - 54%  US - 03/19/2014- right breast 11 o'clock in a subareolar location measuring 2.6 x 1.7 x 2.4 cm.  MRI - 03/20/2013 - 3.8 x 2.9 x 2.5 cm right breast mass.  Oncology - Magrinat/Murray  Underwent neoadjuvant chemotx by Dr. Magrinat. Four cycles of doxorubicin/cyclophosphamide, followed by 12 weekly doses of carboplatin/paclitaxel.  She has what appears to be a very good response to the treatment.     We talked about nipple sparing mastectomy and the options.  But because of how close her original tumor was to the nipple/areola complex, I think she is best served with a skin sparing (excise areola) mastectomy.  I talked about doing a nipple sparing mastectomy on the left, but she wanted to stay symmetric.  Her surgery is planned for Monday, 10/01/2103.  I spent over 45 minutes with Janet [name].  2.  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    Plan bilateral mastectomies with reconstruction by Dr. D. Bowers.  She plans bilateral oophorectomy this summer with J. Fernandez.  This will be timed with switching out her expanders.  3.  Migraines.  No chief complaint on file.  REFERRING PHYSICIAN: GRIFFIN,ELAINE COLLINS, MD  HISTORY OF PRESENT ILLNESS: Janet [name] is a 57 y.o. (DOB: 01/24/1956)  white  female whose primary care physician is GRIFFIN,ELAINE COLLINS, MD and comes to me today for follow up of a right breast cancer - originally seen by Dr. C. Streck. She comes by herself.  She comes to discuss her cancer surgery on Monday, 09/30/2013. I spent probably 40 minutes with Janet [name], reviewing the treatment, drawing pictures for her, discussing the options.  We discussed nipple sparing mastectomy.  And despite her great response, I think she would be best with a skin sparing mastectomy, but taking the nipple/areola complex. I  talked about a nipple sparing mastectomy on the left, but she wanted uniformity, which I think makes sense.  History of breast cancer (9/20150: Janet [name] felt a right breast mass late summer of 2014.  This prompted a biopsy of her right breast biopsy ([name]) on 03/19/2013 which showed grade 3, triple negative invasive ductal carcinoma.  Ki67-54%.  A core right axillary lymph node biopsy at the same time was negative.  MRI 03/20/2013 showed an enhancing right breast mass which measures 3.8 x 2.9 x 2.5 cm.   She has seen Dr. Magrinat, C. Streck, and R. Murray in the [facility] on 03/27/2013.  The plan was neoadjuvant chemotx and see how things go.  She had a power port placed by Dr. Streck 04/04/2013.  And she has received doxorubicin/cyclophosphamide 4 cycles followed by carboplaten/pacletaxel x 12 doses.  The mass has shown some improvement by MRI done 06/07/2013, but the patient still feels a mass that is about the same..  It sounds like at first she was not sure of genetics.  But she has a cousin who is BRCA 1 positive and she has seen Karen Powell and her genetic test is pending.  Janet [name]'s father's maternal first cousin has two daughters who have tested positive for a BRCA1 mutation.  She wants surgery the week of 09/30/2013.   Past Medical History  Diagnosis Date  . Migraines   . Cancer     breast  . Breast cancer   .   Allergy   . BRCA1 positive 2015  . Anemia, unspecified 07/09/2013    chemo    Past Surgical History  Procedure Laterality Date  . Removal of cataracts    . Foot surgery  age 19    bone graft  . Portacath placement N/A 04/04/2013    Procedure: INSERTION PORT-A-CATH;  Surgeon: Christian J Streck, MD;  Location: [facility]S;  Service: General;  Laterality: N/A;     Current Outpatient Prescriptions  Medication Sig Dispense Refill  . acetaminophen (TYLENOL) 325 MG tablet Take 650 mg by mouth every 6 (six) hours as needed for pain.      . Multiple Vitamin (MULTI-VITAMIN DAILY PO)  Take 1 tablet by mouth daily.       . SUMAtriptan (IMITREX) 50 MG tablet Take 1 tablet (50 mg total) by mouth every 2 (two) hours as needed for migraine. May repeat in 2 hours if headache persists or recurs.  10 tablet  0   No current facility-administered medications for this visit.   Facility-Administered Medications Ordered in Other Visits  Medication Dose Route Frequency Provider Last Rate Last Dose  . sodium chloride 0.9 % injection 10 mL  10 mL Intracatheter PRN Amy G Berry, PA-C   10 mL at 09/04/13 1652     Allergies  Allergen Reactions  . Azithromycin Hives  . Penicillins Rash  Penicillin allergy was as a child - she is unsure, but will check with Mom.  REVIEW OF SYSTEMS: Skin:  No history of rash.  No history of abnormal moles. Infection:  No history of hepatitis or HIV.  No history of MRSA. Neurologic:  No history of stroke.  No history of seizure.  No history of headaches. Cardiac:  No history of hypertension. No history of heart disease.  No history of prior cardiac catheterization.  No history of seeing a cardiologist. Pulmonary:  Does not smoke cigarettes.  No asthma or bronchitis.  No OSA/CPAP.  Endocrine:  No diabetes. No thyroid disease. Gastrointestinal:  No history of stomach disease.  No history of liver disease.  No history of gall bladder disease.  No history of pancreas disease.  No history of colon disease. Urologic:  No history of kidney stones.  No history of bladder infections. Musculoskeletal:  No history of joint or back disease. Hematologic:  No bleeding disorder.  No history of anemia.  Not anticoagulated. Psycho-social:  The patient is oriented.   The patient has no obvious psychologic or social impairment to understanding our conversation and plan.  SOCIAL and FAMILY HISTORY: Married.  Husband Robbie is a judge. Two daughters - Molly and Bayley. 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  PHYSICAL EXAM: BP 124/70  Pulse 76  Temp(Src) 98.2 F (36.8 C)  (Oral)  Resp 14  Ht 5' 8" (1.727 m)  Wt 113 lb (51.256 kg)  BMI 17.19 kg/m2  General: Thin WN WF who is alert.  She is wearing a bonnett and has no hair. HEENT: Normal. Pupils equal. Neck: Supple. No mass.  No thyroid mass. Lymph Nodes:  No supraclavicular, cervical, or axillary nodes. Lungs: Clear to auscultation and symmetric breath sounds. Heart:  RRR. No murmur or rub. Breasts:  Right - On my last visit, 08/15/2013, I thought that I could feel a residual 2.5 cm mass at the edge of the right areola at the 10 o'clock position.  But today, she is lumpy, she has no body fat, but I am do not feel the same mass.  Left -   No mass.  Port in upper inner left breast.  DATA REVIEWED: Epic notes  Asaf Elmquist, MD,  FACS [facility] [address]  [address]   [place], [place]    [address] Phone:  [phone] FAX:  [phone]  

## 2013-09-29 MED ORDER — VANCOMYCIN HCL IN DEXTROSE 1-5 GM/200ML-% IV SOLN
1000.0000 mg | INTRAVENOUS | Status: AC
  Administered 2013-09-30: 1000 mg via INTRAVENOUS
  Filled 2013-09-29 (×2): qty 200

## 2013-09-29 MED ORDER — CEFAZOLIN SODIUM-DEXTROSE 2-3 GM-% IV SOLR
2.0000 g | INTRAVENOUS | Status: AC
  Administered 2013-09-30: 2 g via INTRAVENOUS
  Filled 2013-09-29 (×2): qty 50

## 2013-09-30 ENCOUNTER — Encounter (HOSPITAL_COMMUNITY): Payer: Self-pay | Admitting: Certified Registered Nurse Anesthetist

## 2013-09-30 ENCOUNTER — Inpatient Hospital Stay (HOSPITAL_COMMUNITY)
Admission: RE | Admit: 2013-09-30 | Discharge: 2013-10-01 | DRG: 583 | Disposition: A | Discharge status: Home / Self Care | Payer: BC Managed Care – PPO | Source: Ambulatory Visit | Attending: Plastic Surgery | Admitting: Plastic Surgery

## 2013-09-30 ENCOUNTER — Inpatient Hospital Stay (HOSPITAL_COMMUNITY): Payer: BC Managed Care – PPO | Admitting: Certified Registered Nurse Anesthetist

## 2013-09-30 ENCOUNTER — Encounter (HOSPITAL_COMMUNITY)
Admission: RE | Disposition: A | Discharge status: Home / Self Care | Payer: Self-pay | Source: Ambulatory Visit | Attending: Plastic Surgery

## 2013-09-30 ENCOUNTER — Encounter (HOSPITAL_COMMUNITY)
Admission: RE | Admit: 2013-09-30 | Discharge: 2013-09-30 | Disposition: A | Discharge status: Home / Self Care | Payer: BC Managed Care – PPO | Source: Ambulatory Visit | Attending: Surgery | Admitting: Surgery

## 2013-09-30 ENCOUNTER — Encounter (HOSPITAL_COMMUNITY): Payer: BC Managed Care – PPO | Admitting: Certified Registered Nurse Anesthetist

## 2013-09-30 DIAGNOSIS — C50919 Malignant neoplasm of unspecified site of unspecified female breast: Secondary | ICD-10-CM | POA: Insufficient documentation

## 2013-09-30 DIAGNOSIS — Z853 Personal history of malignant neoplasm of breast: Secondary | ICD-10-CM

## 2013-09-30 DIAGNOSIS — R920 Mammographic microcalcification found on diagnostic imaging of breast: Secondary | ICD-10-CM

## 2013-09-30 DIAGNOSIS — G43909 Migraine, unspecified, not intractable, without status migrainosus: Secondary | ICD-10-CM | POA: Diagnosis present

## 2013-09-30 DIAGNOSIS — N6019 Diffuse cystic mastopathy of unspecified breast: Secondary | ICD-10-CM

## 2013-09-30 DIAGNOSIS — C50411 Malignant neoplasm of upper-outer quadrant of right female breast: Secondary | ICD-10-CM

## 2013-09-30 DIAGNOSIS — D0590 Unspecified type of carcinoma in situ of unspecified breast: Secondary | ICD-10-CM

## 2013-09-30 DIAGNOSIS — Z452 Encounter for adjustment and management of vascular access device: Secondary | ICD-10-CM

## 2013-09-30 HISTORY — PX: BREAST RECONSTRUCTION WITH PLACEMENT OF TISSUE EXPANDER AND FLEX HD (ACELLULAR HYDRATED DERMIS): SHX6295

## 2013-09-30 HISTORY — PX: PORT-A-CATH REMOVAL: SHX5289

## 2013-09-30 HISTORY — PX: MASTECTOMY WITH AXILLARY LYMPH NODE DISSECTION: SHX5661

## 2013-09-30 SURGERY — MASTECTOMY WITH AXILLARY LYMPH NODE DISSECTION
Anesthesia: General | Site: Chest | Laterality: Left

## 2013-09-30 MED ORDER — DOCUSATE SODIUM 100 MG PO CAPS
100.0000 mg | ORAL_CAPSULE | Freq: Every day | ORAL | Status: DC
  Administered 2013-10-01: 100 mg via ORAL
  Filled 2013-09-30: qty 1

## 2013-09-30 MED ORDER — CHLORHEXIDINE GLUCONATE 4 % EX LIQD: 1.0000 "application " | Freq: Once | CUTANEOUS | Status: DC

## 2013-09-30 MED ORDER — PHENYLEPHRINE HCL 10 MG/ML IJ SOLN
10.0000 mg | INTRAVENOUS | Status: DC | PRN
  Administered 2013-09-30: 15 ug/min via INTRAVENOUS

## 2013-09-30 MED ORDER — ROCURONIUM BROMIDE 100 MG/10ML IV SOLN
INTRAVENOUS | Status: DC | PRN
  Administered 2013-09-30: 30 mg via INTRAVENOUS
  Administered 2013-09-30: 5 mg via INTRAVENOUS
  Administered 2013-09-30 (×2): 10 mg via INTRAVENOUS
  Administered 2013-09-30: 20 mg via INTRAVENOUS
  Administered 2013-09-30: 40 mg via INTRAVENOUS
  Administered 2013-09-30: 10 mg via INTRAVENOUS

## 2013-09-30 MED ORDER — HYDROMORPHONE HCL PF 1 MG/ML IJ SOLN
0.2500 mg | INTRAMUSCULAR | Status: DC | PRN
  Administered 2013-09-30: 0.5 mg via INTRAVENOUS

## 2013-09-30 MED ORDER — VANCOMYCIN HCL IN DEXTROSE 750-5 MG/150ML-% IV SOLN
750.0000 mg | Freq: Two times a day (BID) | INTRAVENOUS | Status: DC
  Administered 2013-09-30 – 2013-10-01 (×2): 750 mg via INTRAVENOUS
  Filled 2013-09-30 (×2): qty 150

## 2013-09-30 MED ORDER — ROCURONIUM BROMIDE 50 MG/5ML IV SOLN
INTRAVENOUS | Status: AC
  Filled 2013-09-30: qty 1

## 2013-09-30 MED ORDER — SODIUM CHLORIDE 0.9 % IJ SOLN
INTRAMUSCULAR | Status: AC
  Filled 2013-09-30: qty 10

## 2013-09-30 MED ORDER — FENTANYL CITRATE 0.05 MG/ML IJ SOLN
INTRAMUSCULAR | Status: AC
  Filled 2013-09-30: qty 5

## 2013-09-30 MED ORDER — PROMETHAZINE HCL 25 MG/ML IJ SOLN: 6.2500 mg | INTRAMUSCULAR | Status: DC | PRN

## 2013-09-30 MED ORDER — ONDANSETRON HCL 4 MG/2ML IJ SOLN
INTRAMUSCULAR | Status: DC | PRN
  Administered 2013-09-30: 4 mg via INTRAVENOUS

## 2013-09-30 MED ORDER — PHENYLEPHRINE HCL 10 MG/ML IJ SOLN
INTRAMUSCULAR | Status: AC
  Filled 2013-09-30: qty 1

## 2013-09-30 MED ORDER — PROPOFOL 10 MG/ML IV BOLUS
INTRAVENOUS | Status: DC | PRN
  Administered 2013-09-30: 150 mg via INTRAVENOUS

## 2013-09-30 MED ORDER — PROPOFOL 10 MG/ML IV BOLUS
INTRAVENOUS | Status: AC
  Filled 2013-09-30: qty 20

## 2013-09-30 MED ORDER — SODIUM CHLORIDE 0.9 % IJ SOLN
9.0000 mL | INTRAMUSCULAR | Status: DC | PRN
Start: 2013-09-30 — End: 2013-10-01

## 2013-09-30 MED ORDER — SODIUM CHLORIDE 0.9 % IR SOLN
Status: DC | PRN
  Administered 2013-09-30: 1000 mL

## 2013-09-30 MED ORDER — ONDANSETRON HCL 4 MG/2ML IJ SOLN
INTRAMUSCULAR | Status: AC
  Filled 2013-09-30: qty 2

## 2013-09-30 MED ORDER — HYDROMORPHONE 0.3 MG/ML IV SOLN
INTRAVENOUS | Status: DC
  Administered 2013-09-30: 15:00:00 via INTRAVENOUS
  Administered 2013-09-30: 0.999 mg via INTRAVENOUS
  Administered 2013-10-01 (×2): 0.599 mg via INTRAVENOUS
  Filled 2013-09-30: qty 25

## 2013-09-30 MED ORDER — ACETAMINOPHEN 325 MG PO TABS
650.0000 mg | ORAL_TABLET | Freq: Four times a day (QID) | ORAL | Status: DC | PRN
  Administered 2013-09-30 – 2013-10-01 (×3): 650 mg via ORAL
  Filled 2013-09-30 (×3): qty 2

## 2013-09-30 MED ORDER — PHENYLEPHRINE HCL 10 MG/ML IJ SOLN
INTRAMUSCULAR | Status: DC | PRN
  Administered 2013-09-30 (×2): 40 ug via INTRAVENOUS

## 2013-09-30 MED ORDER — SODIUM CHLORIDE 0.9 % IR SOLN
Freq: Once | Status: DC
  Filled 2013-09-30: qty 3

## 2013-09-30 MED ORDER — LACTATED RINGERS IV SOLN
INTRAVENOUS | Status: DC | PRN
  Administered 2013-09-30 (×3): via INTRAVENOUS

## 2013-09-30 MED ORDER — MIDAZOLAM HCL 5 MG/5ML IJ SOLN
INTRAMUSCULAR | Status: DC | PRN
  Administered 2013-09-30: 2 mg via INTRAVENOUS

## 2013-09-30 MED ORDER — ONDANSETRON HCL 4 MG/2ML IJ SOLN
4.0000 mg | Freq: Four times a day (QID) | INTRAMUSCULAR | Status: DC | PRN
  Administered 2013-09-30: 4 mg via INTRAVENOUS
  Filled 2013-09-30: qty 2

## 2013-09-30 MED ORDER — METHOCARBAMOL 500 MG PO TABS: 500.0000 mg | ORAL_TABLET | Freq: Four times a day (QID) | ORAL | Status: DC | PRN

## 2013-09-30 MED ORDER — SODIUM CHLORIDE 0.9 % IR SOLN
Status: DC | PRN
  Administered 2013-09-30: 11:00:00

## 2013-09-30 MED ORDER — HEPARIN SODIUM (PORCINE) 5000 UNIT/ML IJ SOLN: 5000.0000 [IU] | Freq: Once | INTRAMUSCULAR | Status: DC

## 2013-09-30 MED ORDER — NALOXONE HCL 0.4 MG/ML IJ SOLN: 0.4000 mg | INTRAMUSCULAR | Status: DC | PRN

## 2013-09-30 MED ORDER — DEXTROSE-NACL 5-0.45 % IV SOLN
INTRAVENOUS | Status: DC
  Administered 2013-09-30 – 2013-10-01 (×2): via INTRAVENOUS

## 2013-09-30 MED ORDER — NEOSTIGMINE METHYLSULFATE 1 MG/ML IJ SOLN
INTRAMUSCULAR | Status: AC
  Filled 2013-09-30: qty 10

## 2013-09-30 MED ORDER — MIDAZOLAM HCL 2 MG/2ML IJ SOLN
INTRAMUSCULAR | Status: AC
  Filled 2013-09-30: qty 2

## 2013-09-30 MED ORDER — GLYCOPYRROLATE 0.2 MG/ML IJ SOLN
INTRAMUSCULAR | Status: AC
  Filled 2013-09-30: qty 2

## 2013-09-30 MED ORDER — NEOSTIGMINE METHYLSULFATE 1 MG/ML IJ SOLN
INTRAMUSCULAR | Status: DC | PRN
  Administered 2013-09-30: 3 mg via INTRAVENOUS

## 2013-09-30 MED ORDER — GLYCOPYRROLATE 0.2 MG/ML IJ SOLN
INTRAMUSCULAR | Status: DC | PRN
  Administered 2013-09-30: .4 mg via INTRAVENOUS

## 2013-09-30 MED ORDER — LIDOCAINE HCL (CARDIAC) 20 MG/ML IV SOLN
INTRAVENOUS | Status: DC | PRN
  Administered 2013-09-30: 100 mg via INTRAVENOUS

## 2013-09-30 MED ORDER — DIPHENHYDRAMINE HCL 12.5 MG/5ML PO ELIX: 12.5000 mg | ORAL_SOLUTION | Freq: Four times a day (QID) | ORAL | Status: DC | PRN

## 2013-09-30 MED ORDER — HYDROMORPHONE HCL PF 1 MG/ML IJ SOLN
INTRAMUSCULAR | Status: AC
  Filled 2013-09-30: qty 1

## 2013-09-30 MED ORDER — LIDOCAINE HCL (CARDIAC) 20 MG/ML IV SOLN
INTRAVENOUS | Status: AC
  Filled 2013-09-30: qty 5

## 2013-09-30 MED ORDER — DIPHENHYDRAMINE HCL 50 MG/ML IJ SOLN: 12.5000 mg | Freq: Four times a day (QID) | INTRAMUSCULAR | Status: DC | PRN

## 2013-09-30 MED ORDER — CHLORHEXIDINE GLUCONATE 4 % EX LIQD
1.0000 "application " | Freq: Once | CUTANEOUS | Status: DC
  Filled 2013-09-30: qty 15

## 2013-09-30 MED ORDER — 0.9 % SODIUM CHLORIDE (POUR BTL) OPTIME
TOPICAL | Status: DC | PRN
  Administered 2013-09-30 (×3): 1000 mL

## 2013-09-30 MED ORDER — EPHEDRINE SULFATE 50 MG/ML IJ SOLN
INTRAMUSCULAR | Status: AC
  Filled 2013-09-30: qty 1

## 2013-09-30 MED ORDER — TECHNETIUM TC 99M SULFUR COLLOID FILTERED
1.0000 | Freq: Once | INTRAVENOUS | Status: AC | PRN
  Administered 2013-09-30: 1 via INTRADERMAL

## 2013-09-30 MED ORDER — FENTANYL CITRATE 0.05 MG/ML IJ SOLN
INTRAMUSCULAR | Status: DC | PRN
  Administered 2013-09-30 (×5): 50 ug via INTRAVENOUS

## 2013-09-30 SURGICAL SUPPLY — 80 items
APPLIER CLIP 9.375 MED OPEN (MISCELLANEOUS) ×4
ATCH SMKEVC FLXB CAUT HNDSWH (FILTER) ×4 IMPLANT
BAG DECANTER FOR FLEXI CONT (MISCELLANEOUS) ×8 IMPLANT
BENZOIN TINCTURE PRP APPL 2/3 (GAUZE/BANDAGES/DRESSINGS) ×4 IMPLANT
BINDER BREAST LRG (GAUZE/BANDAGES/DRESSINGS) ×4 IMPLANT
BINDER BREAST XLRG (GAUZE/BANDAGES/DRESSINGS) IMPLANT
BIOPATCH BLUE 3/4IN DISK W/1.5 (GAUZE/BANDAGES/DRESSINGS) ×4 IMPLANT
BIOPATCH RED 1 DISK 7.0 (GAUZE/BANDAGES/DRESSINGS) ×12 IMPLANT
BIOPATCH RED 1IN DISK 7.0MM (GAUZE/BANDAGES/DRESSINGS) ×4
CANISTER SUCTION 2500CC (MISCELLANEOUS) ×8 IMPLANT
CHLORAPREP W/TINT 26ML (MISCELLANEOUS) ×4 IMPLANT
CLIP APPLIE 9.375 MED OPEN (MISCELLANEOUS) ×2 IMPLANT
CLOSURE WOUND 1/4X4 (GAUZE/BANDAGES/DRESSINGS) ×1
COVER PROBE W GEL 5X96 (DRAPES) ×4 IMPLANT
COVER SURGICAL LIGHT HANDLE (MISCELLANEOUS) ×8 IMPLANT
DERMABOND ADVANCED (GAUZE/BANDAGES/DRESSINGS) ×2
DERMABOND ADVANCED .7 DNX12 (GAUZE/BANDAGES/DRESSINGS) ×2 IMPLANT
DRAIN CHANNEL 19F RND (DRAIN) ×16 IMPLANT
DRAPE CHEST BREAST 15X10 FENES (DRAPES) ×4 IMPLANT
DRAPE ORTHO SPLIT 77X108 STRL (DRAPES) ×4
DRAPE PROXIMA HALF (DRAPES) ×16 IMPLANT
DRAPE SURG 17X23 STRL (DRAPES) ×8 IMPLANT
DRAPE SURG ORHT 6 SPLT 77X108 (DRAPES) ×4 IMPLANT
DRAPE UTILITY 15X26 W/TAPE STR (DRAPE) ×8 IMPLANT
DRAPE WARM FLUID 44X44 (DRAPE) ×4 IMPLANT
DRESSING TELFA 8X3 (GAUZE/BANDAGES/DRESSINGS) ×4 IMPLANT
DRSG PAD ABDOMINAL 8X10 ST (GAUZE/BANDAGES/DRESSINGS) ×12 IMPLANT
DRSG TEGADERM 4X4.75 (GAUZE/BANDAGES/DRESSINGS) ×4 IMPLANT
ELECT BLADE 6.5 EXT (BLADE) IMPLANT
ELECT CAUTERY BLADE 6.4 (BLADE) ×8 IMPLANT
ELECT REM PT RETURN 9FT ADLT (ELECTROSURGICAL) ×8
ELECTRODE REM PT RTRN 9FT ADLT (ELECTROSURGICAL) ×4 IMPLANT
EVACUATOR SILICONE 100CC (DRAIN) ×16 IMPLANT
EVACUATOR SMOKE ACCUVAC VALLEY (FILTER) ×4
GLOVE BIO SURGEON STRL SZ7.5 (GLOVE) ×4 IMPLANT
GLOVE BIOGEL PI IND STRL 7.0 (GLOVE) ×6 IMPLANT
GLOVE BIOGEL PI IND STRL 7.5 (GLOVE) ×8 IMPLANT
GLOVE BIOGEL PI IND STRL 8 (GLOVE) ×2 IMPLANT
GLOVE BIOGEL PI INDICATOR 7.0 (GLOVE) ×6
GLOVE BIOGEL PI INDICATOR 7.5 (GLOVE) ×8
GLOVE BIOGEL PI INDICATOR 8 (GLOVE) ×2
GLOVE ECLIPSE 6.5 STRL STRAW (GLOVE) ×8 IMPLANT
GLOVE ECLIPSE 7.5 STRL STRAW (GLOVE) ×16 IMPLANT
GLOVE SURG SIGNA 7.5 PF LTX (GLOVE) ×4 IMPLANT
GLOVE SURG SS PI 6.5 STRL IVOR (GLOVE) ×4 IMPLANT
GLOVE SURG SS PI 7.0 STRL IVOR (GLOVE) ×16 IMPLANT
GOWN STRL NON-REIN LRG LVL3 (GOWN DISPOSABLE) ×24 IMPLANT
GOWN STRL REIN XL XLG (GOWN DISPOSABLE) ×12 IMPLANT
IMPL BREAST TIS EXP M 350CC (Breast) ×4 IMPLANT
IMPLANT BREAST TIS EXP M 350CC (Breast) ×8 IMPLANT
KIT BASIN OR (CUSTOM PROCEDURE TRAY) ×8 IMPLANT
KIT ROOM TURNOVER OR (KITS) ×8 IMPLANT
MARKER SKIN DUAL TIP RULER LAB (MISCELLANEOUS) ×4 IMPLANT
NS IRRIG 1000ML POUR BTL (IV SOLUTION) ×12 IMPLANT
PACK GENERAL/GYN (CUSTOM PROCEDURE TRAY) ×8 IMPLANT
PAD ABD 8X10 STRL (GAUZE/BANDAGES/DRESSINGS) ×8 IMPLANT
PAD ARMBOARD 7.5X6 YLW CONV (MISCELLANEOUS) ×8 IMPLANT
PREFILTER EVAC NS 1 1/3-3/8IN (MISCELLANEOUS) ×8 IMPLANT
SET ASEPTIC TRANSFER (MISCELLANEOUS) ×4 IMPLANT
SPECIMEN JAR X LARGE (MISCELLANEOUS) ×4 IMPLANT
SPONGE GAUZE 4X4 12PLY (GAUZE/BANDAGES/DRESSINGS) ×4 IMPLANT
SPONGE LAP 18X18 X RAY DECT (DISPOSABLE) ×4 IMPLANT
STAPLER VISISTAT 35W (STAPLE) ×4 IMPLANT
STRIP CLOSURE SKIN 1/4X4 (GAUZE/BANDAGES/DRESSINGS) ×3 IMPLANT
SUT ETHILON 2 0 FS 18 (SUTURE) ×8 IMPLANT
SUT MNCRL AB 3-0 PS2 18 (SUTURE) ×28 IMPLANT
SUT MON AB 5-0 PS2 18 (SUTURE) ×4 IMPLANT
SUT PDS AB 3-0 SH 27 (SUTURE) IMPLANT
SUT PROLENE 3 0 PS 2 (SUTURE) ×32 IMPLANT
SUT SILK 3 0 (SUTURE)
SUT SILK 3-0 18XBRD TIE 12 (SUTURE) IMPLANT
SUT VIC AB 3-0 SH 18 (SUTURE) ×12 IMPLANT
SUT VIC AB 3-0 SH 8-18 (SUTURE) ×4 IMPLANT
SYR BULB IRRIGATION 50ML (SYRINGE) ×4 IMPLANT
TOWEL OR 17X24 6PK STRL BLUE (TOWEL DISPOSABLE) ×8 IMPLANT
TOWEL OR 17X26 10 PK STRL BLUE (TOWEL DISPOSABLE) ×8 IMPLANT
TRAY FOLEY CATH 14FRSI W/METER (CATHETERS) ×4 IMPLANT
TUBE CONNECTING 12'X1/4 (SUCTIONS) ×3
TUBE CONNECTING 12X1/4 (SUCTIONS) ×9 IMPLANT
WATER STERILE IRR 1000ML POUR (IV SOLUTION) IMPLANT

## 2013-09-30 NOTE — Transfer of Care (Signed)
Immediate Anesthesia Transfer of Care Note  Patient: Janet [name]  Procedure(s) Performed: Procedure(s): BILATERAL MASTECTOMIES RIGHT  AXILLARY LYMPH NODE BIOPSY (Bilateral) BILATERAL TISSUE EXPANDERS  (Bilateral) REMOVAL PORT-A-CATH (Left)  Patient Location: PACU  Anesthesia Type:General  Level of Consciousness: awake, alert  and oriented  Airway & Oxygen Therapy: Patient Spontanous Breathing  Post-op Assessment: Report given to PACU RN  Post vital signs: Reviewed and stable  Complications: No apparent anesthesia complications

## 2013-09-30 NOTE — Anesthesia Postprocedure Evaluation (Signed)
  Anesthesia Post-op Note  Patient: Janet [name]  Procedure(s) Performed: Procedure(s): BILATERAL MASTECTOMIES RIGHT  AXILLARY LYMPH NODE BIOPSY (Bilateral) BILATERAL TISSUE EXPANDERS  (Bilateral) REMOVAL PORT-A-CATH (Left)  Patient Location: PACU  Anesthesia Type:General  Level of Consciousness: awake  Airway and Oxygen Therapy: Patient Spontanous Breathing  Post-op Pain: mild  Post-op Assessment: Post-op Vital signs reviewed  Post-op Vital Signs: Reviewed  Complications: No apparent anesthesia complications

## 2013-09-30 NOTE — Anesthesia Preprocedure Evaluation (Addendum)
Anesthesia Evaluation  Patient identified by MRN, date of birth, ID band Patient awake    Reviewed: Allergy & Precautions, H&P , NPO status , Patient's Chart, lab work & pertinent test results  Airway Mallampati: II      Dental   Pulmonary neg pulmonary ROS,  breath sounds clear to auscultation        Cardiovascular negative cardio ROS  Rhythm:Regular Rate:Normal     Neuro/Psych    GI/Hepatic negative GI ROS, Neg liver ROS,   Endo/Other  negative endocrine ROS  Renal/GU negative Renal ROS     Musculoskeletal   Abdominal   Peds  Hematology negative hematology ROS (+)   Anesthesia Other Findings   Reproductive/Obstetrics                          Anesthesia Physical Anesthesia Plan  ASA: III  Anesthesia Plan: General   Post-op Pain Management:    Induction: Intravenous  Airway Management Planned: Oral ETT  Additional Equipment:   Intra-op Plan:   Post-operative Plan: Extubation in OR  Informed Consent: I have reviewed the patients History and Physical, chart, labs and discussed the procedure including the risks, benefits and alternatives for the proposed anesthesia with the patient or authorized representative who has indicated his/her understanding and acceptance.   Dental advisory given  Plan Discussed with: CRNA, Anesthesiologist and Surgeon  Anesthesia Plan Comments:        Anesthesia Quick Evaluation

## 2013-09-30 NOTE — Progress Notes (Signed)
NOS  S:  Washed out earlier, but feeling better.  Family in room.  BP 141/58  Pulse 60  Temp(Src) 99.2 F (37.3 C) (Oral)  Resp 14  SpO2 100%  Wound:  Dressed.  Drainage in JP minimal.  A&P:  Doing well early after surgery.  [name], MD, [facility] Pager: [phone] Office phone:  [phone]

## 2013-09-30 NOTE — H&P (View-Only) (Signed)
Re:   Janet [name] DOB:   September 13, 1955 MRN:   [id number]  ASSESSMENT AND PLAN: 1.  Right breast cancer, T2, N0 - 11 o'clock  Grade 3, triple negative invasive ductal carcinoma.  Ki67 - 54%  [place] - 03/19/2014- right breast 11 o'clock in a subareolar location measuring 2.6 x 1.7 x 2.4 cm.  MRI - 03/20/2013 - 3.8 x 2.9 x 2.5 cm right breast mass.  Oncology - Magrinat/Murray  Underwent neoadjuvant chemotx by Dr. Jana Hakim. Four cycles of doxorubicin/cyclophosphamide, followed by 12 weekly doses of carboplatin/paclitaxel.  She has what appears to be a very good response to the treatment.     We talked about nipple sparing mastectomy and the options.  But because of how close her original tumor was to the nipple/areola complex, I think she is best served with a skin sparing (excise areola) mastectomy.  I talked about doing a nipple sparing mastectomy on the left, but she wanted to stay symmetric.  Her surgery is planned for Monday, 10/01/2103.  I spent over 45 minutes with Janet [name].  2.  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    Plan bilateral mastectomies with reconstruction by Dr. Baruch Goldmann.  She plans bilateral oophorectomy this summer with Hebert Soho.  This will be timed with switching out her expanders.  3.  Migraines.  No chief complaint on file.  REFERRING PHYSICIAN: Osborne Casco, MD  HISTORY OF PRESENT ILLNESS: Janet [name] is a 58 y.o. (DOB: January 22, 1956)  white  female whose primary care physician is Osborne Casco, MD and comes to me today for follow up of a right breast cancer - originally seen by Dr. Chilton Greathouse. She comes by herself.  She comes to discuss her cancer surgery on Monday, 09/30/2013. I spent probably 40 minutes with Janet [name], reviewing the treatment, drawing pictures for her, discussing the options.  We discussed nipple sparing mastectomy.  And despite her great response, I think she would be best with a skin sparing mastectomy, but taking the nipple/areola complex. I  talked about a nipple sparing mastectomy on the left, but she wanted uniformity, which I think makes sense.  History of breast cancer (9/20150: Janet [name] felt a right breast mass late summer of 2014.  This prompted a biopsy of her right breast biopsy ([name]) on 03/19/2013 which showed grade 3, triple negative invasive ductal carcinoma.  Ki67-54%.  A core right axillary lymph node biopsy at the same time was negative.  MRI 03/20/2013 showed an enhancing right breast mass which measures 3.8 x 2.9 x 2.5 cm.   She has seen Dr. Jana Hakim, Chilton Greathouse, and Cristela Felt in the [facility] on 03/27/2013.  The plan was neoadjuvant chemotx and see how things go.  She had a power port placed by Dr. Margot Chimes 04/04/2013.  And she has received doxorubicin/cyclophosphamide 4 cycles followed by carboplaten/pacletaxel x 12 doses.  The mass has shown some improvement by MRI done 06/07/2013, but the patient still feels a mass that is about the same..  It sounds like at first she was not sure of genetics.  But she has a cousin who is BRCA 1 positive and she has seen Roma Kayser and her genetic test is pending.  Janet [name]'s father's maternal first cousin has two daughters who have tested positive for a BRCA1 mutation.  She wants surgery the week of 09/30/2013.   Past Medical History  Diagnosis Date  . Migraines   . Cancer     breast  . Breast cancer   .  Allergy   . BRCA1 positive 2015  . Anemia, unspecified 07/09/2013    chemo    Past Surgical History  Procedure Laterality Date  . Removal of cataracts    . Foot surgery  age 21    bone graft  . Portacath placement N/A 04/04/2013    Procedure: INSERTION PORT-A-CATH;  Surgeon: Haywood Lasso, MD;  Location: [facility];  Service: General;  Laterality: N/A;     Current Outpatient Prescriptions  Medication Sig Dispense Refill  . acetaminophen (TYLENOL) 325 MG tablet Take 650 mg by mouth every 6 (six) hours as needed for pain.      . Multiple Vitamin (MULTI-VITAMIN DAILY PO)  Take 1 tablet by mouth daily.       . SUMAtriptan (IMITREX) 50 MG tablet Take 1 tablet (50 mg total) by mouth every 2 (two) hours as needed for migraine. May repeat in 2 hours if headache persists or recurs.  10 tablet  0   No current facility-administered medications for this visit.   Facility-Administered Medications Ordered in Other Visits  Medication Dose Route Frequency Provider Last Rate Last Dose  . sodium chloride 0.9 % injection 10 mL  10 mL Intracatheter PRN Amy Milda Smart, PA-C   10 mL at 09/04/13 1652     Allergies  Allergen Reactions  . Azithromycin Hives  . Penicillins Rash  Penicillin allergy was as a child - she is unsure, but will check with Mom.  REVIEW OF SYSTEMS: Skin:  No history of rash.  No history of abnormal moles. Infection:  No history of hepatitis or HIV.  No history of MRSA. Neurologic:  No history of stroke.  No history of seizure.  No history of headaches. Cardiac:  No history of hypertension. No history of heart disease.  No history of prior cardiac catheterization.  No history of seeing a cardiologist. Pulmonary:  Does not smoke cigarettes.  No asthma or bronchitis.  No OSA/CPAP.  Endocrine:  No diabetes. No thyroid disease. Gastrointestinal:  No history of stomach disease.  No history of liver disease.  No history of gall bladder disease.  No history of pancreas disease.  No history of colon disease. Urologic:  No history of kidney stones.  No history of bladder infections. Musculoskeletal:  No history of joint or back disease. Hematologic:  No bleeding disorder.  No history of anemia.  Not anticoagulated. Psycho-social:  The patient is oriented.   The patient has no obvious psychologic or social impairment to understanding our conversation and plan.  SOCIAL and FAMILY HISTORY: Married.  Husband Heath Lark is a judge. Two daughters - [name] and Adams. 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  PHYSICAL EXAM: BP 124/70  Pulse 76  Temp(Src) 98.2 F (36.8 C)  (Oral)  Resp 14  Ht _0  (1.727 m)  Wt 113 lb (51.256 kg)  BMI 17.19 kg/m2  General: Thin WN WF who is alert.  She is wearing a bonnett and has no hair. HEENT: Normal. Pupils equal. Neck: Supple. No mass.  No thyroid mass. Lymph Nodes:  No supraclavicular, cervical, or axillary nodes. Lungs: Clear to auscultation and symmetric breath sounds. Heart:  RRR. No murmur or rub. Breasts:  Right - On my last visit, 08/15/2013, I thought that I could feel a residual 2.5 cm mass at the edge of the right areola at the 10 o'clock position.  But today, she is lumpy, she has no body fat, but I am do not feel the same mass.  Left -  No mass.  Port in upper inner left breast.  DATA REVIEWED: Epic notes  [name], MD,  [facility] [place] [address] [place]  [place], [place]    [place] Phone:  [phone] FAX:  [phone]

## 2013-09-30 NOTE — Interval H&P Note (Signed)
History and Physical Interval Note:  09/30/2013 7:12 AM  Janet [name]  has presented today for surgery, with the diagnosis of right breast cancer, BRCA 1 positive   The various methods of treatment have been discussed with the patient and family.  Heath Lark, husband, at bedside.   After consideration of risks, benefits and other options for treatment, the patient has consented to  Procedure(s): BILATERAL MASTECTOMIES RIGHT  AXILLARY LYMPH NODE BIOPSY (Bilateral) BILATERAL TISSUE EXPANDERS WITH POSSIBLE USE OF FLEX H D FOR RECONSTRUCTION (Bilateral), and removal of power port as a surgical intervention .  The patient's history has been reviewed, patient examined, no change in status, stable for surgery.  I have reviewed the patient's chart and labs.  Questions were answered to the patient's satisfaction.     Tiffannie Sloss H

## 2013-09-30 NOTE — Op Note (Signed)
09/30/2013  12:36 PM  PATIENT:  Janet [name], 58 y.o., female, MRN: [id number]  PREOP DIAGNOSIS:  right breast cancer, BRCA 1 positive   POSTOP DIAGNOSIS:   right breast cancer, BRCA 1 positive, 10 o'clock position (T2, N0 - before chemo therapy), completed chemotherapy with grossly no obvious tumor  PROCEDURE:   Procedure(s):  BILATERAL MASTECTOMIES, RIGHT  AXILLARY LYMPH NODE BIOPSY,  REMOVAL PORT-A-CATH (Dr. Harlow Mares did the [place])  SURGEON:   [name], M.D.  ASSISTANT:   None  ANESTHESIA:   general  Anesthesiologist: Finis Bud, MD CRNA: Clearnce Sorrel, CRNA; Rogers Blocker, CRNA; Rokoshi T Flowers, Contractor  ASA:  2  EBL:  < 100  ml  BLOOD ADMINISTERED: none  DRAINS: none   SPECIMEN:   Left breast (long suture lateral, short suture cranial), right breast (long suture lateral, short suture cranial), right axillary sentinel lymph node (counts 4200, back ground 50)  COUNTS CORRECT:  YES  INDICATIONS FOR PROCEDURE:  Janet [name] is a 58 y.o. (DOB: Dec 21, 1955) white  female whose primary care physician is Osborne Casco, MD and comes for bilateral mastectomy and right axillary sentinel lymph biopsy for right breast cancer and BRCA1 positive.  Dr. Harlow Mares will do immediate reconstruction.   The indications and risks of the surgery were explained to the patient.  The risks include, but are not limited to, infection, bleeding, and nerve injury.  In the preop area, her right breast was injected with 1 millicuri of technitium sulfa colloid.  OPERATIVE NOTE;  The patient was taken to room # 9 at [facility] where she underwent a general anesthesia  supervised by Anesthesiologist: Finis Bud, MD CRNA: Clearnce Sorrel, CRNA; Rogers Blocker, CRNA; Rokoshi T Flowers, Immunologist. Both breast and axilla were prepped with  ChloraPrep and sterilely draped.  She was given Ancef at the beginning of the operation.   A time-out and the surgical  check list was reviewed.    I made an circular incision around the areola in the left breast.  So I tried to do this as a skin sparing mastectomy.  I developed skin flaps medially to the lateral edge of the sternum, inferiorly to the investing fascia of the rectus abdominus muscle, laterally to the anterior edge of the latissimus dorsi muscle, and superiorly to about 2 finger breaths below the clavicle.  The breast was reflected off the pectoralis muscle from medial to lateral.  The lateral attachments in the left axilla were divided and the breast removed.  A long suture was placed on the lateral aspect of the breast and short suture on the cranial part of the mastectomy specimen.  At the upper most dissection on the left, I identified and removed the left subclavian power port.  A sponge was left in the wound while I worked on the right side.   I then turned my attention to the right breast.  II made an circular incision around the areola in the right breast.  So I tried to do this as a skin sparing mastectomy.  I developed skin flaps medially to the lateral edge of the sternum, inferiorly to the investing fascia of the rectus abdominus muscle, laterally to the anterior edge of the latissimus dorsi muscle, and superiorly to about 2 finger breaths below the clavicle.  The breast was reflected off the pectoralis muscle from medial to lateral.  The lateral attachments in the right axilla were divided and the breast removed.  A  long suture was placed on the lateral aspect of the breast and short suture on the cranial part of the mastectomy specimen.   I dissected into the right axilla and found a sentinel lymph node.  I was able to get to this through my mastectomy incision.  The node had counts of 4,200 with a background count of 50.  This was sent as a separate specimen.   At this point, Dr. Harlow Mares scrubbed in.  He plans bilateral breast reconstruction.  He will dictate that part of the operation.  [name], MD, [facility] Pager: [phone] Office phone:  [phone]

## 2013-09-30 NOTE — Preoperative (Signed)
Beta Blockers   Reason not to administer Beta Blockers:Not Applicable 

## 2013-09-30 NOTE — Brief Op Note (Signed)
09/30/2013  1:33 PM  PATIENT:  [name]  58 y.o. female  PRE-OPERATIVE DIAGNOSIS:  right breast cancer, BRCA 1 positive   POST-OPERATIVE DIAGNOSIS:  right breast cancer, BRCA 1 positive   PROCEDURE:  Procedure(s): BILATERAL MASTECTOMIES RIGHT  AXILLARY LYMPH NODE BIOPSY (Bilateral) BILATERAL TISSUE EXPANDERS  (Bilateral) REMOVAL PORT-A-CATH (Left)  SURGEON:  Surgeon(s) and Role: Panel 1:    * Shann Medal, MD - Primary  Panel 2:    * Crissie Reese, MD - Primary  PHYSICIAN ASSISTANT:   ASSISTANTS: Judyann Munson, RNFA   ANESTHESIA:   general  EBL:  Total I/O In: 2300 [I.V.:2300] Out: 100 [Urine:100]  BLOOD ADMINISTERED:none  DRAINS: (4) Jackson-Pratt drain(s) with closed bulb suction in the right chest (2) and left chest (2)   LOCAL MEDICATIONS USED:  NONE  SPECIMEN:  No Specimen  DISPOSITION OF SPECIMEN:  N/A  COUNTS:  YES  TOURNIQUET:  * No tourniquets in log *  DICTATION: .Other Dictation: Dictation Number [id number]  PLAN OF CARE: Admit for overnight observation  PATIENT DISPOSITION:  PACU - hemodynamically stable.   Delay start of Pharmacological VTE agent (>24hrs) due to surgical blood loss or risk of bleeding: no (she had heparin subcu pre-op)

## 2013-10-01 ENCOUNTER — Encounter (HOSPITAL_COMMUNITY): Payer: Self-pay | Admitting: Surgery

## 2013-10-01 MED ORDER — SULFAMETHOXAZOLE-TMP DS 800-160 MG PO TABS: 1.0000 | ORAL_TABLET | Freq: Two times a day (BID) | ORAL | Status: DC

## 2013-10-01 MED ORDER — ENOXAPARIN SODIUM 40 MG/0.4ML ~~LOC~~ SOLN
40.0000 mg | SUBCUTANEOUS | Status: DC
  Administered 2013-10-01: 40 mg via SUBCUTANEOUS
  Filled 2013-10-01: qty 0.4

## 2013-10-01 MED ORDER — ENOXAPARIN SODIUM 40 MG/0.4ML ~~LOC~~ SOLN: 40.0000 mg | SUBCUTANEOUS | Status: DC

## 2013-10-01 MED ORDER — HYDROMORPHONE HCL 2 MG PO TABS: 2.0000 mg | ORAL_TABLET | ORAL | Status: DC | PRN

## 2013-10-01 MED ORDER — HYDROMORPHONE HCL 2 MG PO TABS
2.0000 mg | ORAL_TABLET | ORAL | Status: DC | PRN
  Administered 2013-10-01: 2 mg via ORAL
  Filled 2013-10-01: qty 1

## 2013-10-01 MED ORDER — METHOCARBAMOL 500 MG PO TABS
500.0000 mg | ORAL_TABLET | Freq: Four times a day (QID) | ORAL | Status: DC
Start: 2013-10-01 — End: 2013-11-07

## 2013-10-01 MED ORDER — DSS 100 MG PO CAPS: 100.0000 mg | ORAL_CAPSULE | Freq: Every day | ORAL | Status: DC

## 2013-10-01 MED ORDER — SULFAMETHOXAZOLE-TMP DS 800-160 MG PO TABS
1.0000 | ORAL_TABLET | Freq: Two times a day (BID) | ORAL | Status: DC
  Administered 2013-10-01: 1 via ORAL
  Filled 2013-10-01: qty 1

## 2013-10-01 NOTE — Discharge Instructions (Addendum)
No lifting for 6 weeks No vigorous activity for 6 weeks (including outdoor walks) No driving for 4 weeks No raising arms overhead and no pulling with arms OK to walk up stairs slowly Stay propped up Use incentive spirometer at home every hour while awake No shower while drains are in place Empty drains at least three times a day and record the amounts separately Change drain dressings every third day if instructed to do so by Dr. Harlow Mares (no need to do this until after return to Dr. Harlow Mares at his office)  Apply Bacitracin antibiotic ointment to the drain sites  Place gauze dressing over drains  Secure the gauze with tape Take an over-the-counter Probiotic while on antibiotics Take an over-the-counter stool softener (such as Colace) while on pain medication See Dr. Harlow Mares next week as scheduled For questions call [phone] or [phone]

## 2013-10-01 NOTE — Progress Notes (Signed)
DC home with husband, verbally understood DC instructions, instructed on drain care, pt emptied drains at DC, pt understood lovenox and gave to self this am, supplies sent home with pt.

## 2013-10-01 NOTE — Op Note (Signed)
NAMEMALU, PELLEGRINI                ACCOUNT NO.:  [id number]  MEDICAL RECORD NO.:  [id number]  LOCATION:                                 FACILITY:  PHYSICIAN:  Crissie Reese, M.D.     DATE OF BIRTH:  05-09-1956  DATE OF PROCEDURE:  09/30/2013 DATE OF DISCHARGE:  10/01/2013                              OPERATIVE REPORT   PREOPERATIVE DIAGNOSIS:  Breast cancer.  POSTOPERATIVE DIAGNOSIS:  Breast cancer.  PROCEDURE PERFORMED:  Bilateral breast reconstruction with tissue expanders.  SURGEON:  Crissie Reese, M.D.  ASSISTANT:  Judyann Munson.  ANESTHESIA:  General.  ESTIMATED BLOOD LOSS:  25 mL.  DRAINS:  Two 19-French on each side.  CLINICAL NOTE:  A 58 year old woman has breast cancer on the right side, has completed her neoadjuvant chemotherapy a month ago.  She presents for bilateral mastectomy and is interested in reconstruction.  Options were discussed and she selected tissue expander placement as a planned staged procedure for eventual placement of implants.  Petra Kuba of this procedure and risks plus complications discussed with her in detail. These risks include, but not limited to, bleeding, infection, healing problems, scarring, loss of sensation, fluid accumulations, anesthesia complications, pneumothorax, DVT, PE, failure of device, capsular contracture, displacement of device, wrinkles and ripples, contour deformities, contour deformities of the periphery of the expander and reconstruction, chronic pain, asymmetry, and disappointment.  She understood all of these and wished to proceed.  DESCRIPTION OF PROCEDURE:  The patient was in the operating room and bilateral mastectomy had been completed by General Surgery.  Thorough irrigation with saline and antibiotic solution was also used.  The submuscular space was developed into the lateral border pectoralis major muscle, and then elevating the serratus anterior for a very short distance as well.  Dissection was  continued inferiorly with great care taken to avoid damage to the underlying chest cavity.  The dissection was continued down to the level of the inframammary fold bilateral and then to the dimensions of the implant.  Thorough irrigation with saline as well as antibiotic solution, meticulous hemostasis with electrocautery.  After thoroughly cleaning gloves, the expanders were prepared.  These were mentor 350 mL expanders, moderate height, and the 100 mL sterile saline placed using closed filling system and expanders were soaked in antibiotic solution for greater than 5 minutes. Antibiotic solution also placed in the submuscular space.  Careful inspection revealed excellent hemostasis.  The expanders were positioned.  Care was taken to make sure there were oriented properly without any folds or creases and wrinkles and the antibiotic solution again placed in the expanders and the muscle closure with 3-0 Vicryl interrupted sutures with great care taken to avoid damage to the underlying implants which were kept under direct vision at all times. An additional 100 mL sterile saline placed using a closed filling system and this brought total to 200 mL bilateral.  Again, excellent hemostasis was confirmed.  Two 19-French drains were positioned, 1 brought out inferolateral and the other one inferomedial and secured with 3-0 Prolene sutures.  The skin closure with a few 3-0 Monocryl inverted deep dermal sutures and 3-0 Prolene simple interrupted sutures.  Biopatch and Tegaderm for  the drains, dressings, and then Telfa and ABD and the large breast binder to avoid any excessive pressure on the skin and she was transferred to the recovery room in stable having tolerated procedure well.     Crissie Reese, M.D.     DB/MEDQ  D:  09/30/2013  T:  10/01/2013  Job:  [id number]

## 2013-10-01 NOTE — Progress Notes (Signed)
General Surgery Note  LOS: 1 day  POD -  1 Day Post-Op  Assessment/Plan: 1.  BILATERAL MASTECTOMIES, RIGHT AXILLARY LYMPH NODE BIOPSY, BILATERAL TISSUE EXPANDERS, REMOVAL PORT-A-CATH - 09/30/2013 - Menna Abeln/Bowers  Drains 1/2/3/4 - 60/60/130/135  She is doing very well.   Okay to go home from my standpoint.  2. 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  3. History of migraines.   Active Problems:   Breast cancer  Subjective:  Up walking in the hall.  Slept well last PM.  She has a little bit of a headache, but has very little complaints about chest. Objective:   Filed Vitals:   10/01/13 0450  BP: 157/55  Pulse: 63  Temp: 98.3 F (36.8 C)  Resp: 16     Intake/Output from previous day:  03/09 0701 - 03/10 0700 In: 3880 [P.O.:480; I.V.:3400] Out: 1685 [Urine:1300; Drains:385]  Intake/Output this shift:      Physical Exam:   General: Thin WF who is alert and oriented.    HEENT: Normal. Pupils equal. .   Lungs: Clear   Wound: Skin looks good at mastectomy site.   Drains #1/2/3/4 - 60/60/130/135   Extremities:  Arm movement good.   Lab Results:   No results found for this basename: WBC, HGB, HCT, PLT,  in the last 72 hours  BMET  No results found for this basename: NA, K, CL, CO2, GLUCOSE, BUN, CREATININE, CALCIUM,  in the last 72 hours  PT/INR  No results found for this basename: LABPROT, INR,  in the last 72 hours  ABG  No results found for this basename: PHART, PCO2, PO2, HCO3,  in the last 72 hours   Studies/Results:  Nm Sentinel Node Inj-no Rpt (breast)  09/30/2013   CLINICAL DATA: Right breast cancer, BRCA 1   Sulfur colloid was injected intradermally by the nuclear medicine  technologist for breast cancer sentinel node localization.      Anti-infectives:   Anti-infectives   Start     Dose/Rate Route Frequency Ordered Stop   09/30/13 1945  vancomycin (VANCOCIN) IVPB 750 mg/150 ml premix     750 mg 150 mL/hr over 60 Minutes Intravenous Every 12 hours 09/30/13 1422  10/01/13 1944   09/30/13 1043  neomycin-polymyxin B (NEOSPORIN) 3 mL, bacitracin 50,000 Units in sodium chloride irrigation 0.9 % 1,000 mL irrigation  Status:  Discontinued       As needed 09/30/13 1044 09/30/13 1317   09/30/13 0845  neomycin-polymyxin B (NEOSPORIN) 3 mL, bacitracin 50,000 Units in sodium chloride irrigation 0.9 % 1,000 mL irrigation  Status:  Discontinued      Irrigation  Once 09/30/13 0844 09/30/13 1418   09/30/13 0600  vancomycin (VANCOCIN) IVPB 1000 mg/200 mL premix     1,000 mg 200 mL/hr over 60 Minutes Intravenous On call to O.R. 09/29/13 1426 09/30/13 0745   09/30/13 0600  ceFAZolin (ANCEF) IVPB 2 g/50 mL premix     2 g 100 mL/hr over 30 Minutes Intravenous On call to O.R. 09/29/13 1426 09/30/13 9311      [name], MD, FACS Pager: [facility] Office: [phone] 10/01/2013

## 2013-10-01 NOTE — Discharge Summary (Signed)
Physician Discharge Summary  Patient ID: Janet [name] MRN: [id number] DOB/AGE: January 12, 1956 58 y.o.  Admit date: 09/30/2013 Discharge date: 10/01/2013  Admission Diagnoses:Breast cancer  Discharge Diagnoses: Same Active Problems:   Breast cancer   Discharged Condition: good  Hospital Course: On the day of admission the patient was taken to surgery and had bilateral mastectomy with left sentinel node and port removal and reconstruction with tissue expanders. The patient tolerated the procedures well. Postoperatively, the mastectomy flaps maintained excellent color and capillary refill. The patient was ambulatory and tolerating diet on the first postoperative day. She would like to go home.  Treatments: antibiotics: vancomycin, anticoagulation: LMW heparin and surgery: bilateral mastectomy, left sentinel node, port removal, and reconstruction with bilateral tissue expanders.  Discharge Exam: Blood pressure 157/55, pulse 63, temperature 98.3 F (36.8 C), temperature source Oral, resp. rate 16, height 5' 8.5" (1.74 m), weight 113 lb (51.256 kg), SpO2 100.00%.  Operative sites: Mastectomy flaps viable. Tissue expanders appear in good position. Drains functioning. Drainage thin.  Disposition: 01-Home or Self Care   Future Appointments Provider Department Dept Phone   10/16/2013 4:30 PM Chauncey Cruel, [facility] [phone]   10/22/2013 2:00 PM Shann Medal, MD [facility] [place]   11/07/2013 2:30 PM [facility] [phone]   11/07/2013 3:00 PM Terrance Mass, MD [facility] [phone]       Medication List    STOP taking these medications       SUMAtriptan 50 MG tablet  Commonly known as:  IMITREX      TAKE these medications       acetaminophen 325 MG tablet  Commonly known as:  TYLENOL  Take 650 mg by mouth every 6 (six) hours as needed for  pain.     DSS 100 MG Caps  Take 100 mg by mouth daily.     enoxaparin 40 MG/0.4ML injection  Commonly known as:  LOVENOX  Inject 0.4 mLs (40 mg total) into the skin daily.     HYDROmorphone 2 MG tablet  Commonly known as:  DILAUDID  Take 1-2 tablets (2-4 mg total) by mouth every 4 (four) hours as needed for severe pain.     methocarbamol 500 MG tablet  Commonly known as:  ROBAXIN  Take 1 tablet (500 mg total) by mouth 4 (four) times daily.     MULTI-VITAMIN DAILY PO  Take 1 tablet by mouth daily.     sulfamethoxazole-trimethoprim 800-160 MG per tablet  Commonly known as:  BACTRIM DS  Take 1 tablet by mouth every 12 (twelve) hours.         SignedHarlow Mares, Ireoluwa Grant M 10/01/2013, 8:31 AM

## 2013-10-03 ENCOUNTER — Encounter: Payer: BC Managed Care – PPO | Admitting: Genetic Counselor

## 2013-10-16 ENCOUNTER — Ambulatory Visit (HOSPITAL_BASED_OUTPATIENT_CLINIC_OR_DEPARTMENT_OTHER): Payer: BC Managed Care – PPO | Admitting: Oncology

## 2013-10-16 VITALS — BP 116/68 | HR 74 | Temp 98.3°F | Resp 18 | Ht 68.5 in | Wt 117.1 lb

## 2013-10-16 DIAGNOSIS — C50119 Malignant neoplasm of central portion of unspecified female breast: Secondary | ICD-10-CM

## 2013-10-16 DIAGNOSIS — Z1509 Genetic susceptibility to other malignant neoplasm: Secondary | ICD-10-CM

## 2013-10-16 DIAGNOSIS — M949 Disorder of cartilage, unspecified: Secondary | ICD-10-CM

## 2013-10-16 DIAGNOSIS — Z170 Estrogen receptor positive status [ER+]: Secondary | ICD-10-CM

## 2013-10-16 DIAGNOSIS — C50411 Malignant neoplasm of upper-outer quadrant of right female breast: Secondary | ICD-10-CM

## 2013-10-16 DIAGNOSIS — Z1501 Genetic susceptibility to malignant neoplasm of breast: Secondary | ICD-10-CM

## 2013-10-16 DIAGNOSIS — M899 Disorder of bone, unspecified: Secondary | ICD-10-CM

## 2013-10-16 NOTE — Progress Notes (Signed)
Patient ID: Janet [name], female   DOB: 02/22/1956, 58 y.o.   MRN: [id number] ID: Janet [name] OB: 08/25/1955  MR#: [id number]  CSN#:[id number]  PCP: Janet Casco, MD GYN:  Dr. Uvaldo [name];  Dr. Donalynn [name] SU: Janet [name]; Janet [name] OTHER MD: Janet [name], Janet [name];  Janet Reese, MD  CHIEF COMPLAINTS:   "I had my final surgery"  BREAST CANCER HISTORY: : "Janet [name]" [name] noted a lump in her right breast on 03/17/2013, and immediately contacted her physician so that on 03/18/2013 bilateral diagnostic mammography and right breast ultrasonography at the breast Center showed a developing mass in the subareolar region of the right breast. There were no malignant type calcifications. This mass was palpable in the upper outer quadrant, and by ultrasound measured 2.6 cm. Ultrasonography of the right axilla showed a lymph node measuring 1.1 cm, with a thickened cortex. The left breast was unremarkable.  Biopsy of the right breast mass 08/26//2014 showed ([name]) and invasive ductal carcinoma, grade 3, triple negative, with an MIB-1 of 54%. Biopsy of the right axillary lymph node in question at the same time was negative.  Bilateral breast MRI 03/26/2013 measured the right subareolar mass at 3.8 cm. The previously biopsied benign right axillary lymph node was again noted. There were no other suspicious areas in the right breast or axilla and the left breast was unremarkable.  Her subsequent history is as detailed below  INTERVAL HISTORY: Janet [name] returns alone today for followup of her locally advanced right breast cancer accompanied by her husband Janet [name].. Since her last visit here she underwent bilateral mastectomies 09/30/2013. This showed, on the left, no evidence of malignancy. On the right, there was a focus of residual invasive ductal carcinoma measuring 2 mm. Grade was read off the earlier biopsy as there was insufficient tissue in this sample (therefore the  "grade III" designation may be misleading). 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 A single sentinel lymph node on the right was negative.  Repeat HER-2 was again negative. However the repeat estrogen and progesterone receptors in the invasive tumor showed estrogen receptor positivity at 46%, progesterone receptor negative.  Janet [name] had bilateral expanders placed at the same time as the surgery. She is working closely with Janet [name] and plans to complete the implant placement this summer, at the same time as her bilateral salpingo-oophorectomy  REVIEW OF SYSTEMS: Janet [name] has not really started gaining her weight back, but at least she is not losing any more. She continues to have some taste progression. She has some nail dyscrasia and likely will lose both her big toe nails. Her appetite is "not great", but there is no nausea or vomiting. GI function is otherwise fine. Her hair has begun to grow back on her head. She still doesn't have any eyelashes. Of course she continues to work full-time and to exercise regularly. A detailed review of systems today was otherwise noncontributory  PAST MEDICAL HISTORY: Past Medical History  Diagnosis Date  . Migraines   . Cancer     breast  . Breast cancer   . Allergy   . BRCA1 positive 2015  . Anemia, unspecified 07/09/2013    chemo    PAST SURGICAL HISTORY: Past Surgical History  Procedure Laterality Date  . Removal of cataracts    . Foot surgery  age 17    bone graft  . Portacath placement N/A 04/04/2013    Procedure: INSERTION PORT-A-CATH;  Surgeon: Janet Lasso, MD;  Location: [facility];  Service: General;  Laterality: N/A;  . Mastectomy with axillary lymph node dissection Bilateral 09/30/2013    DR Janet [name]  . Mastectomy with axillary lymph node dissection Bilateral 09/30/2013    Procedure: BILATERAL MASTECTOMIES RIGHT  AXILLARY LYMPH NODE BIOPSY;  Surgeon: Janet Medal, MD;  Location: [facility];  Service:  General;  Laterality: Bilateral;  . Port-a-cath removal Left 09/30/2013    Procedure: REMOVAL PORT-A-CATH;  Surgeon: Janet Medal, MD;  Location: [facility];  Service: General;  Laterality: Left;  . Breast reconstruction with placement of tissue expander and flex hd (acellular hydrated dermis) Bilateral 09/30/2013    Procedure: BILATERAL TISSUE EXPANDERS ;  Surgeon: Janet Reese, MD;  Location: [facility];  Service: Plastics;  Laterality: Bilateral;    FAMILY HISTORY Family History  Problem Relation Age of Onset  . Breast cancer Maternal Grandmother 68  . Pancreatic cancer Father 43  . Heart Problems Maternal Uncle   . Ovarian cancer Paternal Aunt     dx in her 91s-60s  . Stomach cancer Other     father's maternal grandmother  . Breast cancer Cousin     Father's maternal cousin's daughter; Janet [name]  the patient's father died at the age of 6 with pancreatic cancer. (He was Janet [name]). [name] is alive at age 67. Janet [name] has one brother, no sisters. [name]'s mother died from breast cancer at the age of 78. There is no history of ovarian cancer in the family.-- The patient has advised her children and the rest of her family regarding her being BRCA+  GYNECOLOGIC HISTORY:  Menarche age 45, first live birth age 33. She is GX P2. She stopped having periods approximately 2004. She did not take hormone replacement. She did take birth control pills remotely for 5-10 years, with no complications.  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 Her husband the Janet [name]. AQuentin Cornwall "Robby" [name], is a local judge. The patient's 2 children are Janet [name] who lives in [place] and works as a Public affairs consultant, and Janet [name], who works in [place] and is an associate with a  Interior and spatial designer group.   ADVANCED DIRECTIVES: In place.  Patient was also given information about advanced directives today so that she may update her healthcare power of attorney. (09/03/2013)   HEALTH MAINTENANCE: (Updated February 2015) History  Substance Use Topics  . Smoking status: Never Smoker   . Smokeless tobacco: Never Used  . Alcohol Use: Yes     Comment: occasional     Colonoscopy: Not on file  PAP:  Oct 2014, Dr. Laurann Montana  Bone density: July 2013 at [facility], showed osteopenia (T-1.9)  Lipid panel: UTD, Dr. Laurann Montana   Allergies  Allergen Reactions  . Azithromycin Hives  . Penicillins Rash    Current Outpatient Prescriptions  Medication Sig Dispense Refill  . acetaminophen (TYLENOL) 325 MG tablet Take 650 mg by mouth every 6 (six) hours as needed for pain.      Marland Kitchen docusate sodium 100 MG CAPS Take 100 mg by mouth daily.  10 capsule  0  . enoxaparin (LOVENOX) 40 MG/0.4ML injection Inject 0.4 mLs (40 mg total) into the skin daily.  12 Syringe  0  . HYDROmorphone (  DILAUDID) 2 MG tablet Take 1-2 tablets (2-4 mg total) by mouth every 4 (four) hours as needed for severe pain.  30 tablet  0  . methocarbamol (ROBAXIN) 500 MG tablet Take 1 tablet (500 mg total) by mouth 4 (four) times daily.  40 tablet  0  . Multiple Vitamin (MULTI-VITAMIN DAILY PO) Take 1 tablet by mouth daily.       Marland Kitchen sulfamethoxazole-trimethoprim (BACTRIM DS) 800-160 MG per tablet Take 1 tablet by mouth every 12 (twelve) hours.  20 tablet  0   No current facility-administered medications for this visit.   Facility-Administered Medications Ordered in Other Visits  Medication Dose Route Frequency Provider Last Rate Last Dose  . sodium chloride 0.9 % injection 10 mL  10 mL Intracatheter PRN Theotis Burrow, PA-C   10 mL at 09/04/13 1652    OBJECTIVE: Middle-aged white woman in no acute distress Filed Vitals:   10/16/13 1638  BP: 116/68  Pulse: 74  Temp: 98.3 F (36.8 C)  Resp: 18     Body  mass index is 17.54 kg/(m^2).    ECOG FS:1 Filed Weights   10/16/13 1638  Weight: 117 lb 1.6 oz (53.116 kg)  (baseline weight was 122 pounds.)  Sclerae unicteric, status post bilateral cataract surgery Oropharynx clear, dentition in good repair No cervical or supraclavicular adenopathy Lungs no rales or rhonchi Heart regular rate and rhythm Abd soft, nontender, positive bowel sounds MSK no focal spinal tenderness, no upper extremity lymphedema Neuro: nonfocal, well oriented, positive affect Breasts: Status post bilateral mastectomies with expander is in place. The first impression is symmetrical and good. The incisions are healing nicely. Both axillae are benign Skin: The large toenails in particular are discolored, raised, and a bit loose. There are minor changes in other toenails    LAB RESULTS:  CMP     Component Value Date/Time   NA 141 09/24/2013 1421   NA 141 09/17/2013 1324   K 4.2 09/24/2013 1421   K 3.6 09/17/2013 1324   CL 104 09/24/2013 1421   CO2 28 09/24/2013 1421   CO2 24 09/17/2013 1324   GLUCOSE 75 09/24/2013 1421   GLUCOSE 89 09/17/2013 1324   BUN 13 09/24/2013 1421   BUN 15.0 09/17/2013 1324   CREATININE 0.73 09/24/2013 1421   CREATININE 0.7 09/17/2013 1324   CALCIUM 10.5 09/24/2013 1421   CALCIUM 10.4 09/17/2013 1324   PROT 6.8 09/17/2013 1324   ALBUMIN 4.4 09/17/2013 1324   AST 22 09/17/2013 1324   ALT 25 09/17/2013 1324   ALKPHOS 63 09/17/2013 1324   BILITOT 0.52 09/17/2013 1324   GFRNONAA >90 09/24/2013 1421   GFRAA >90 09/24/2013 1421     Lab Results  Component Value Date   WBC 2.7* 09/24/2013   NEUTROABS 1.2* 09/17/2013   HGB 12.9 09/24/2013   HCT 36.7 09/24/2013   MCV 97.6 09/24/2013   PLT 138* 09/24/2013      STUDIES: Nm Sentinel Node Inj-no Rpt (breast)  09/30/2013   CLINICAL DATA: Right breast cancer, BRCA 1   Sulfur colloid was injected intradermally by the nuclear medicine  technologist for breast cancer sentinel node localization.      ASSESSMENT: 58 y.o. BRCA1  positive [place] woman   (1)  status post right breast biopsy 03/19/2013 for a clinical T2 N0, stage IIA invasive ductal breast cancer, grade 3, triple negative, with an MIB-1 of 54%.  (2) suspicious right axillary lymph node biopsy negative 03/19/2013  (3) received four cycles of  dose dense doxorubicin/ cyclophosphamide between 04/17/2013 and 05/29/2013 , at standard doses, with Neulasta support;  (4) on  06/12/2013, started 12 weekly doses of carboplatin/ paclitaxel.  One treatment was held in January secondary to chemotherapy-induced neutropenia, and that cycle was not "made up". Accordingly, patient received a total of 11 weekly doses, with final dose on 09/04/2013.  (5) s/p bilateral mastectomies with immediate expander placement and right-sided sentinel lymph node sampling 09/30/2013 for a residual ypT1a ypN0 invasive ductal carcinoma, grade not ascertain a mole on this sample (red off original biopsy), repeat prognostic panel again progesterone and HER-2 negative, but now estrogen receptor positive at 46% with moderate staining intensity.  (6) anticipates bilateral salpingo-oophorectomy under Dr. Toney Rakes Summer 2015 with concurrent implant placement  PLAN: Janet [name] has had an excellent reaction to chemotherapy. We spent well over 45 minutes today going over her current status. This surviving breast cancer cells were a less aggressive, estrogen receptor positive subclone. Of course we were not previously aware that this subclone existed. The question now is whether she would benefit from tamoxifen. This is a very difficult question to answer. There is no indication for tamoxifen on a preventative basis, since she has no residual breast tissue. Generally the use of anti-estrogens with pT1a tumors is optional, as the risk of occult spread is minimal.  However that is not quite her situation and I am not able to give her a specific percentage risk of the estrogen receptor positive subclone  being still present somewhere in her body. We did review her bone density, which shows moderate osteopenia, and on that basis it might not be unreasonable for her to take tamoxifen, which improves bone density much as raloxifene does. We then discussed the possible toxicities, side effects and complications of tamoxifen.  She will think about this, but initially decides to do that I would prefer to wait about 6 months, until she is completely done with the bilateral salpingo-oophorectomy and recovered from the plastic reconstruction. She is going to see me again in 6 months from now and we will take up the issue at that time. We will also initiate routine followup that.  Janet [name] is a good understanding of the [name] plan. She agrees with it. She knows to call for any problems that may develop before her next visit here.

## 2013-10-17 ENCOUNTER — Telehealth: Payer: Self-pay | Admitting: *Deleted

## 2013-10-17 NOTE — Telephone Encounter (Signed)
Per 3/25 POF I have scheduled appts for patient. I have called and left message to call the office. CAlendar mailed

## 2013-10-17 NOTE — Addendum Note (Signed)
Addended by: Laureen Abrahams on: 10/17/2013 05:30 PM   Modules accepted: Orders, Medications

## 2013-10-22 ENCOUNTER — Encounter (INDEPENDENT_AMBULATORY_CARE_PROVIDER_SITE_OTHER): Payer: BC Managed Care – PPO | Admitting: Surgery

## 2013-10-23 ENCOUNTER — Encounter (INDEPENDENT_AMBULATORY_CARE_PROVIDER_SITE_OTHER): Payer: Self-pay | Admitting: Surgery

## 2013-10-23 ENCOUNTER — Encounter (INDEPENDENT_AMBULATORY_CARE_PROVIDER_SITE_OTHER): Payer: BC Managed Care – PPO | Admitting: Surgery

## 2013-10-23 ENCOUNTER — Ambulatory Visit (INDEPENDENT_AMBULATORY_CARE_PROVIDER_SITE_OTHER): Payer: BC Managed Care – PPO | Admitting: Surgery

## 2013-10-23 VITALS — BP 122/62 | HR 66 | Temp 98.0°F | Resp 18 | Ht 68.5 in | Wt 117.0 lb

## 2013-10-23 DIAGNOSIS — C50419 Malignant neoplasm of upper-outer quadrant of unspecified female breast: Secondary | ICD-10-CM

## 2013-10-23 DIAGNOSIS — C50411 Malignant neoplasm of upper-outer quadrant of right female breast: Secondary | ICD-10-CM

## 2013-10-23 NOTE — Progress Notes (Signed)
Re:   Janet [name] DOB:   February 07, 1956 MRN:   [id number]  ASSESSMENT AND PLAN: 1.  Right breast cancer, T2, N0 - 11 o'clock  Grade 3, triple negative invasive ductal carcinoma.  Ki67 - 54%  [place] - 03/19/2014- right breast 11 o'clock in a subareolar location measuring 2.6 x 1.7 x 2.4 cm.  MRI - 03/20/2013 - 3.8 x 2.9 x 2.5 cm right breast mass.  Oncology - [facility]  Underwent neoadjuvant chemotx by Dr. Jana Hakim. Four cycles of doxorubicin/cyclophosphamide, followed by 12 weekly doses of carboplatin/paclitaxel.    Janet [name] had bilateral mastectomies with reconstruction by Drs. Nikodem Leadbetter/Bowers on 09/30/2013.  Her final pathology showed only 0.2 cm of residual invasive grade III ductal ca and 0/1 node. (ypT1a, ypN0)  Her wounds look good.  Janet [name]'ll see me back in 6 months.  2.  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    Janet [name] plans bilateral oophorectomy this summer with [facility].  This will be timed with switching out her expanders. 3.  History of migraines.  Chief Complaint  Patient presents with  . Routine Post Op   REFERRING PHYSICIAN: Osborne Casco, MD  HISTORY OF PRESENT ILLNESS: Janet [name] is a 58 y.o. (DOB: 05/30/1956)  white  female whose primary care physician is Osborne Casco, MD and comes to me today for follow up of a right breast cancer  Janet [name] comes by herself. Janet [name] is doing well.  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  Janet [name] wants to push the exercise, but I have deferred her to Dr. Harlow Mares about the limits of her activity.  I told her that his limits would be different than mine. We talked about Janet [name] going to the [facility] in [place]. We also talked about the final path that did show a cluster of cells that is ER positive.  For now, Janet [name] is deferring a decision about anti-estrogen therapy.   Janet [name] and Dr. Jana Hakim are going to discuss this later.  History of breast cancer (9/20150: Janet [name] felt a right breast mass late summer of 2014.  This prompted a biopsy of her right breast biopsy  ([name]) on 03/19/2013 which showed grade 3, triple negative invasive ductal carcinoma.  Ki67-54%.  A core right axillary lymph node biopsy at the same time was negative.  MRI 03/20/2013 showed an enhancing right breast mass which measures 3.8 x 2.9 x 2.5 cm.   Janet [name] saw Dr. Jana Hakim, Chilton Greathouse, and Cristela Felt in the [facility] on 03/27/2013.  The plan was neoadjuvant chemotx and see how things go.  Janet [name] had a power port placed by Dr. Margot Chimes 04/04/2013.  And Janet [name] has received doxorubicin/cyclophosphamide 4 cycles followed by carboplaten/pacletaxel x 12 doses.  The mass has shown improvement by MRI done 06/07/2013, but the patient still feels a mass that is about the same..  Janet [name] did undergo genetic testing.  Janet [name] has a cousin who is BRCA 1 positive and Janet [name] has seen Roma Kayser and her genetic test is pending.  Janet [name]'s father's maternal first cousin has two daughters who have tested positive for a BRCA1 mutation.   Past Medical History  Diagnosis Date  . Migraines   . Cancer     breast  . Breast cancer   . Allergy   . BRCA1 positive 2015  . Anemia, unspecified 07/09/2013    chemo    Past Surgical History  Procedure Laterality Date  . Removal of cataracts    . Foot surgery  age 43    bone graft  .  Portacath placement N/A 04/04/2013    Procedure: INSERTION PORT-A-CATH;  Surgeon: Haywood Lasso, MD;  Location: [facility];  Service: General;  Laterality: N/A;  . Mastectomy with axillary lymph node dissection Bilateral 09/30/2013    DR BOWERS  . Mastectomy with axillary lymph node dissection Bilateral 09/30/2013    Procedure: BILATERAL MASTECTOMIES RIGHT  AXILLARY LYMPH NODE BIOPSY;  Surgeon: Shann Medal, MD;  Location: [facility];  Service: General;  Laterality: Bilateral;  . Port-a-cath removal Left 09/30/2013    Procedure: REMOVAL PORT-A-CATH;  Surgeon: Shann Medal, MD;  Location: [facility];  Service: General;  Laterality: Left;  . Breast reconstruction with placement of tissue expander and flex hd  (acellular hydrated dermis) Bilateral 09/30/2013    Procedure: BILATERAL TISSUE EXPANDERS ;  Surgeon: Crissie Reese, MD;  Location: [facility];  Service: Plastics;  Laterality: Bilateral;     Current Outpatient Prescriptions  Medication Sig Dispense Refill  . acetaminophen (TYLENOL) 325 MG tablet Take 650 mg by mouth every 6 (six) hours as needed for pain.      . methocarbamol (ROBAXIN) 500 MG tablet Take 1 tablet (500 mg total) by mouth 4 (four) times daily.  40 tablet  0  . Multiple Vitamin (MULTI-VITAMIN DAILY PO) Take 1 tablet by mouth daily.        No current facility-administered medications for this visit.   Facility-Administered Medications Ordered in Other Visits  Medication Dose Route Frequency Provider Last Rate Last Dose  . sodium chloride 0.9 % injection 10 mL  10 mL Intracatheter PRN Amy Milda Smart, PA-C   10 mL at 09/04/13 1652     Allergies  Allergen Reactions  . Azithromycin Hives  . Penicillins Rash  Penicillin allergy was as a child - Janet [name] is unsure, but will check with Mom.  REVIEW OF SYSTEMS: Unremarkable review of systems.  SOCIAL and FAMILY HISTORY: Married.  Husband Heath Lark is a judge. Two daughters - Janet [name] and Janet [name]. 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  PHYSICAL EXAM: There were no vitals taken for this visit.  General: Thin WN WF who is alert.  Janet [name] is wearing a bonnett and has no hair. HEENT: Normal. Pupils equal. Neck: Supple. No mass.  No thyroid mass. Lymph Nodes:  No supraclavicular, cervical, or axillary nodes. Breasts:  Right - Mastectomy with implant.  Janet [name] still has some sutures in the wound, but it looks good.  Left - Mastectomy with implant.  Janet [name] still has some sutures in the wound, but it looks good.  DATA REVIEWED: Epic notes  [name], MD,  [facility], [address]  [place], [place]    [place] Phone:  [phone] FAX:  [phone]

## 2013-11-07 ENCOUNTER — Ambulatory Visit: Payer: BC Managed Care – PPO | Admitting: Gynecology

## 2013-11-07 ENCOUNTER — Ambulatory Visit (INDEPENDENT_AMBULATORY_CARE_PROVIDER_SITE_OTHER): Payer: BC Managed Care – PPO

## 2013-11-07 ENCOUNTER — Ambulatory Visit (INDEPENDENT_AMBULATORY_CARE_PROVIDER_SITE_OTHER): Payer: BC Managed Care – PPO | Admitting: Gynecology

## 2013-11-07 ENCOUNTER — Encounter: Payer: Self-pay | Admitting: Gynecology

## 2013-11-07 ENCOUNTER — Other Ambulatory Visit: Payer: Self-pay | Admitting: Gynecology

## 2013-11-07 ENCOUNTER — Ambulatory Visit: Payer: Self-pay

## 2013-11-07 VITALS — BP 114/74 | Ht 68.5 in | Wt 117.0 lb

## 2013-11-07 DIAGNOSIS — N83339 Acquired atrophy of ovary and fallopian tube, unspecified side: Secondary | ICD-10-CM

## 2013-11-07 DIAGNOSIS — Z1589 Genetic susceptibility to other disease: Secondary | ICD-10-CM

## 2013-11-07 DIAGNOSIS — N858 Other specified noninflammatory disorders of uterus: Secondary | ICD-10-CM

## 2013-11-07 DIAGNOSIS — C50919 Malignant neoplasm of unspecified site of unspecified female breast: Secondary | ICD-10-CM

## 2013-11-07 DIAGNOSIS — C50419 Malignant neoplasm of upper-outer quadrant of unspecified female breast: Secondary | ICD-10-CM

## 2013-11-07 DIAGNOSIS — Z1505 Genetic susceptibility to malignant neoplasm of fallopian tube(s): Secondary | ICD-10-CM

## 2013-11-07 DIAGNOSIS — Z1509 Genetic susceptibility to other malignant neoplasm: Secondary | ICD-10-CM

## 2013-11-07 DIAGNOSIS — N859 Noninflammatory disorder of uterus, unspecified: Secondary | ICD-10-CM

## 2013-11-07 DIAGNOSIS — Z1501 Genetic susceptibility to malignant neoplasm of breast: Secondary | ICD-10-CM

## 2013-11-07 DIAGNOSIS — C50411 Malignant neoplasm of upper-outer quadrant of right female breast: Secondary | ICD-10-CM

## 2013-11-07 DIAGNOSIS — Z853 Personal history of malignant neoplasm of breast: Secondary | ICD-10-CM

## 2013-11-07 NOTE — Progress Notes (Addendum)
   Patient is a 58 year old gravida 2 para 2 (22 normal spontaneous vaginal deliveries) who has not been seen in the office for several years and has been receiving her gynecological care with her medical doctor. In August of 2014 patient felt a lump in her right breast and her PCP and/or bilateral diagnostic mammograms which had shown a mass in the subareolar region of the right breast. The mass had been palpable and the ultrasound demonstrated 2.6 cm mass with a enlarged right lymph node. The left breast is unremarkable.  Patient subsequently on August 26 underwent a right breast biopsy which showed ([name]) and invasive ductal carcinoma, grade 3, triple negative, with an MIB-1 of 54%. Biopsy of the right axillary lymph node in question at the same time was negative. Patient with BRCA1 positive.  She had bilateral breast MRI 03/26/2013 which demonstrated right subareolar mass measuring 3.8 cm.The previously biopsied benign right axillary lymph node was again noted. There were no other suspicious areas in the right breast or axilla and the left breast was unremarkable. Dr. Jana Hakim has been her medical oncologist who counseled and administered 4 cycle dose of doxorubicin/ cyclophosphamide between 04/17/2013 and 05/29/2013 , at standard doses , with Neulasta support;on 06/12/2013, started 12 weekly doses of carboplatin/ paclitaxel. One treatment was held in January secondary to chemotherapy-induced neutropenia, and that cycle was not "made up". Accordingly, patient received a total of 11 weekly doses, with final dose on 09/04/2013.   On 09/30/2013 Dr. [name] had performed bilateral mastectomies, right axillary lymph node biopsy and removal of Port-A-Cath. Dr. Crissie Reese had placed bilateral tissue expanders.  Patient has been staged clinically T2 N0, stage IIA invasive ductal breast cancer, grade 3, triple negative, with an MIB-1 of 54%.  She is here for discussion for prophylactic  laparoscopic bilateral oophorectomy, removal of bilateral tissue expanders by Dr. Harlow Mares on the same setting.  We had discussed today that due to the fact that she was BRCA1 positive that  research studies have reported that for every 1000 women negative for BRCA mutations, between 12 and 59 of them will develop breast cancer by age 11 and between 72 and 4 will develop ovarian cancer by age 85. The risk increases with age. The patient has never been on replacement therapy in the past.  Exam: Abdomen: Soft nontender no rebound guarding Pelvic: Bartholin urethra Skene was within normal limits vagina: No lesions or discharge Uterus anteverted normal size shape and consistency Adnexa: No masses or tenderness Rectal exam unremarkable  Ultrasound today: Uterus measures 6.6 x 4.6 x 3.2 cm with endometrial stripe of 3.2 mm. Right ovary and left ovary were both atrophic no apparent masses right left ovary normal echogenic pattern no fluid in the cul-de-sac.   Assessment/plan: Patient with breast cancer recent mastectomy completed chemotherapy positive BRCA1 high risk for ovarian cancer. Patient was counseled for laparoscopic bilateral salpingo-oophorectomy at the same time the general surgeons are planning on removing bilateral tissue breast expanders. We're going to run the ovarian cancer tumor blood test today called ROMA1 and I will see the patient in one week before her surgery for complete exam and preoperative consultation.  Literature and information on laparoscopy was provided. Patient's Pap smear less than one year was normal.

## 2013-11-07 NOTE — Patient Instructions (Signed)
Diagnostic Laparoscopy Laparoscopy is a surgical procedure. It is used to diagnose and treat diseases inside the belly (abdomen). It is usually a brief, common, and relatively simple procedure. The laparoscopeis a thin, lighted, pencil-sized instrument. It is like a telescope. It is inserted into your abdomen through a small cut (incision). Your caregiver can look at the organs inside your body through this instrument. He or she can see if there is anything abnormal. Laparoscopy can be done either in a hospital or outpatient clinic. You may be given a mild sedative to help you relax before the procedure. Once in the operating room, you will be given a drug to make you sleep (general anesthesia). Laparoscopy usually lasts less than 1 hour. After the procedure, you will be monitored in a recovery area until you are stable and doing well. Once you are home, it will take 2 to 3 days to fully recover. RISKS AND COMPLICATIONS  Laparoscopy has relatively few risks. Your caregiver will discuss the risks with you before the procedure. Some problems that can occur include:  Infection.  Bleeding. Damage to other organs.BRCA-1 and BRCA-2 BRCA-1 and BRCA-2 are 2 genes that are linked with hereditary breast and ovarian cancers. About 200,000 women are diagnosed with invasive breast cancer each year and about 23,000 with ovarian cancer (according to the [place]). Of these cancers, about 5% to 10% will be due to a mutation in one of the BRCA genes. Men can also inherit an increased risk of developing breast cancer, primarily from an alteration in the BRCA-2 gene.  Individuals with mutations in BRCA1 or BRCA2 have significantly elevated risks for breast cancer (up to 80% lifetime risk), ovarian cancer (up to 40% lifetime risk), bilateral breast cancer and other types of cancers. BRCA mutations are inherited and passed from generation to generation. One half of the time, they are passed from the father's  side of the family.  The DNA in white blood cells is used to detect mutations in the BRCA genes. While the gene products (proteins) of the BRCA genes act only in breast and ovarian tissue, the genes are present in every cell of the body and blood is the most easily accessible source of that DNA. PREPARATION FOR TEST The test for BRCA mutations is done on a blood sample collected by needle from a vein in the arm. The test does not require surgical biopsy of breast or ovarian tissue.  NORMAL FINDINGS No genetic mutations. Ranges for normal findings may vary among different laboratories and hospitals. You should always check with your doctor after having lab work or other tests done to discuss the meaning of your test results and whether your values are considered within normal limits. MEANING OF TEST  Your caregiver will go over the test results with you and discuss the importance and meaning of your results, as well as treatment options and the need for additional tests if necessary. OBTAINING THE TEST RESULTS It is your responsibility to obtain your test results. Ask the lab or department performing the test when and how you will get your results. OTHER THINGS TO KNOW Your test results may have implications for other family members. When one member of a family is tested for BRCA mutations, issues often arise about how or whether to share this information with other family members. Seek advice from a genetic counselor about communication of result with your family members.  Pre and post test consultation with a health care provider knowledgeable about genetic testing  cannot be overemphasized.  There are many issues to be considered when preparing for a genetic test and upon learning the results, and a genetic counselor has the knowledge and experience to help you sort through them.  If the BRCA test is positive, the options include increased frequency of check-ups (e.g., mammography, blood tests for  CA-125, or transvaginal ultrasonography); medications that could reduce risk (e.g., oral contraceptives or tamoxifen); or surgical removal of the ovaries or breasts. There are a number of variables involved and it is important to discuss your options with your doctor and genetic counselor. Research studies have reported that for every 1000 women negative for BRCA mutations, between 12 and 62 of them will develop breast cancer by age 74 and between 29 and 57 will develop ovarian cancer by age 63. The risk increases with age. The test can be ordered by a doctor, preferably by one who can also offer genetic counseling. The blood sample will be sent to a laboratory that specializes in BRCA testing. The American Society of Clinical Oncology and the [place] encourage women seeking the test to participate in long-term outcome studies to help gather information on the effectiveness of different check-up and treatment options. Document Released: 08/04/2004 Document Revised: 10/03/2011 Document Reviewed: 06/16/2008 [facility] Patient Information 2014 [place], [place].    Anesthetic side effects. PROCEDURE Once you receive anesthesia, your surgeon inflates the abdomen with a harmless gas (carbon dioxide). This makes the organs easier to see. The laparoscope is inserted into the abdomen through a small incision. This allows your surgeon to see into the abdomen. Other small instruments are also inserted into the abdomen through other small openings. Many surgeons attach a video camera to the laparoscope to enlarge the view. During a diagnostic laparoscopy, the surgeon may be looking for inflammation, infection, or cancer. Your surgeon may take tissue samples(biopsies). The samples are sent to a specialist in looking at cells and tissue samples (pathologist). The pathologist examines them under a microscope. Biopsies can help to diagnose or confirm a disease. AFTER THE PROCEDURE   The gas is  released from inside the abdomen.  The incisions are closed with stitches (sutures). Because these incisions are small (usually less than 1/2 inch), there is usually minimal discomfort after the procedure. There may be some mild discomfort in the throat. This is from the tube placed in the throat while you were sleeping. You may have some mild abdominal discomfort. There may also be discomfort from the instrument placement incisions in the abdomen.  The recovery time is shortened as long as there are no complications.  You will rest in a recovery room until stable and doing well. As long as there are no complications, you may be allowed to go home. FINDING OUT THE RESULTS OF YOUR TEST Not all test results are available during your visit. If your test results are not back during the visit, make an appointment with your caregiver to find out the results. Do not assume everything is normal if you have not heard from your caregiver or the medical facility. It is important for you to follow up on all of your test results. HOME CARE INSTRUCTIONS   Take all medicines as directed.  Only take over-the-counter or prescription medicines for pain, discomfort, or fever as directed by your caregiver.  Resume daily activities as directed.  Showers are preferred over baths.  You may resume sexual activities in 1 week or as directed.  Do not drive while taking narcotics.  SEEK MEDICAL CARE IF:   There is increasing abdominal pain.  There is new pain in the shoulders (shoulder strap areas).  You feel lightheaded or faint.  You have the chills.  You or your child has an oral temperature above 102 F (38.9 C).  There is pus-like (purulent) drainage from any of the wounds.  You are unable to pass gas or have a bowel movement.  You feel sick to your stomach (nauseous) or throw up (vomit). MAKE SURE YOU:   Understand these instructions.  Will watch your condition.  Will get help right away if  you are not doing well or get worse. Document Released: 10/17/2000 Document Revised: 11/05/2012 Document Reviewed: 07/11/2007 [facility] Patient Information 2014 [place], [place].

## 2013-11-11 ENCOUNTER — Telehealth: Payer: Self-pay

## 2013-11-11 NOTE — Telephone Encounter (Signed)
Left message to call me when ready to schedule surgery

## 2013-11-14 LAB — OVARIAN MALIGNANCY RISK-ROMA
CA125: 27 U/mL (ref <35)
HE4: 32 pM (ref <151)
ROMA PREMENOPAUSAL: 0.28 (ref <1.31)
ROMA Postmenopausal: 1.12 (ref <2.77)

## 2013-11-15 ENCOUNTER — Encounter: Payer: Self-pay | Admitting: Gynecology

## 2013-11-18 ENCOUNTER — Encounter (INDEPENDENT_AMBULATORY_CARE_PROVIDER_SITE_OTHER): Payer: Self-pay

## 2014-01-07 ENCOUNTER — Other Ambulatory Visit: Payer: Self-pay | Admitting: Plastic Surgery

## 2014-01-14 ENCOUNTER — Ambulatory Visit (INDEPENDENT_AMBULATORY_CARE_PROVIDER_SITE_OTHER): Payer: BC Managed Care – PPO | Admitting: Gynecology

## 2014-01-14 ENCOUNTER — Encounter: Payer: Self-pay | Admitting: Gynecology

## 2014-01-14 VITALS — BP 110/70

## 2014-01-14 DIAGNOSIS — Z853 Personal history of malignant neoplasm of breast: Secondary | ICD-10-CM

## 2014-01-14 DIAGNOSIS — Z01818 Encounter for other preprocedural examination: Secondary | ICD-10-CM

## 2014-01-14 MED ORDER — METOCLOPRAMIDE HCL 10 MG PO TABS: 10.0000 mg | ORAL_TABLET | Freq: Three times a day (TID) | ORAL | Status: DC

## 2014-01-14 NOTE — Patient Instructions (Signed)

## 2014-01-14 NOTE — Progress Notes (Signed)
Janet [name] is an 58 y.o. female. For preop examination and consultation.In August of 2014 patient felt a lump in her right breast and her PCP and/or bilateral diagnostic mammograms which had shown a mass in the subareolar region of the right breast. The mass had been palpable and the ultrasound demonstrated 2.6 cm mass with a enlarged right lymph node. The left breast is unremarkable.  Patient subsequently on August 26 underwent a right breast biopsy which showed ([name]) and invasive ductal carcinoma, grade 3, triple negative, with an MIB-1 of 54%. Biopsy of the right axillary lymph node in question at the same time was negative. Patient with BRCA1 positive.  She had bilateral breast MRI 03/26/2013 which demonstrated right subareolar mass measuring 3.8 cm.The previously biopsied benign right axillary lymph node was again noted. There were no other suspicious areas in the right breast or axilla and the left breast was unremarkable. Dr. Jana Hakim has been her medical oncologist who counseled and administered 4 cycle dose of doxorubicin/ cyclophosphamide between 04/17/2013 and 05/29/2013 , at standard doses , with Neulasta support;on 06/12/2013, started 12 weekly doses of carboplatin/ paclitaxel. One treatment was held in January secondary to chemotherapy-induced neutropenia, and that cycle was not "made up". Accordingly, patient received a total of 11 weekly doses, with final dose on 09/04/2013.   On 09/30/2013 Dr. [name] had performed bilateral mastectomies, right axillary lymph node biopsy and removal of Port-A-Cath. Dr. Crissie Reese had placed bilateral tissue expanders.  Patient has been staged clinically T2 N0, stage IIA invasive ductal breast cancer, grade 3, triple negative, with an MIB-1 of 54%.  We had discussed today that due to the fact that she was BRCA1 positive that research studies have reported that for every 1000 women negative for BRCA mutations, between 12 and 72 of them  will develop breast cancer by age 61 and between 76 and 4 will develop ovarian cancer by age 29. The risk increases with age. The patient has never been on replacement therapy in the past  Patient is scheduled to undergo laparoscopic bilateral salpingo-oophorectomy prophylactically. Recent ROMA-1 ovarian cancer screen was negative.  Ultrasound done 11/07/2013 as follows: Uterus measures 6.6 x 4.6 x 3.2 cm with endometrial stripe of 3.2 mm. Right ovary and left ovary were both atrophic no apparent masses right left ovary normal echogenic pattern no fluid in the cul-de-sac.  Pertinent Gynecological History: Menses: post-menopausal Bleeding: none Contraception: post menopausal status DES exposure: unknown Blood transfusions: none Sexually transmitted diseases: no past history Previous GYN Procedures: DNC 2 NSVD Last mammogram: See above Date: see above Last pap: normal Date: 2014 OB History: G3, P2A1   Menstrual History: Menarche age: 34  No LMP recorded. Patient is postmenopausal.    Past Medical History  Diagnosis Date  . Migraines   . Cancer     breast  . Breast cancer   . Allergy   . BRCA1 positive 2015  . Anemia, unspecified 07/09/2013    chemo    Past Surgical History  Procedure Laterality Date  . Removal of cataracts    . Foot surgery  age 54    bone graft  . Portacath placement N/A 04/04/2013    Procedure: INSERTION PORT-A-CATH;  Surgeon: Haywood Lasso, MD;  Location: [facility];  Service: General;  Laterality: N/A;  . Mastectomy with axillary lymph node dissection Bilateral 09/30/2013    DR BOWERS  . Mastectomy with axillary lymph node dissection Bilateral 09/30/2013    Procedure: BILATERAL MASTECTOMIES RIGHT  AXILLARY LYMPH NODE BIOPSY;  Surgeon: Shann Medal, MD;  Location: [place];  Service: General;  Laterality: Bilateral;  . Port-a-cath removal Left 09/30/2013    Procedure: REMOVAL PORT-A-CATH;  Surgeon: Shann Medal, MD;  Location: [facility];  Service: General;   Laterality: Left;  . Breast reconstruction with placement of tissue expander and flex hd (acellular hydrated dermis) Bilateral 09/30/2013    Procedure: BILATERAL TISSUE EXPANDERS ;  Surgeon: Crissie Reese, MD;  Location: [facility];  Service: Plastics;  Laterality: Bilateral;    Family History  Problem Relation Age of Onset  . Breast cancer Maternal Grandmother 68  . Pancreatic cancer Father 24  . Heart Problems Maternal Uncle   . Ovarian cancer Paternal Aunt     dx in her 34s-60s  . Stomach cancer Other     father's maternal grandmother  . Breast cancer Cousin     Father's maternal cousin's daughter; BRCA1+    Social History:  reports that she has never smoked. She has never used smokeless tobacco. She reports that she drinks alcohol. She reports that she does not use illicit drugs.  Allergies:  Allergies  Allergen Reactions  . Azithromycin Hives  . Penicillins Rash     (Not in a hospital admission)  REVIEW OF SYSTEMS: A ROS was performed and pertinent positives and negatives are included in the history.  GENERAL: No fevers or chills. HEENT: No change in vision, no earache, sore throat or sinus congestion. NECK: No pain or stiffness. CARDIOVASCULAR: No chest pain or pressure. No palpitations. PULMONARY: No shortness of breath, cough or wheeze. GASTROINTESTINAL: No abdominal pain, nausea, vomiting or diarrhea, melena or bright red blood per rectum. GENITOURINARY: No urinary frequency, urgency, hesitancy or dysuria. MUSCULOSKELETAL: No joint or muscle pain, no back pain, no recent trauma. DERMATOLOGIC: No rash, no itching, no lesions. ENDOCRINE: No polyuria, polydipsia, no heat or cold intolerance. No recent change in weight. HEMATOLOGICAL: No anemia or easy bruising or bleeding. NEUROLOGIC: No headache, seizures, numbness, tingling or weakness. PSYCHIATRIC: No depression, no loss of interest in normal activity or change in sleep pattern.     Blood pressure 110/70.  Physical  Exam:  HEENT:unremarkable Neck:Supple, midline, no thyroid megaly, no carotid bruits Lungs:  Clear to auscultation no rhonchi's or wheezes Heart:Regular rate and rhythm, no murmurs or gallops Breast Exam: Patient with evidence of breast reconstruction no palpable masses no supraclavicular axillary lymphadenopathy Abdomen: Soft nontender no rebound or guarding Pelvic:BUS within normal limits Vagina: No lesions or discharge Cervix: No lesions or discharge Uterus: Slightly anteverted normal size shape and consistency Adnexa: No palpable masses or tenderness Extremities: No cords, no edema Rectal: Unremarkable   Assessment/plan: Patient with breast cancer recent mastectomy completed chemotherapy positive BRCA1 high risk for ovarian cancer. Patient was counseled for laparoscopic bilateral salpingo-oophorectomy. The general surgeon Dr. Harlow Mares will be removing bilateral tissue breast expanders under the same anesthesia. Patient stated that Dr. Harlow Mares is 1 to start her on Lovenox prophylactically as well. The following risks were also discussed:                        Patient was counseled as to the risk of surgery to include the following:  1. Infection (prohylactic antibiotics will be administered)  2. DVT/Pulmonary Embolism (prophylactic pneumo compression stockings will be used)  3.Trauma to internal organs requiring additional surgical procedure to repair any injury to     Internal organs requiring perhaps additional hospitalization days.  4.Hemmorhage  requiring transfusion and blood products which carry risks such as   anaphylactic reaction, hepatitis and AIDS  Patient had received literature information on the procedure scheduled and all her questions were answered and fully accepts all risk.   [facility] HMD3:38 PMTD'@Note' : This dictation was prepared with  Dragon/digital dictation along withSmart phrase technology. Any transcriptional errors that result from this process are  unintentional.       Terrance Mass 01/14/2014, 2:27 PM  Note: This dictation was prepared with  Dragon/digital dictation along withSmart phrase technology. Any transcriptional errors that result from this process are unintentional.

## 2014-01-23 ENCOUNTER — Encounter (HOSPITAL_COMMUNITY): Payer: Self-pay | Admitting: Pharmacist

## 2014-01-23 NOTE — Patient Instructions (Signed)
Your procedure is scheduled on: Wednesday, January 29, 2014  Enter through the [employer] of [facility] at: 7:00 am  Pick up the phone at the desk and dial [phone].  Call this number if you have problems the morning of surgery: [phone].  Remember: Do NOT eat food: After Midnight Tuesday Do NOT drink clear liquids after: After Midnight Tuesday Take these medicines the morning of surgery with a SIP OF WATER: None  Do NOT wear jewelry (body piercing), metal hair clips/bobby pins, make-up, or nail polish. Do NOT wear lotions, powders, or perfumes.  You may wear deoderant. Do NOT shave for 48 hours prior to surgery. Do NOT bring valuables to the hospital. Contacts, dentures, or bridgework may not be worn into surgery.  Have a responsible adult drive you home and stay with you for 24 hours after your procedure

## 2014-01-27 ENCOUNTER — Encounter (HOSPITAL_COMMUNITY): Payer: Self-pay

## 2014-01-27 ENCOUNTER — Encounter (HOSPITAL_COMMUNITY)
Admission: RE | Admit: 2014-01-27 | Discharge: 2014-01-27 | Disposition: A | Discharge status: Home / Self Care | Payer: BC Managed Care – PPO | Source: Ambulatory Visit | Attending: Gynecology | Admitting: Gynecology

## 2014-01-27 LAB — URINALYSIS, ROUTINE W REFLEX MICROSCOPIC
Bilirubin Urine: NEGATIVE
Glucose, UA: NEGATIVE mg/dL
Ketones, ur: NEGATIVE mg/dL
Leukocytes, UA: NEGATIVE
Nitrite: NEGATIVE
Protein, ur: NEGATIVE mg/dL
SPECIFIC GRAVITY, URINE: 1.01 (ref 1.005–1.030)
UROBILINOGEN UA: 0.2 mg/dL (ref 0.0–1.0)
pH: 6.5 (ref 5.0–8.0)

## 2014-01-27 LAB — CBC
HCT: 42 % (ref 36.0–46.0)
HEMOGLOBIN: 14.8 g/dL (ref 12.0–15.0)
MCH: 33.1 pg (ref 26.0–34.0)
MCHC: 35.2 g/dL (ref 30.0–36.0)
MCV: 94 fL (ref 78.0–100.0)
Platelets: 171 10*3/uL (ref 150–400)
RBC: 4.47 MIL/uL (ref 3.87–5.11)
RDW: 12 % (ref 11.5–15.5)
WBC: 5.3 10*3/uL (ref 4.0–10.5)

## 2014-01-27 LAB — BASIC METABOLIC PANEL
ANION GAP: 9 (ref 5–15)
BUN: 14 mg/dL (ref 6–23)
CHLORIDE: 101 meq/L (ref 96–112)
CO2: 31 meq/L (ref 19–32)
Calcium: 11.5 mg/dL — ABNORMAL HIGH (ref 8.4–10.5)
Creatinine, Ser: 0.81 mg/dL (ref 0.50–1.10)
GFR calc non Af Amer: 79 mL/min — ABNORMAL LOW (ref >90)
Glucose, Bld: 94 mg/dL (ref 70–99)
Potassium: 4.7 mEq/L (ref 3.7–5.3)
SODIUM: 141 meq/L (ref 137–147)

## 2014-01-27 LAB — URINE MICROSCOPIC-ADD ON

## 2014-01-28 MED ORDER — GENTAMICIN SULFATE 40 MG/ML IJ SOLN
INTRAVENOUS | Status: AC
  Administered 2014-01-29: 225 mL via INTRAVENOUS
  Filled 2014-01-28: qty 6.75

## 2014-01-28 MED ORDER — VANCOMYCIN HCL IN DEXTROSE 1-5 GM/200ML-% IV SOLN
1000.0000 mg | INTRAVENOUS | Status: AC
  Administered 2014-01-29: 1000 mg via INTRAVENOUS
  Filled 2014-01-28: qty 200

## 2014-01-29 ENCOUNTER — Ambulatory Visit (HOSPITAL_COMMUNITY)
Admission: RE | Admit: 2014-01-29 | Discharge: 2014-01-29 | Disposition: A | Discharge status: Home / Self Care | Payer: BC Managed Care – PPO | Source: Ambulatory Visit | Attending: Gynecology | Admitting: Gynecology

## 2014-01-29 ENCOUNTER — Encounter (HOSPITAL_COMMUNITY): Payer: BC Managed Care – PPO | Admitting: Anesthesiology

## 2014-01-29 ENCOUNTER — Ambulatory Visit (HOSPITAL_COMMUNITY): Payer: BC Managed Care – PPO | Admitting: Anesthesiology

## 2014-01-29 ENCOUNTER — Encounter (HOSPITAL_COMMUNITY)
Admission: RE | Disposition: A | Discharge status: Home / Self Care | Payer: Self-pay | Source: Ambulatory Visit | Attending: Gynecology

## 2014-01-29 ENCOUNTER — Encounter (HOSPITAL_COMMUNITY): Payer: Self-pay | Admitting: Anesthesiology

## 2014-01-29 DIAGNOSIS — Z853 Personal history of malignant neoplasm of breast: Secondary | ICD-10-CM

## 2014-01-29 DIAGNOSIS — Z1501 Genetic susceptibility to malignant neoplasm of breast: Secondary | ICD-10-CM | POA: Insufficient documentation

## 2014-01-29 DIAGNOSIS — Z4002 Encounter for prophylactic removal of ovary: Secondary | ICD-10-CM | POA: Insufficient documentation

## 2014-01-29 DIAGNOSIS — Z8041 Family history of malignant neoplasm of ovary: Secondary | ICD-10-CM | POA: Insufficient documentation

## 2014-01-29 DIAGNOSIS — Z803 Family history of malignant neoplasm of breast: Secondary | ICD-10-CM | POA: Insufficient documentation

## 2014-01-29 DIAGNOSIS — Z880 Allergy status to penicillin: Secondary | ICD-10-CM | POA: Insufficient documentation

## 2014-01-29 DIAGNOSIS — Z800 Family history of malignant neoplasm of digestive organs: Secondary | ICD-10-CM | POA: Insufficient documentation

## 2014-01-29 DIAGNOSIS — C50919 Malignant neoplasm of unspecified site of unspecified female breast: Secondary | ICD-10-CM | POA: Insufficient documentation

## 2014-01-29 DIAGNOSIS — Z9889 Other specified postprocedural states: Secondary | ICD-10-CM

## 2014-01-29 DIAGNOSIS — Z170 Estrogen receptor positive status [ER+]: Secondary | ICD-10-CM | POA: Insufficient documentation

## 2014-01-29 HISTORY — PX: LAPAROSCOPIC BILATERAL SALPINGO OOPHERECTOMY: SHX5890

## 2014-01-29 HISTORY — PX: REMOVAL OF TISSUE EXPANDER AND PLACEMENT OF IMPLANT: SHX6457

## 2014-01-29 SURGERY — SALPINGO-OOPHORECTOMY, BILATERAL, LAPAROSCOPIC
Anesthesia: General | Site: Abdomen

## 2014-01-29 MED ORDER — HEPARIN SODIUM (PORCINE) 5000 UNIT/ML IJ SOLN
INTRAMUSCULAR | Status: AC
  Filled 2014-01-29: qty 1

## 2014-01-29 MED ORDER — MIDAZOLAM HCL 2 MG/2ML IJ SOLN
INTRAMUSCULAR | Status: AC
  Filled 2014-01-29: qty 2

## 2014-01-29 MED ORDER — FENTANYL CITRATE 0.05 MG/ML IJ SOLN
INTRAMUSCULAR | Status: AC
  Filled 2014-01-29: qty 5

## 2014-01-29 MED ORDER — GLYCOPYRROLATE 0.2 MG/ML IJ SOLN
INTRAMUSCULAR | Status: DC | PRN
  Administered 2014-01-29: 0.6 mg via INTRAVENOUS

## 2014-01-29 MED ORDER — GLYCOPYRROLATE 0.2 MG/ML IJ SOLN
INTRAMUSCULAR | Status: AC
  Filled 2014-01-29: qty 3

## 2014-01-29 MED ORDER — HEPARIN SODIUM (PORCINE) 5000 UNIT/ML IJ SOLN
5000.0000 [IU] | Freq: Once | INTRAMUSCULAR | Status: AC
  Administered 2014-01-29: 5000 [IU] via SUBCUTANEOUS

## 2014-01-29 MED ORDER — SODIUM CHLORIDE 0.9 % IR SOLN
Status: DC | PRN
  Administered 2014-01-29: 3000 mL

## 2014-01-29 MED ORDER — SODIUM CHLORIDE 0.9 % IR SOLN
Freq: Once | Status: DC
  Filled 2014-01-29: qty 1

## 2014-01-29 MED ORDER — HEPARIN SODIUM (PORCINE) 5000 UNIT/ML IJ SOLN
INTRAMUSCULAR | Status: DC | PRN
  Administered 2014-01-29: 5000 [IU]

## 2014-01-29 MED ORDER — HYDROMORPHONE HCL PF 1 MG/ML IJ SOLN: 0.2500 mg | INTRAMUSCULAR | Status: DC | PRN

## 2014-01-29 MED ORDER — ACETAMINOPHEN 160 MG/5ML PO SOLN
975.0000 mg | Freq: Once | ORAL | Status: AC
  Administered 2014-01-29: 975 mg via ORAL

## 2014-01-29 MED ORDER — ONDANSETRON HCL 4 MG/2ML IJ SOLN: 4.0000 mg | Freq: Once | INTRAMUSCULAR | Status: DC | PRN

## 2014-01-29 MED ORDER — LACTATED RINGERS IV SOLN
INTRAVENOUS | Status: DC
  Administered 2014-01-29 (×3): via INTRAVENOUS

## 2014-01-29 MED ORDER — MIDAZOLAM HCL 2 MG/2ML IJ SOLN
INTRAMUSCULAR | Status: DC | PRN
  Administered 2014-01-29: 2 mg via INTRAVENOUS

## 2014-01-29 MED ORDER — EPHEDRINE 5 MG/ML INJ
INTRAVENOUS | Status: DC | PRN
  Administered 2014-01-29: 10 mg via INTRAVENOUS
  Administered 2014-01-29: 5 mg via INTRAVENOUS
  Administered 2014-01-29: 10 mg via INTRAVENOUS
  Administered 2014-01-29: 5 mg via INTRAVENOUS

## 2014-01-29 MED ORDER — BUPIVACAINE HCL (PF) 0.25 % IJ SOLN
INTRAMUSCULAR | Status: AC
  Filled 2014-01-29: qty 30

## 2014-01-29 MED ORDER — DEXAMETHASONE SODIUM PHOSPHATE 10 MG/ML IJ SOLN
INTRAMUSCULAR | Status: AC
  Filled 2014-01-29: qty 1

## 2014-01-29 MED ORDER — FENTANYL CITRATE 0.05 MG/ML IJ SOLN
INTRAMUSCULAR | Status: DC | PRN
  Administered 2014-01-29: 25 ug via INTRAVENOUS
  Administered 2014-01-29 (×2): 50 ug via INTRAVENOUS
  Administered 2014-01-29 (×3): 25 ug via INTRAVENOUS

## 2014-01-29 MED ORDER — HEPARIN SODIUM (PORCINE) 5000 UNIT/ML IJ SOLN
INTRAMUSCULAR | Status: AC
  Administered 2014-01-29: 5000 [IU] via SUBCUTANEOUS
  Filled 2014-01-29: qty 1

## 2014-01-29 MED ORDER — FENTANYL CITRATE 0.05 MG/ML IJ SOLN
25.0000 ug | INTRAMUSCULAR | Status: DC | PRN
  Administered 2014-01-29: 50 ug via INTRAVENOUS

## 2014-01-29 MED ORDER — LIDOCAINE HCL (CARDIAC) 20 MG/ML IV SOLN
INTRAVENOUS | Status: AC
  Filled 2014-01-29: qty 5

## 2014-01-29 MED ORDER — CHLORHEXIDINE GLUCONATE 4 % EX LIQD
1.0000 "application " | Freq: Once | CUTANEOUS | Status: DC
  Filled 2014-01-29: qty 15

## 2014-01-29 MED ORDER — NEOSTIGMINE METHYLSULFATE 10 MG/10ML IV SOLN
INTRAVENOUS | Status: DC | PRN
  Administered 2014-01-29: 4 mg via INTRAVENOUS

## 2014-01-29 MED ORDER — ROCURONIUM BROMIDE 100 MG/10ML IV SOLN
INTRAVENOUS | Status: DC | PRN
Start: 2014-01-29 — End: 2014-01-29
  Administered 2014-01-29: 10 mg via INTRAVENOUS
  Administered 2014-01-29: 35 mg via INTRAVENOUS
  Administered 2014-01-29: 30 mg via INTRAVENOUS
  Administered 2014-01-29 (×2): 10 mg via INTRAVENOUS

## 2014-01-29 MED ORDER — LIDOCAINE HCL (CARDIAC) 20 MG/ML IV SOLN
INTRAVENOUS | Status: DC | PRN
  Administered 2014-01-29: 40 mg via INTRAVENOUS
  Administered 2014-01-29: 30 mg via INTRAVENOUS

## 2014-01-29 MED ORDER — BACITRACIN-NEOMYCIN-POLYMYXIN 400-5-5000 EX OINT
TOPICAL_OINTMENT | CUTANEOUS | Status: AC
  Filled 2014-01-29: qty 1

## 2014-01-29 MED ORDER — CEPHALEXIN 500 MG PO CAPS
500.0000 mg | ORAL_CAPSULE | Freq: Four times a day (QID) | ORAL | Status: DC
Start: 2014-01-29 — End: 2014-01-29

## 2014-01-29 MED ORDER — DEXAMETHASONE SODIUM PHOSPHATE 10 MG/ML IJ SOLN
INTRAMUSCULAR | Status: DC | PRN
  Administered 2014-01-29: 10 mg via INTRAVENOUS

## 2014-01-29 MED ORDER — LACTATED RINGERS IR SOLN
Status: DC | PRN
  Administered 2014-01-29: 3000 mL

## 2014-01-29 MED ORDER — FENTANYL CITRATE 0.05 MG/ML IJ SOLN
INTRAMUSCULAR | Status: AC
  Filled 2014-01-29: qty 2

## 2014-01-29 MED ORDER — SULFAMETHOXAZOLE-TMP DS 800-160 MG PO TABS
1.0000 | ORAL_TABLET | Freq: Two times a day (BID) | ORAL | Status: DC
Start: 2014-01-29 — End: 2014-04-22

## 2014-01-29 MED ORDER — EPHEDRINE 5 MG/ML INJ
INTRAVENOUS | Status: AC
  Filled 2014-01-29: qty 10

## 2014-01-29 MED ORDER — ONDANSETRON HCL 4 MG/2ML IJ SOLN
INTRAMUSCULAR | Status: DC | PRN
  Administered 2014-01-29: 4 mg via INTRAVENOUS

## 2014-01-29 MED ORDER — PROMETHAZINE HCL 25 MG/ML IJ SOLN: 6.2500 mg | INTRAMUSCULAR | Status: DC | PRN

## 2014-01-29 MED ORDER — BACITRACIN-NEOMYCIN-POLYMYXIN OINTMENT TUBE
TOPICAL_OINTMENT | CUTANEOUS | Status: DC | PRN
  Administered 2014-01-29 (×2): 1 via TOPICAL

## 2014-01-29 MED ORDER — ROCURONIUM BROMIDE 100 MG/10ML IV SOLN
INTRAVENOUS | Status: AC
  Filled 2014-01-29: qty 1

## 2014-01-29 MED ORDER — SODIUM CHLORIDE 0.9 % IR SOLN
Status: DC | PRN
  Administered 2014-01-29: 01:00:00

## 2014-01-29 MED ORDER — ACETAMINOPHEN 160 MG/5ML PO SOLN
ORAL | Status: AC
  Administered 2014-01-29: 975 mg via ORAL
  Filled 2014-01-29: qty 40.6

## 2014-01-29 MED ORDER — BUPIVACAINE HCL (PF) 0.25 % IJ SOLN
INTRAMUSCULAR | Status: DC | PRN
  Administered 2014-01-29: 10 mL

## 2014-01-29 MED ORDER — PROPOFOL 10 MG/ML IV BOLUS
INTRAVENOUS | Status: DC | PRN
  Administered 2014-01-29: 140 mg via INTRAVENOUS

## 2014-01-29 MED ORDER — METHYLENE BLUE 1 % INJ SOLN
INTRAMUSCULAR | Status: AC
  Filled 2014-01-29: qty 1

## 2014-01-29 MED ORDER — ONDANSETRON HCL 4 MG/2ML IJ SOLN
INTRAMUSCULAR | Status: AC
  Filled 2014-01-29: qty 2

## 2014-01-29 MED ORDER — NEOSTIGMINE METHYLSULFATE 10 MG/10ML IV SOLN
INTRAVENOUS | Status: AC
  Filled 2014-01-29: qty 1

## 2014-01-29 MED ORDER — MEPERIDINE HCL 25 MG/ML IJ SOLN: 6.2500 mg | INTRAMUSCULAR | Status: DC | PRN

## 2014-01-29 MED ORDER — PHENYLEPHRINE 40 MCG/ML (10ML) SYRINGE FOR IV PUSH (FOR BLOOD PRESSURE SUPPORT)
PREFILLED_SYRINGE | INTRAVENOUS | Status: AC
  Filled 2014-01-29: qty 5

## 2014-01-29 MED ORDER — PHENYLEPHRINE HCL 10 MG/ML IJ SOLN
INTRAMUSCULAR | Status: DC | PRN
Start: 2014-01-29 — End: 2014-01-29
  Administered 2014-01-29: 80 ug via INTRAVENOUS
  Administered 2014-01-29: 40 ug via INTRAVENOUS
  Administered 2014-01-29: 80 ug via INTRAVENOUS

## 2014-01-29 MED ORDER — PROPOFOL 10 MG/ML IV EMUL
INTRAVENOUS | Status: AC
  Filled 2014-01-29: qty 20

## 2014-01-29 MED ORDER — KETOROLAC TROMETHAMINE 30 MG/ML IJ SOLN: 15.0000 mg | Freq: Once | INTRAMUSCULAR | Status: DC | PRN

## 2014-01-29 SURGICAL SUPPLY — 56 items
ASEPTIC FLUID TRANSFER SETS ×4 IMPLANT
BANDAGE ELASTIC 6 VELCRO ST LF (GAUZE/BANDAGES/DRESSINGS) ×4 IMPLANT
BARRIER ADHS 3X4 INTERCEED (GAUZE/BANDAGES/DRESSINGS) IMPLANT
BINDER BREAST MEDIUM (GAUZE/BANDAGES/DRESSINGS) ×4 IMPLANT
BLADE 10 SAFETY STRL DISP (BLADE) ×4 IMPLANT
BLADE EXTENDED COATED 6.5IN (ELECTRODE) ×4 IMPLANT
CABLE HIGH FREQUENCY MONO STRZ (ELECTRODE) IMPLANT
CLOSURE WOUND 1/2 X4 (GAUZE/BANDAGES/DRESSINGS) ×2
CLOTH BEACON ORANGE TIMEOUT ST (SAFETY) ×4 IMPLANT
CONT PATH 16OZ SNAP LID 3702 (MISCELLANEOUS) ×4 IMPLANT
COVER MAYO STAND STRL (DRAPES) ×4 IMPLANT
DERMABOND ADVANCED (GAUZE/BANDAGES/DRESSINGS) ×4
DERMABOND ADVANCED .7 DNX12 (GAUZE/BANDAGES/DRESSINGS) ×4 IMPLANT
DRAIN CHANNEL 19F RND (DRAIN) ×8 IMPLANT
DRAPE LAPAROSCOPIC ABDOMINAL (DRAPES) IMPLANT
DRESSING XEROFORM 5X9 (GAUZE/BANDAGES/DRESSINGS) ×4 IMPLANT
DRSG COVADERM PLUS 2X2 (GAUZE/BANDAGES/DRESSINGS) ×8 IMPLANT
DRSG OPSITE POSTOP 3X4 (GAUZE/BANDAGES/DRESSINGS) IMPLANT
ELECT CAUTERY BLADE 6.4 (BLADE) ×4 IMPLANT
EVACUATOR SILICONE 100CC (DRAIN) ×4 IMPLANT
FILTER SMOKE EVAC LAPAROSHD (FILTER) ×4 IMPLANT
GAUZE SPONGE 4X4 12PLY STRL (GAUZE/BANDAGES/DRESSINGS) ×8 IMPLANT
GLOVE BIO SURGEON STRL SZ7.5 (GLOVE) ×4 IMPLANT
GLOVE BIOGEL PI IND STRL 8 (GLOVE) ×4 IMPLANT
GLOVE BIOGEL PI INDICATOR 8 (GLOVE) ×4
GLOVE ECLIPSE 7.5 STRL STRAW (GLOVE) ×8 IMPLANT
GOWN STRL REUS W/TWL LRG LVL3 (GOWN DISPOSABLE) ×20 IMPLANT
IMPL BREAST SALINE HP 230CC (Breast) ×4 IMPLANT
IMPLANT BREAST SALINE HP 230CC (Breast) ×8 IMPLANT
NS IRRIG 1000ML POUR BTL (IV SOLUTION) ×8 IMPLANT
PACK ABDOMINAL GYN (CUSTOM PROCEDURE TRAY) ×4 IMPLANT
PACK LAPAROSCOPY BASIN (CUSTOM PROCEDURE TRAY) ×4 IMPLANT
PAD ABD 7.5X8 STRL (GAUZE/BANDAGES/DRESSINGS) ×8 IMPLANT
PAD ABD 8X7 1/2 STERILE (GAUZE/BANDAGES/DRESSINGS) ×8 IMPLANT
POUCH SPECIMEN RETRIEVAL 10MM (ENDOMECHANICALS) IMPLANT
PROTECTOR NERVE ULNAR (MISCELLANEOUS) ×4 IMPLANT
SET IRRIG TUBING LAPAROSCOPIC (IRRIGATION / IRRIGATOR) ×4 IMPLANT
SHEARS HARMONIC ACE PLUS 36CM (ENDOMECHANICALS) IMPLANT
SOLUTION ELECTROLUBE (MISCELLANEOUS) IMPLANT
SPONGE LAP 18X18 X RAY DECT (DISPOSABLE) ×8 IMPLANT
STRIP CLOSURE SKIN 1/2X4 (GAUZE/BANDAGES/DRESSINGS) ×6 IMPLANT
SUT MNCRL AB 3-0 PS2 27 (SUTURE) ×8 IMPLANT
SUT MNCRL AB 4-0 PS2 18 (SUTURE) ×8 IMPLANT
SUT PROLENE 3 0 PS 1 (SUTURE) ×8 IMPLANT
SUT VIC AB 3-0 PS2 18 (SUTURE) ×2
SUT VIC AB 3-0 PS2 18XBRD (SUTURE) ×2 IMPLANT
SUT VIC AB 3-0 SH 18 (SUTURE) ×4 IMPLANT
SUT VICRYL 0 UR6 27IN ABS (SUTURE) ×4 IMPLANT
TAPE CLOTH SURG 4X10 WHT LF (GAUZE/BANDAGES/DRESSINGS) ×4 IMPLANT
TOWEL OR 17X24 6PK STRL BLUE (TOWEL DISPOSABLE) ×8 IMPLANT
TRAY FOLEY CATH 14FR (SET/KITS/TRAYS/PACK) ×8 IMPLANT
TROCAR XCEL NON-BLD 11X100MML (ENDOMECHANICALS) ×4 IMPLANT
TROCAR XCEL NON-BLD 5MMX100MML (ENDOMECHANICALS) ×4 IMPLANT
TROCAR XCEL OPT SLVE 5M 100M (ENDOMECHANICALS) ×4 IMPLANT
WARMER LAPAROSCOPE (MISCELLANEOUS) ×4 IMPLANT
WATER STERILE IRR 1000ML POUR (IV SOLUTION) ×4 IMPLANT

## 2014-01-29 NOTE — Transfer of Care (Signed)
Immediate Anesthesia Transfer of Care Note  Patient: Janet [name]  Procedure(s) Performed: Procedure(s) with comments: LAPAROSCOPIC BILATERAL SALPINGO OOPHORECTOMY (N/A) - Dr. Phineas Real to assist.  Dr. Crissie Reese will be following with some reconstructive breast surgery.  His case will take approx 1 1/2 hours and his scheduler will post her part once we have a date. Thanks! REMOVAL OF BILATERAL TISSUE EXPANDER AND PLACEMENT OF IMPLANT FOR BREAST RECONSTRUCTION (Bilateral)  Patient Location: PACU  Anesthesia Type:General  Level of Consciousness: awake, alert , oriented and patient cooperative  Airway & Oxygen Therapy: Patient Spontanous Breathing and Patient connected to nasal cannula oxygen  Post-op Assessment: Report given to PACU RN and Post -op Vital signs reviewed and stable  Post vital signs: Reviewed and stable  Complications: No apparent anesthesia complications

## 2014-01-29 NOTE — Interval H&P Note (Signed)
History and Physical Interval Note:  01/29/2014 7:50 AM  Janet [name]  has presented today for surgery, with the diagnosis of personal history of breast cancer, BRACA 1 positivie  The various methods of treatment have been discussed with the patient and family. After consideration of risks, benefits and other options for treatment, the patient has consented to  Procedure(s) with comments: [place] (N/A) - Dr. Phineas Real to assist.  Dr. Crissie Reese will be following with some reconstructive breast surgery.  His case will take approx 1 1/2 hours and his scheduler will post her part once we have a date. Thanks! REMOVAL OF BILATERAL TISSUE EXPANDER AND PLACEMENT OF IMPLANT FOR BREAST RECONSTRUCTION (Bilateral) as a surgical intervention .  The patient's history has been reviewed, patient examined, no change in status, stable for surgery.  I have reviewed the patient's chart and labs.  Questions were answered to the patient's satisfaction.     Terrance Mass

## 2014-01-29 NOTE — Anesthesia Preprocedure Evaluation (Signed)
Anesthesia Evaluation  Patient identified by MRN, date of birth, ID band Patient awake    Reviewed: Allergy & Precautions, H&P , NPO status , Patient's Chart, lab work & pertinent test results  Airway Mallampati: I TM Distance: >3 FB Neck ROM: full    Dental no notable dental hx. (+) Teeth Intact   Pulmonary neg pulmonary ROS,    Pulmonary exam normal       Cardiovascular negative cardio ROS      Neuro/Psych negative psych ROS   GI/Hepatic negative GI ROS, Neg liver ROS,   Endo/Other  negative endocrine ROS  Renal/GU negative Renal ROS     Musculoskeletal   Abdominal Normal abdominal exam  (+)   Peds  Hematology   Anesthesia Other Findings   Reproductive/Obstetrics negative OB ROS                           Anesthesia Physical Anesthesia Plan  ASA: II  Anesthesia Plan: General   Post-op Pain Management:    Induction: Intravenous  Airway Management Planned: Oral ETT  Additional Equipment:   Intra-op Plan:   Post-operative Plan: Extubation in OR  Informed Consent: I have reviewed the patients History and Physical, chart, labs and discussed the procedure including the risks, benefits and alternatives for the proposed anesthesia with the patient or authorized representative who has indicated his/her understanding and acceptance.   Dental Advisory Given  Plan Discussed with: CRNA and Surgeon  Anesthesia Plan Comments:         Anesthesia Quick Evaluation

## 2014-01-29 NOTE — Anesthesia Postprocedure Evaluation (Signed)
  Anesthesia Post-op Note  Patient: [name]  Procedure(s) Performed: Procedure(s) with comments: LAPAROSCOPIC BILATERAL SALPINGO OOPHORECTOMY (N/A) - Dr. Phineas Real to assist.  Dr. Crissie Reese will be following with some reconstructive breast surgery.  His case will take approx 1 1/2 hours and his scheduler will post her part once we have a date. Thanks! REMOVAL OF BILATERAL TISSUE EXPANDER AND PLACEMENT OF IMPLANT FOR BREAST RECONSTRUCTION (Bilateral)  Patient Location: PACU  Anesthesia Type:General  Level of Consciousness: awake, alert  and oriented  Airway and Oxygen Therapy: Patient Spontanous Breathing  Post-op Pain: mild  Post-op Assessment: Post-op Vital signs reviewed, Patient's Cardiovascular Status Stable, Respiratory Function Stable, Patent Airway, No signs of Nausea or vomiting and Pain level controlled  Post-op Vital Signs: Reviewed and stable  Last Vitals:  Filed Vitals:   01/29/14 1215  BP: 111/62  Pulse: 60  Temp:   Resp: 22    Complications: No apparent anesthesia complications

## 2014-01-29 NOTE — H&P (View-Only) (Signed)
Janet [name] is an 58 y.o. female. For preop examination and consultation.In August of 2014 patient felt a lump in her right breast and her PCP and/or bilateral diagnostic mammograms which had shown a mass in the subareolar region of the right breast. The mass had been palpable and the ultrasound demonstrated 2.6 cm mass with a enlarged right lymph node. The left breast is unremarkable.  Patient subsequently on August 26 underwent a right breast biopsy which showed ([name]) and invasive ductal carcinoma, grade 3, triple negative, with an MIB-1 of 54%. Biopsy of the right axillary lymph node in question at the same time was negative. Patient with BRCA1 positive.  She had bilateral breast MRI 03/26/2013 which demonstrated right subareolar mass measuring 3.8 cm.The previously biopsied benign right axillary lymph node was again noted. There were no other suspicious areas in the right breast or axilla and the left breast was unremarkable. Dr. Jana Hakim has been her medical oncologist who counseled and administered 4 cycle dose of doxorubicin/ cyclophosphamide between 04/17/2013 and 05/29/2013 , at standard doses , with Neulasta support;on 06/12/2013, started 12 weekly doses of carboplatin/ paclitaxel. One treatment was held in January secondary to chemotherapy-induced neutropenia, and that cycle was not "made up". Accordingly, patient received a total of 11 weekly doses, with final dose on 09/04/2013.   On 09/30/2013 Dr. [name] had performed bilateral mastectomies, right axillary lymph node biopsy and removal of Port-A-Cath. Dr. Crissie Reese had placed bilateral tissue expanders.  Patient has been staged clinically T2 N0, stage IIA invasive ductal breast cancer, grade 3, triple negative, with an MIB-1 of 54%.  We had discussed today that due to the fact that she was BRCA1 positive that research studies have reported that for every 1000 women negative for BRCA mutations, between 12 and 28 of them  will develop breast cancer by age 73 and between 30 and 4 will develop ovarian cancer by age 11. The risk increases with age. The patient has never been on replacement therapy in the past  Patient is scheduled to undergo laparoscopic bilateral salpingo-oophorectomy prophylactically. Recent ROMA-1 ovarian cancer screen was negative.  Ultrasound done 11/07/2013 as follows: Uterus measures 6.6 x 4.6 x 3.2 cm with endometrial stripe of 3.2 mm. Right ovary and left ovary were both atrophic no apparent masses right left ovary normal echogenic pattern no fluid in the cul-de-sac.  Pertinent Gynecological History: Menses: post-menopausal Bleeding: none Contraception: post menopausal status DES exposure: unknown Blood transfusions: none Sexually transmitted diseases: no past history Previous GYN Procedures: DNC 2 NSVD Last mammogram: See above Date: see above Last pap: normal Date: 2014 OB History: G3, P2A1   Menstrual History: Menarche age: 76  No LMP recorded. Patient is postmenopausal.    Past Medical History  Diagnosis Date  . Migraines   . Cancer     breast  . Breast cancer   . Allergy   . BRCA1 positive 2015  . Anemia, unspecified 07/09/2013    chemo    Past Surgical History  Procedure Laterality Date  . Removal of cataracts    . Foot surgery  age 67    bone graft  . Portacath placement N/A 04/04/2013    Procedure: INSERTION PORT-A-CATH;  Surgeon: Haywood Lasso, MD;  Location: [facility];  Service: General;  Laterality: N/A;  . Mastectomy with axillary lymph node dissection Bilateral 09/30/2013    DR BOWERS  . Mastectomy with axillary lymph node dissection Bilateral 09/30/2013    Procedure: BILATERAL MASTECTOMIES RIGHT  AXILLARY LYMPH NODE BIOPSY;  Surgeon: Shann Medal, MD;  Location: [facility];  Service: General;  Laterality: Bilateral;  . Port-a-cath removal Left 09/30/2013    Procedure: REMOVAL PORT-A-CATH;  Surgeon: Shann Medal, MD;  Location: [facility];  Service: General;   Laterality: Left;  . Breast reconstruction with placement of tissue expander and flex hd (acellular hydrated dermis) Bilateral 09/30/2013    Procedure: BILATERAL TISSUE EXPANDERS ;  Surgeon: Crissie Reese, MD;  Location: [facility];  Service: Plastics;  Laterality: Bilateral;    Family History  Problem Relation Age of Onset  . Breast cancer Maternal Grandmother 68  . Pancreatic cancer Father 43  . Heart Problems Maternal Uncle   . Ovarian cancer Paternal Aunt     dx in her 49s-60s  . Stomach cancer Other     father's maternal grandmother  . Breast cancer Cousin     Father's maternal cousin's daughter; BRCA1+    Social History:  reports that she has never smoked. She has never used smokeless tobacco. She reports that she drinks alcohol. She reports that she does not use illicit drugs.  Allergies:  Allergies  Allergen Reactions  . Azithromycin Hives  . Penicillins Rash     (Not in a hospital admission)  REVIEW OF SYSTEMS: A ROS was performed and pertinent positives and negatives are included in the history.  GENERAL: No fevers or chills. HEENT: No change in vision, no earache, sore throat or sinus congestion. NECK: No pain or stiffness. CARDIOVASCULAR: No chest pain or pressure. No palpitations. PULMONARY: No shortness of breath, cough or wheeze. GASTROINTESTINAL: No abdominal pain, nausea, vomiting or diarrhea, melena or bright red blood per rectum. GENITOURINARY: No urinary frequency, urgency, hesitancy or dysuria. MUSCULOSKELETAL: No joint or muscle pain, no back pain, no recent trauma. DERMATOLOGIC: No rash, no itching, no lesions. ENDOCRINE: No polyuria, polydipsia, no heat or cold intolerance. No recent change in weight. HEMATOLOGICAL: No anemia or easy bruising or bleeding. NEUROLOGIC: No headache, seizures, numbness, tingling or weakness. PSYCHIATRIC: No depression, no loss of interest in normal activity or change in sleep pattern.     Blood pressure 110/70.  Physical  Exam:  HEENT:unremarkable Neck:Supple, midline, no thyroid megaly, no carotid bruits Lungs:  Clear to auscultation no rhonchi's or wheezes Heart:Regular rate and rhythm, no murmurs or gallops Breast Exam: Patient with evidence of breast reconstruction no palpable masses no supraclavicular axillary lymphadenopathy Abdomen: Soft nontender no rebound or guarding Pelvic:BUS within normal limits Vagina: No lesions or discharge Cervix: No lesions or discharge Uterus: Slightly anteverted normal size shape and consistency Adnexa: No palpable masses or tenderness Extremities: No cords, no edema Rectal: Unremarkable   Assessment/plan: Patient with breast cancer recent mastectomy completed chemotherapy positive BRCA1 high risk for ovarian cancer. Patient was counseled for laparoscopic bilateral salpingo-oophorectomy. The general surgeon Dr. Harlow Mares will be removing bilateral tissue breast expanders under the same anesthesia. Patient stated that Dr. Harlow Mares is 1 to start her on Lovenox prophylactically as well. The following risks were also discussed:                        Patient was counseled as to the risk of surgery to include the following:  1. Infection (prohylactic antibiotics will be administered)  2. DVT/Pulmonary Embolism (prophylactic pneumo compression stockings will be used)  3.Trauma to internal organs requiring additional surgical procedure to repair any injury to     Internal organs requiring perhaps additional hospitalization days.  4.Hemmorhage  requiring transfusion and blood products which carry risks such as   anaphylactic reaction, hepatitis and AIDS  Patient had received literature information on the procedure scheduled and all her questions were answered and fully accepts all risk.   [facility] HMD3:38 PMTD'@Note' : This dictation was prepared with  Dragon/digital dictation along withSmart phrase technology. Any transcriptional errors that result from this process are  unintentional.       Terrance Mass 01/14/2014, 2:27 PM  Note: This dictation was prepared with  Dragon/digital dictation along withSmart phrase technology. Any transcriptional errors that result from this process are unintentional.

## 2014-01-29 NOTE — Discharge Instructions (Signed)

## 2014-01-29 NOTE — Op Note (Signed)
Operative Note  01/29/2014  9:36 AM  PATIENT:  Janet [name]  58 y.o. female  PRE-OPERATIVE DIAGNOSIS:  personal history of breast cancer, BRACA 1 positivie  POST-OPERATIVE DIAGNOSIS:  personal history of breast cancer, BRACA 1 positivie  PROCEDURE:  Procedure(s): LAPAROSCOPIC BILATERAL SALPINGO OOPHORECTOMY (prophylactic)  REMOVAL OF BILATERAL TISSUE EXPANDER AND PLACEMENT OF IMPLANT FOR BREAST RECONSTRUCTION Dr. Harlow Mares ) separate dictation  SURGEON:  Surgeon(s): Janet Mass, MD Janet Auerbach, MD Janet Reese, MD  ANESTHESIA:   general  FINDINGS: Normal uterus, tubes, and ovary. Normal anterior-posterior cul-de-sac. Normal peritoneal surfaces. Smooth liver surface and appendix noted.  DESCRIPTION OF OPERATION: The patient was taken to the operating room where she underwent a successful general endotracheal anesthesia. The patient had received gentamicin, clindamycin and vancomycin for prophylaxis due to her penicillin allergy. She also had received 5000 units of heparin subcutaneous that had been prescribed by Dr. Harlow Mares for prophylaxis. A time out was undertaken properly identifying the patient as well as the procedure to be performed. A separate dictation by Dr. Harlow Mares to follow outlining the removal of bilateral tissue expanders and placement of implant for breast reconstruction.  Patient's abdomen vagina and perineum were prepped and draped in usual sterile fashion. Examination under anesthesia demonstrated slightly anteverted uterus normal size with no palpable adnexal masses. A Hulka tenaculum was placed for manipulation during laparoscopic procedure. A Foley catheter was inserted to monitor urinary output. After the drapes were in place a semilunar incision was made at the infraumbilical region and a 57/84 mm Optiview trocar was introduced into the abdominal cavity. Safe entrance was accomplished. A pneumoperitoneum with 2-1/2 L of carbon dioxide were utilized. 2  additional 5 mm ports were placed in the patient's right and left lower abdomen under laparoscopic visualization. Pelvic washings were obtained and submitted for cytological evaluation. The following was noted at time of laparoscopic assessment of the abdominal pelvic cavity:  Normal uterus, tubes, and ovary. Normal anterior-posterior cul-de-sac. Normal peritoneal surfaces. Smooth liver surface and appendix noted.  The right ureter was identified as well as the right infundibulopelvic ligament. The infundibulopelvic ligament close to the right ovary was coaptated and transected with the harmonic scalpel. The remainder of the mesal salpinx was coaptated and transected to the level of the uterotubal junction at which time this air was coaptated and transected as well. Similar procedure was carried out on the contralateral side. Both right tube and ovary were placed on an Endopouch and removed through the umbilicus incision and submitted for histological evaluation. Pre-and post-bilateral salpingo-oophorectomy pictures were obtained. Adequate hemostasis was noted. The pneumoperitoneum was removed. The subumbilical fascia was reapproximated with a running stitch of 0 Vicryl suture and the subcutaneous tissue was reapproximated with 3-0 Vicryl suture. All 3 port sites skin incisions were reapproximated with Dermabond glue. 0.25% Marcaine was infiltrated at all 3 port sites for a total of 10 cc. The Foley catheter was left in place. Dr. Harlow Mares followed with his procedure as noted above and will follow with a separate dictation.  ESTIMATED BLOOD LOSS: Minimal   Intake/Output Summary (Last 24 hours) at 01/29/14 0936 Last data filed at 01/29/14 0841  Gross per 24 hour  Intake   1000 ml  Output      0 ml  Net   1000 ml     BLOOD ADMINISTERED:none   LOCAL MEDICATIONS USED:  MARCAINE   0.25 percent subcutaneous at all 3 incisions ports for total of 10 cc   SPECIMEN:  Source of Specimen: Bilateral fallopian  tubes and ovary, pelvic washings  DISPOSITION OF SPECIMEN:  PATHOLOGY  COUNTS:  YES  PLAN OF CARE: Transfer to PACU  [facility] HMD9:36 AMTD@  Note: This dictation was prepared with  Dragon/digital dictation along withSmart phrase technology. Any transcriptional errors that result from this process are unintentional.

## 2014-01-30 ENCOUNTER — Encounter (HOSPITAL_COMMUNITY): Payer: Self-pay | Admitting: Gynecology

## 2014-01-30 MED FILL — Heparin Sodium (Porcine) Inj 5000 Unit/ML: INTRAMUSCULAR | Qty: 1 | Status: AC

## 2014-01-30 NOTE — Op Note (Signed)
Janet [name], BOBST                ACCOUNT NO.:  [id number]  MEDICAL RECORD NO.:  [id number]  LOCATION:  [facility]                          FACILITY:  [facility]  PHYSICIAN:  Crissie Reese, M.D.     DATE OF BIRTH:  12-Jul-1956  DATE OF PROCEDURE:  01/29/2014 DATE OF DISCHARGE:  01/29/2014                              OPERATIVE REPORT   PREOPERATIVE DIAGNOSIS:  Breast cancer.  POSTOPERATIVE DIAGNOSIS:  Breast cancer.  PROCEDURE PERFORMED:  Bilateral delayed breast reconstruction with saline implant and removal of tissue expander.  SURGEON:  Crissie Reese, M.D.  ANESTHESIA:  General.  ESTIMATED BLOOD LOSS:  50 mL.  DRAINS:  One 19-French on each side.  CLINICAL NOTE:  A 58 year old woman has had breast cancer and has had bilateral mastectomy and had tissue expanders placed.  She has now expanded and presents for planned stage procedure to remove the tissue expanders and delayed reconstruction of the breasts with saline implants.  She did elect saline implants as opposed to silicone gel and elected a 275 mL volume.  The nature of the procedure and risks plus complications were discussed with her in detail.  These risks include, but are not limited to, bleeding, infection, healing problems, loss of sensation, fluid accumulations, anesthesia-related complications, pneumothorax, DVT, PE, chronic pain, failure of device, capsular contracture, displacement of device, wrinkles, ripples, and asymmetry, contour deformities at the periphery of the implants, and overall disappointment.  She understood all this and wished to proceed.  In addition, she desired to have bilateral oophorectomy at the time of the breast reconstruction, and this was coordinated with her GYN surgeon at [facility]:  The patient was in the operating room, and the GYN procedure had been completed.  She was then prepped with ChloraPrep on her chest and then after waiting full 3 minutes  for drying, she was draped with sterile drapes including impervious Steri- drapes.  The old mastectomy scars were utilized, the dissection was carried down through subcutaneous tissue, and then a muscle-splitting incision was used to access the tissue expanders, which were deflated and gently removed.  The spaces were then prepared removing any proteinaceous material, irrigating thoroughly, and capsulotomy as needed superiorly.  Thorough irrigation with saline.  Antibiotic solution was placed and allowed to dwell on the space.  After thoroughly cleaning gloves, the implants were prepared.  These were Mentor high-profile saline, maximum fill 275 mL implants.  100 mL sterile saline placed using a closed filling system.  The implants were returned to the antibiotic solution to soak.  A 19-French drain was positioned on each side and brought out through separate stab wound, and each was placed with at least a 5-cm subcutaneous tunnel.  These were secured with 3-0 Prolene sutures.  3-0 Vicryl sutures were pre-placed and left untied for the muscle and capsule closure and antibiotic solution again placed in the space.  After thoroughly cleaning gloves, Betadine prep to the skin and thoroughly cleaning gloves, the implants were positioned, filled with maximum 275 mL sterile saline using closed filling system, and the antibiotic solution again placed and the 3-0 Vicryl sutures tied securely.  Antibiotic solution again, irrigation for the  wounds then 3-0 Monocryl interrupted inverted deep dermal sutures and running 3-0 Monocryl subcuticular suture completed the closure.  Dermabond was applied, and Polysporin antibiotic ointment for the drains.  Dry sterile dressings occlusive and ABDs over the wounds and chest vest positioned. She was transferred to the recovery room stable having tolerated the procedure well.  DISPOSITION:  She will follow up in the office next week.     Crissie Reese,  M.D.     DB/MEDQ  D:  01/29/2014  T:  01/30/2014  Job:  [id number]

## 2014-02-10 ENCOUNTER — Ambulatory Visit: Payer: BC Managed Care – PPO | Admitting: Gynecology

## 2014-02-19 ENCOUNTER — Encounter: Payer: Self-pay | Admitting: Gynecology

## 2014-02-19 ENCOUNTER — Ambulatory Visit (INDEPENDENT_AMBULATORY_CARE_PROVIDER_SITE_OTHER): Payer: BC Managed Care – PPO | Admitting: Gynecology

## 2014-02-19 VITALS — BP 112/70

## 2014-02-19 DIAGNOSIS — Z09 Encounter for follow-up examination after completed treatment for conditions other than malignant neoplasm: Secondary | ICD-10-CM

## 2014-02-19 MED ORDER — FLUCONAZOLE 150 MG PO TABS: 150.0000 mg | ORAL_TABLET | Freq: Once | ORAL | Status: DC

## 2014-02-19 NOTE — Progress Notes (Signed)
   Patient presented to the office for her-2 week postop visit. Patient status post prophylactic laparoscopic bilateral salpingo-oophorectomy as a result of patient's personal history of breast cancer and BRCA1 gene mutation positive. Patient has done well from her surgery and has been asymptomatic with the exception of some mild vaginal itching.  Findings from surgery were discussed with patient as follows: Normal uterus, tubes, and ovary. Normal anterior-posterior cul-de-sac. Normal peritoneal surfaces. Smooth liver surface and appendix noted.    pathology report: Diagnosis Ovaries and fallopian tubes, bilateral, pt has hx of breast cancer - BENIGN OVARIES WITH INCLUSION CYSTS. - BENIGN FALLOPIAN TUBES, ONE WITH PARATUBAL CYST. - NO MALIGNANCY.  Exam: All 3 incisions for completely healed. Abdomen soft nontender no rebound or guarding Pelvic: Bartholin urethra Skene was within normal limits vagina no lesions or discharge bimanual exam no palpable mass or tenderness Rectal exam not done  Assessment/plan: Patient status post prophylactic laparoscopic bilateral salpingo-oophorectomy and patient with personal history of breast cancer and BRCA1 gene mutation and has done well. For her mild yeast infection she can use Monistat cream over-the-counter. We will see her back in one year or when necessary.

## 2014-02-20 ENCOUNTER — Encounter: Payer: Self-pay | Admitting: Oncology

## 2014-02-20 NOTE — Progress Notes (Signed)
Patient left message and I called back-----I left her a message to call billing and check to make sure it is paid. It looks like it has been, but she needs to call and check before.

## 2014-04-17 ENCOUNTER — Other Ambulatory Visit (HOSPITAL_BASED_OUTPATIENT_CLINIC_OR_DEPARTMENT_OTHER): Payer: BC Managed Care – PPO

## 2014-04-17 ENCOUNTER — Other Ambulatory Visit: Payer: Self-pay | Admitting: *Deleted

## 2014-04-17 ENCOUNTER — Telehealth: Payer: Self-pay | Admitting: Oncology

## 2014-04-17 ENCOUNTER — Ambulatory Visit (HOSPITAL_BASED_OUTPATIENT_CLINIC_OR_DEPARTMENT_OTHER): Payer: BC Managed Care – PPO | Admitting: Oncology

## 2014-04-17 VITALS — BP 118/70 | HR 68 | Temp 98.0°F | Resp 18 | Ht 68.5 in | Wt 123.7 lb

## 2014-04-17 DIAGNOSIS — Z1509 Genetic susceptibility to other malignant neoplasm: Principal | ICD-10-CM

## 2014-04-17 DIAGNOSIS — Z79810 Long term (current) use of selective estrogen receptor modulators (SERMs): Secondary | ICD-10-CM

## 2014-04-17 DIAGNOSIS — Z1501 Genetic susceptibility to malignant neoplasm of breast: Secondary | ICD-10-CM

## 2014-04-17 DIAGNOSIS — Z853 Personal history of malignant neoplasm of breast: Secondary | ICD-10-CM

## 2014-04-17 DIAGNOSIS — C50411 Malignant neoplasm of upper-outer quadrant of right female breast: Secondary | ICD-10-CM

## 2014-04-17 NOTE — Telephone Encounter (Signed)
, °

## 2014-04-17 NOTE — Progress Notes (Signed)
Patient ID: Janet [name], female   DOB: 05/17/1956, 58 y.o.   MRN: [id number] ID: Janet [name] OB: [birth year]  MR#: [id number]  XKG#:[id number]  PCP: Janet Casco, MD GYN:  Dr. Uvaldo [name];  Dr. Donalynn [name] SU: Janet [name]; Janet [name] OTHER MD: Janet [name], Janet [name];  Janet Reese, MD  CHIEF COMPLAINTS:   "I had my final surgery"  BREAST CANCER HISTORY: : "Janet [name]" [name] noted a lump in her right breast on 03/17/2013, and immediately contacted her physician so that on 03/18/2013 bilateral diagnostic mammography and right breast ultrasonography at the breast Center showed a developing [name] in the subareolar region of the right breast. There were no malignant type calcifications. This [name] was palpable in the upper outer quadrant, and by ultrasound measured 2.6 cm. Ultrasonography of the right axilla showed a lymph node measuring 1.1 cm, with a thickened cortex. The left breast was unremarkable.  Biopsy of the right breast [name] 08/26//2014 showed ([name]) and invasive ductal carcinoma, grade 3, triple negative, with an MIB-1 of 54%. Biopsy of the right axillary lymph node in question at the same time was negative.  Bilateral breast MRI 03/26/2013 measured the right subareolar [name] at 3.8 cm. The previously biopsied benign right axillary lymph node was again noted. There were no other suspicious areas in the right breast or axilla and the left breast was unremarkable.  Her subsequent history is as detailed below  INTERVAL HISTORY: Janet [name] returns today for followup of her locally advanced right breast cancer. Since her last visit here she had her bilateral salpingo-oophorectomy under Dr. Toney [name]. The pathology ([id number]) was benign.  REVIEW OF SYSTEMS: Janet [name] is doing terrific and has a completely negative review of systems. She is running every day. She is working full-time. 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 A detailed review of systems today was entirely negative.  PAST MEDICAL HISTORY: Past Medical History  Diagnosis Date  . Cancer     breast  . Breast cancer   . Allergy   . BRCA1 positive 2015  . Anemia, unspecified 07/09/2013    chemo  . Migraines     PAST SURGICAL HISTORY: Past Surgical History  Procedure Laterality Date  . Removal of cataracts    . Foot surgery  age 21    bone graft  . Portacath placement N/A 04/04/2013    Procedure: INSERTION PORT-A-CATH;  Surgeon: Janet Lasso, MD;  Location: [facility];  Service: General;  Laterality: N/A;  . Mastectomy with axillary lymph node dissection Bilateral 09/30/2013    DR [name]  . Mastectomy with axillary lymph node dissection Bilateral 09/30/2013    Procedure: BILATERAL MASTECTOMIES RIGHT  AXILLARY LYMPH NODE BIOPSY;  Surgeon: Janet Medal, MD;  Location: [facility];  Service: General;  Laterality: Bilateral;  . Port-a-cath removal Left 09/30/2013    Procedure: REMOVAL PORT-A-CATH;  Surgeon: Janet Medal, MD;  Location: [facility];  Service: General;  Laterality: Left;  . Breast reconstruction with placement of tissue expander and flex hd (acellular hydrated dermis) Bilateral 09/30/2013    Procedure: BILATERAL TISSUE EXPANDERS ;  Surgeon: Janet Reese, MD;  Location: [facility];  Service: Plastics;  Laterality: Bilateral;  . Laparoscopic bilateral salpingo oopherectomy N/A 01/29/2014    Procedure: LAPAROSCOPIC BILATERAL SALPINGO OOPHORECTOMY;  Surgeon: Janet Mass, MD;  Location: [facility];  Service: Gynecology;  Laterality: N/A;  Dr. Phineas [name] to assist.  Dr. Crissie [name] will be following with some reconstructive breast surgery.  His case will take approx 1 1/2 hours and his scheduler will post her part once we have a date. Thanks!  . Removal of tissue expander and placement of implant Bilateral 01/29/2014    Procedure: REMOVAL OF BILATERAL TISSUE EXPANDER AND PLACEMENT OF IMPLANT  FOR BREAST RECONSTRUCTION;  Surgeon: Janet Reese, MD;  Location: [facility];  Service: Plastics;  Laterality: Bilateral;    FAMILY HISTORY Family History  Problem Relation Age of Onset  . Breast cancer Maternal Grandmother 68  . Pancreatic cancer Father 75  . Heart Problems Maternal Uncle   . Ovarian cancer Paternal Aunt     dx in her 5s-60s  . Stomach cancer Other     father's maternal grandmother  . Breast cancer Cousin     Father's maternal cousin's daughter; Janet [name]  the patient's father died at the age of 59 with pancreatic cancer. (He was Janet [name]). [name] is alive at age 104. Janet [name] has one brother, no sisters. [name]'s mother died from breast cancer at the age of 96. There is no history of ovarian cancer in the family.-- The patient has advised her children and the rest of her family regarding her being BRCA+  GYNECOLOGIC HISTORY:  Menarche age 16, first live birth age 27. She is GX P2. She stopped having periods approximately 2004. She did not take hormone replacement. She did take birth control pills remotely for 5-10 years, with no complications.  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 Her husband the [place]. Janet [name], is a local judge. The patient's 2 children are Janet [name] who lives in [place] and works as a Public affairs consultant, and Janet [name], who works in [place] and is an associate with a Interior and spatial designer group.   ADVANCED DIRECTIVES: In place.  Patient was also given information about advanced directives today so that she may update her healthcare power of attorney. (09/03/2013)   HEALTH MAINTENANCE: (Updated February 2015) History  Substance Use Topics  . Smoking status: Never Smoker   . Smokeless  tobacco: Never Used  . Alcohol Use: Yes     Comment: occasional     Colonoscopy: Not on file  PAP:  Oct 2014, Dr. Laurann Montana  Bone density: July 2013 at [facility], showed osteopenia (T-1.9)  Lipid panel: UTD, Dr. Laurann Montana   Allergies  Allergen Reactions  . Azithromycin Hives  . Penicillins Rash    Childhood reaction    Current Outpatient Prescriptions  Medication Sig Dispense Refill  . acetaminophen (TYLENOL) 650 MG CR tablet Take 650 mg by mouth every 8 (eight) hours as needed (for headache).      . calcium citrate-vitamin D (CITRACAL+D) 315-200 MG-UNIT per tablet Take 1 tablet by mouth daily.      . fluconazole (DIFLUCAN) 150 MG tablet Take 1 tablet (150 mg total) by mouth once.  1 tablet  2  . Multiple Vitamin (MULTIVITAMIN WITH MINERALS) TABS tablet Take 1 tablet by mouth daily.      Marland Kitchen sulfamethoxazole-trimethoprim (BACTRIM DS) 800-160 MG per tablet Take 1 tablet by mouth 2 (two) times daily.  20 tablet  0   No current facility-administered medications for this  visit.   Facility-Administered Medications Ordered in Other Visits  Medication Dose Route Frequency Provider Last Rate Last Dose  . sodium chloride 0.9 % injection 10 mL  10 mL Intracatheter PRN Theotis Burrow, PA-C   10 mL at 09/04/13 1652    OBJECTIVE: Middle-aged white woman who appears healthy Filed Vitals:   04/17/14 0903  BP: 118/70  Pulse: 68  Temp: 98 F (36.7 C)  Resp: 18     Body [name] index is 18.53 kg/(m^2).    ECOG FS:1 Filed Weights   04/17/14 0903  Weight: 123 lb 11.2 oz (56.11 kg)   Sclerae unicteric, EOMs intact Oropharynx clear and moist-- dentition in good repair No cervical or supraclavicular adenopathy Lungs no rales or rhonchi Heart regular rate and rhythm Abd soft, nontender, positive bowel sounds MSK no focal spinal tenderness, no upper extremity lymphedema Neuro: nonfocal, well oriented, appropriate affect Breasts: Status post bilateral mastectomy with implant reconstruction.  There is no evidence of chest wall recurrence. Both axillae are benign Skin: The toenails are still not completely grown back, but was coming in his normal.    LAB RESULTS:  CMP     Component Value Date/Time   NA 141 01/27/2014 1445   NA 141 09/17/2013 1324   K 4.7 01/27/2014 1445   K 3.6 09/17/2013 1324   CL 101 01/27/2014 1445   CO2 31 01/27/2014 1445   CO2 24 09/17/2013 1324   GLUCOSE 94 01/27/2014 1445   GLUCOSE 89 09/17/2013 1324   BUN 14 01/27/2014 1445   BUN 15.0 09/17/2013 1324   CREATININE 0.81 01/27/2014 1445   CREATININE 0.7 09/17/2013 1324   CALCIUM 11.5* 01/27/2014 1445   CALCIUM 10.4 09/17/2013 1324   PROT 6.8 09/17/2013 1324   ALBUMIN 4.4 09/17/2013 1324   AST 22 09/17/2013 1324   ALT 25 09/17/2013 1324   ALKPHOS 63 09/17/2013 1324   BILITOT 0.52 09/17/2013 1324   GFRNONAA 79* 01/27/2014 1445   GFRAA >90 01/27/2014 1445     Lab Results  Component Value Date   WBC 5.3 01/27/2014   NEUTROABS 1.2* 09/17/2013   HGB 14.8 01/27/2014   HCT 42.0 01/27/2014   MCV 94.0 01/27/2014   PLT 171 01/27/2014      STUDIES: Nm Sentinel Node Inj-no Rpt (breast)  09/30/2013   CLINICAL DATA: Right breast cancer, BRCA 1   Sulfur colloid was injected intradermally by the nuclear medicine  technologist for breast cancer sentinel node localization.      ASSESSMENT: 58 y.o. BRCA1 positive [place] woman   (1)  status post right breast biopsy 03/19/2013 for a clinical T2 N0, stage IIA invasive ductal breast cancer, grade 3, triple negative, with an MIB-1 of 54%.  (2) suspicious right axillary lymph node biopsy negative 03/19/2013  (3) received four cycles of dose dense doxorubicin/ cyclophosphamide between 04/17/2013 and 05/29/2013 , at standard doses, with Neulasta support;  (4) on  06/12/2013, started 12 weekly doses of carboplatin/ paclitaxel.  One treatment was held in January secondary to chemotherapy-induced neutropenia, and that cycle was not "made up". Accordingly, patient received a total of 11 weekly  doses, with final dose on 09/04/2013.  (5) s/p bilateral mastectomies with immediate expander placement and right-sided sentinel lymph node sampling 09/30/2013 for a residual ypT1a ypN0 invasive ductal carcinoma, grade not ascertained on this sample (read off original biopsy), repeat prognostic panel again progesterone and HER-2 negative, but now estrogen receptor positive at 46% with moderate staining intensity.  (6) s/p bilateral salpingo-oophorectomy  01/29/2014 with benign pathology  PLAN: Janet [name] has completed her treatments and preventative surgeries and has done absolutely terrific with all of that. The only remaining question is whether we should add tamoxifen at this point or not.  If all we were dealing with was a T1 A. estrogen receptor positive tumor, NCCN guidelines suggest we "consider it" antiestrogen meds. That is what we did today. Generally the benefit is minimal and that setting, because the results with local treatment alone is so good.  If she hadn't kept her breast, we would consider tamoxifen for prevention. Consideration.  Accordingly at this point I am very comfortable with her not taking tamoxifen, and that is her preference.  We are therefore starting observation/followup. She is going to see me April and October of next year and then April for the subsequent 3 years until she completes 5 years of followup.  Janet [name] has a good understanding of the [name] plan. She agrees with it. She knows to call for any problems that may develop before her next visit here.

## 2014-04-22 NOTE — Addendum Note (Signed)
Addended by: Laureen Abrahams on: 04/22/2014 05:46 PM   Modules accepted: Orders, Medications

## 2014-05-26 ENCOUNTER — Encounter: Payer: Self-pay | Admitting: Gynecology

## 2014-11-13 ENCOUNTER — Other Ambulatory Visit (HOSPITAL_BASED_OUTPATIENT_CLINIC_OR_DEPARTMENT_OTHER): Payer: BC Managed Care – PPO

## 2014-11-13 DIAGNOSIS — Z853 Personal history of malignant neoplasm of breast: Secondary | ICD-10-CM | POA: Diagnosis not present

## 2014-11-13 DIAGNOSIS — C50411 Malignant neoplasm of upper-outer quadrant of right female breast: Secondary | ICD-10-CM

## 2014-11-13 DIAGNOSIS — Z1501 Genetic susceptibility to malignant neoplasm of breast: Secondary | ICD-10-CM

## 2014-11-13 DIAGNOSIS — Z1509 Genetic susceptibility to other malignant neoplasm: Principal | ICD-10-CM

## 2014-11-13 LAB — COMPREHENSIVE METABOLIC PANEL (CC13)
ALBUMIN: 4.1 g/dL (ref 3.5–5.0)
ALT: 34 U/L (ref 0–55)
AST: 28 U/L (ref 5–34)
Alkaline Phosphatase: 71 U/L (ref 40–150)
Anion Gap: 10 mEq/L (ref 3–11)
BUN: 14.5 mg/dL (ref 7.0–26.0)
CO2: 21 mEq/L — ABNORMAL LOW (ref 22–29)
Calcium: 10.7 mg/dL — ABNORMAL HIGH (ref 8.4–10.4)
Chloride: 108 mEq/L (ref 98–109)
Creatinine: 0.8 mg/dL (ref 0.6–1.1)
EGFR: 80 mL/min/{1.73_m2} — AB (ref >90)
Glucose: 107 mg/dl (ref 70–140)
POTASSIUM: 4.2 meq/L (ref 3.5–5.1)
SODIUM: 139 meq/L (ref 136–145)
TOTAL PROTEIN: 6.4 g/dL (ref 6.4–8.3)
Total Bilirubin: 0.53 mg/dL (ref 0.20–1.20)

## 2014-11-13 LAB — CBC WITH DIFFERENTIAL/PLATELET
BASO%: 0.2 % (ref 0.0–2.0)
BASOS ABS: 0 10*3/uL (ref 0.0–0.1)
EOS ABS: 0.1 10*3/uL (ref 0.0–0.5)
EOS%: 1.2 % (ref 0.0–7.0)
HCT: 40.5 % (ref 34.8–46.6)
HEMOGLOBIN: 14.1 g/dL (ref 11.6–15.9)
LYMPH%: 24.8 % (ref 14.0–49.7)
MCH: 32.8 pg (ref 25.1–34.0)
MCHC: 34.8 g/dL (ref 31.5–36.0)
MCV: 94.2 fL (ref 79.5–101.0)
MONO#: 0.3 10*3/uL (ref 0.1–0.9)
MONO%: 6.3 % (ref 0.0–14.0)
NEUT%: 67.5 % (ref 38.4–76.8)
NEUTROS ABS: 2.8 10*3/uL (ref 1.5–6.5)
Platelets: 170 10*3/uL (ref 145–400)
RBC: 4.3 10*6/uL (ref 3.70–5.45)
RDW: 12.1 % (ref 11.2–14.5)
WBC: 4.2 10*3/uL (ref 3.9–10.3)
lymph#: 1 10*3/uL (ref 0.9–3.3)

## 2014-11-20 ENCOUNTER — Telehealth: Payer: Self-pay | Admitting: Oncology

## 2014-11-20 ENCOUNTER — Ambulatory Visit (HOSPITAL_BASED_OUTPATIENT_CLINIC_OR_DEPARTMENT_OTHER): Payer: BC Managed Care – PPO | Admitting: Oncology

## 2014-11-20 VITALS — BP 119/71 | HR 66 | Temp 98.2°F | Resp 18 | Ht 68.5 in | Wt 122.7 lb

## 2014-11-20 DIAGNOSIS — D696 Thrombocytopenia, unspecified: Secondary | ICD-10-CM

## 2014-11-20 DIAGNOSIS — G43009 Migraine without aura, not intractable, without status migrainosus: Secondary | ICD-10-CM

## 2014-11-20 DIAGNOSIS — C50411 Malignant neoplasm of upper-outer quadrant of right female breast: Secondary | ICD-10-CM

## 2014-11-20 DIAGNOSIS — Z1501 Genetic susceptibility to malignant neoplasm of breast: Secondary | ICD-10-CM

## 2014-11-20 DIAGNOSIS — M8580 Other specified disorders of bone density and structure, unspecified site: Secondary | ICD-10-CM | POA: Diagnosis not present

## 2014-11-20 DIAGNOSIS — Z1509 Genetic susceptibility to other malignant neoplasm: Principal | ICD-10-CM

## 2014-11-20 DIAGNOSIS — Z853 Personal history of malignant neoplasm of breast: Secondary | ICD-10-CM | POA: Diagnosis not present

## 2014-11-20 MED ORDER — IBUPROFEN 800 MG PO TABS: ORAL_TABLET | ORAL | Status: DC

## 2014-11-20 NOTE — Progress Notes (Signed)
Patient ID: Janet [name], female   DOB: 1956/03/30, 59 y.o.   MRN: [id number] ID: [name] OB: 06-07-1956  MR#: [id number]  NWG#:[id number]  PCP: Osborne Casco, MD GYN:  Dr. Uvaldo Rising;  Dr. Donalynn Furlong SU: Osborn Coho; [name] OTHER MD: Arloa Koh, Juanito Doom;  Crissie Reese, MD  CHIEF COMPLAINTS:   Early stage breast cancer   CURRENT TREATMENT: Observation    BREAST CANCER HISTORY: From the original intake note: : "Janet [name]" herself noted a lump in her right breast on 03/17/2013, and immediately contacted her physician so that on 03/18/2013 bilateral diagnostic mammography and right breast ultrasonography at the breast Center showed a developing mass in the subareolar region of the right breast. There were no malignant type calcifications. This mass was palpable in the upper outer quadrant, and by ultrasound measured 2.6 cm. Ultrasonography of the right axilla showed a lymph node measuring 1.1 cm, with a thickened cortex. The left breast was unremarkable.  Biopsy of the right breast mass 08/26//2014 showed ([name]) and invasive ductal carcinoma, grade 3, triple negative, with an MIB-1 of 54%. Biopsy of the right axillary lymph node in question at the same time was negative.  Bilateral breast MRI 03/26/2013 measured the right subareolar mass at 3.8 cm. The previously biopsied benign right axillary lymph node was again noted. There were no other suspicious areas in the right breast or axilla and the left breast was unremarkable.  Her subsequent history is as detailed below  INTERVAL HISTORY: Janet [name] returns today for followup of her locally advanced right breast cancer. 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 Family is doing "wonderful", with her daughter at [employer] and also applying for an Social research officer, government position.  REVIEW OF SYSTEMS: Janet [name] has a rash  in her chest she wants me to look at. Her big toes have some problems and she wants me to look at that as well. She wonders if she is ready now to give blood. She has rare problems with migraines, for which she takes an over-the-counter medication. Otherwise a detailed review of systems today was entirely negative.  PAST MEDICAL HISTORY: Past Medical History  Diagnosis Date  . Cancer     breast  . Breast cancer   . Allergy   . BRCA1 positive 2015  . Anemia, unspecified 07/09/2013    chemo  . Migraines     PAST SURGICAL HISTORY: Past Surgical History  Procedure Laterality Date  . Removal of cataracts    . Foot surgery  age 75    bone graft  . Portacath placement N/A 04/04/2013    Procedure: INSERTION PORT-A-CATH;  Surgeon: Haywood Lasso, MD;  [facility];  Service: General;  Laterality: N/A;  . Mastectomy with axillary lymph node dissection Bilateral 09/30/2013    DR BOWERS  . Mastectomy with axillary lymph node dissection Bilateral 09/30/2013    Procedure: BILATERAL MASTECTOMIES RIGHT  AXILLARY LYMPH NODE BIOPSY;  Surgeon: Shann Medal, MD;  Location: [facility];  Service: General;  Laterality: Bilateral;  . Port-a-cath removal Left 09/30/2013    Procedure: REMOVAL PORT-A-CATH;  Surgeon: Shann Medal, MD;  Location: [facility];  Service: General;  Laterality: Left;  . Breast reconstruction with placement of tissue expander and flex hd (acellular hydrated dermis) Bilateral 09/30/2013    Procedure: BILATERAL TISSUE EXPANDERS ;  Surgeon: Crissie Reese, MD;  Location: [facility];  Service: Clinical cytogeneticist;  Laterality: Bilateral;  . Laparoscopic bilateral salpingo oopherectomy N/A 01/29/2014    Procedure: LAPAROSCOPIC BILATERAL SALPINGO OOPHORECTOMY;  Surgeon: Terrance Mass, MD;  Location: [facility];  Service: Gynecology;  Laterality: N/A;  Dr. Phineas Real to assist.  Dr. Crissie Reese will be following with some reconstructive breast surgery.  His case will take approx 1 1/2 hours and his scheduler will  post her part once we have a date. Thanks!  . Removal of tissue expander and placement of implant Bilateral 01/29/2014    Procedure: REMOVAL OF BILATERAL TISSUE EXPANDER AND PLACEMENT OF IMPLANT FOR BREAST RECONSTRUCTION;  Surgeon: Crissie Reese, MD;  Location: [facility];  Service: Plastics;  Laterality: Bilateral;    FAMILY HISTORY Family History  Problem Relation Age of Onset  . Breast cancer Maternal Grandmother 68  . Pancreatic cancer Father 58  . Heart Problems Maternal Uncle   . Ovarian cancer Paternal Aunt     dx in her 65s-60s  . Stomach cancer Other     father's maternal grandmother  . Breast cancer Cousin     Father's maternal cousin's daughter; Alto [place]  the patient's father died at the age of 68 with pancreatic cancer. (He was Dr. Barbee Shropshire). [name] is alive at age 70. Janet [name] has one brother, no sisters. [name]'s mother died from breast cancer at the age of 77. There is no history of ovarian cancer in the family.-- The patient has advised her children and the rest of her family regarding her being BRCA+  GYNECOLOGIC HISTORY:  Menarche age 43, first live birth age 74. She is GX P2. She stopped having periods approximately 2004. She did not take hormone replacement. She did take birth control pills remotely for 5-10 years, with no complications.  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 Her husband the [place]. AQuentin Cornwall "Robby" All, is a local judge. The patient's 2 children are [name] who lives in [place] and works as a Public affairs consultant, and [name], who works in [place] and is an associate with a Interior and spatial designer group.   ADVANCED DIRECTIVES: In place.  Patient was also given information about advanced directives today so  that she may update her healthcare power of attorney. (09/03/2013)   HEALTH MAINTENANCE: (Updated February 2015) History  Substance Use Topics  . Smoking status: Never Smoker   . Smokeless tobacco: Never Used  . Alcohol Use: Yes     Comment: occasional     Colonoscopy: Not on file  PAP:  Oct 2014, Dr. Laurann Montana  Bone density: July 2013 at [facility], showed osteopenia (T-1.9)  Lipid panel: UTD, Dr. Laurann Montana   Allergies  Allergen Reactions  . Azithromycin Hives  . Penicillins Rash    Childhood reaction    Current Outpatient Prescriptions  Medication Sig Dispense Refill  . ibuprofen (ADVIL,MOTRIN) 800 MG tablet As needed for migraines 30 tablet 0  . Multiple Vitamin (MULTIVITAMIN) capsule Take 1 capsule by mouth daily.     No current facility-administered medications for this visit.    OBJECTIVE: Middle-aged white woman in no acute distress Filed Vitals:   11/20/14 1208  BP: 119/71  Pulse: 66  Temp: 98.2 F (36.8 C)  Resp: 18  Body mass index is 18.38 kg/(m^2).    ECOG FS:0 Filed Weights   11/20/14 1208  Weight: 122 lb 11.2 oz (55.656 kg)   Sclerae unicteric, pupils round and equal Oropharynx clear, good dentition No cervical or supraclavicular adenopathy Lungs no rales or rhonchi Heart regular rate and rhythm Abd soft, nontender, positive bowel sounds MSK no focal spinal tenderness, no upper extremity lymphedema Neuro: nonfocal, well oriented, positive affect Breasts: Status post bilateral mastectomy with bilateral implant reconstruction. There is no evidence of local recurrence. Both axillae are benign Skin: She has a scattered erythematous nonpalpable rash over the anterior chest, which we may be a "heat rash" . It is not suspicious for subcutaneous breast cancer recurrence. The left toenail is still not completely grown in, it is about halfway there. On the right the nail appears normal but it is somewhat ingrown. There is no erythema however in the soft  tissue around the nail    LAB RESULTS:  CMP     Component Value Date/Time   NA 139 11/13/2014 1012   NA 141 01/27/2014 1445   K 4.2 11/13/2014 1012   K 4.7 01/27/2014 1445   CL 101 01/27/2014 1445   CO2 21* 11/13/2014 1012   CO2 31 01/27/2014 1445   GLUCOSE 107 11/13/2014 1012   GLUCOSE 94 01/27/2014 1445   BUN 14.5 11/13/2014 1012   BUN 14 01/27/2014 1445   CREATININE 0.8 11/13/2014 1012   CREATININE 0.81 01/27/2014 1445   CALCIUM 10.7* 11/13/2014 1012   CALCIUM 11.5* 01/27/2014 1445   PROT 6.4 11/13/2014 1012   ALBUMIN 4.1 11/13/2014 1012   AST 28 11/13/2014 1012   ALT 34 11/13/2014 1012   ALKPHOS 71 11/13/2014 1012   BILITOT 0.53 11/13/2014 1012   GFRNONAA 79* 01/27/2014 1445   GFRAA >90 01/27/2014 1445     Lab Results  Component Value Date   WBC 4.2 11/13/2014   NEUTROABS 2.8 11/13/2014   HGB 14.1 11/13/2014   HCT 40.5 11/13/2014   MCV 94.2 11/13/2014   PLT 170 11/13/2014      STUDIES: No results found.   ASSESSMENT: 59 y.o. BRCA1 positive [place] woman   (1)  status post right breast biopsy 03/19/2013 for a clinical T2 N0, stage IIA invasive ductal breast cancer, grade 3, triple negative, with an MIB-1 of 54%.  (2) suspicious right axillary lymph node biopsy negative 03/19/2013  (3) received four cycles of dose dense doxorubicin/ cyclophosphamide between 04/17/2013 and 05/29/2013, at standard doses, with Neulasta support;  (4) on 06/12/2013 started 12 weekly doses of carboplatin/ paclitaxel.  One treatment was held in January secondary to chemotherapy-induced neutropenia, and that cycle was not "made up". Accordingly, patient received a total of 11 weekly doses, with final dose on 09/04/2013.  (5) s/p bilateral mastectomies with immediate expander placement and right-sided sentinel lymph node sampling 09/30/2013 for a residual ypT1a ypN0 invasive ductal carcinoma, grade not ascertained on this sample (read off original biopsy), repeat prognostic  panel again progesterone and HER-2 negative, but now estrogen receptor positive at 46% with moderate staining intensity.  (a) considered anti-estrogen therapy but decided against it  (6) s/p bilateral salpingo-oophorectomy 01/29/2014 with benign pathology  PLAN: Janet [name] is doing fine from a breast cancer point of view. She is going to be seeing her primary care physician, Dr. Laurann Montana, in October. She would prefer to be seen by each one of [place] once a year, and I am not uncomfortable with that at this point.  Because she  is a runner and her big toenails are giving her some trouble I think she might consider seeing a podiatrist and I gave her a couple of names to consider.  I offered her Imitrex for her migraines but she is happy with what she is taking.  Her blood calcium is a little bit on the high side. I suggested she stop calcium supplementation. She can take vitamin D alone, thousand milligrams, or simply continue her multivitamin which also includes vitamin D.  She had a bone density about 3 years ago which showed osteopenia. That probably should be repeated this year. If it is and it shows worsening osteopenia we could consider zolendronate (Reclast) which would not only take care of that problem but also reduce her risk of breast cancer recurrence  Janet [name] has a good understanding of this plan. She agrees with it. She knows a goal of treatment in her case is cure. She will call with any problems that may develop before her next visit here.

## 2014-11-20 NOTE — Telephone Encounter (Signed)
gave and printed appt sched and avs fo rpt for April 2017 °

## 2015-10-26 ENCOUNTER — Other Ambulatory Visit: Payer: Self-pay | Admitting: Oncology

## 2015-10-26 DIAGNOSIS — C50411 Malignant neoplasm of upper-outer quadrant of right female breast: Secondary | ICD-10-CM

## 2015-10-26 NOTE — Progress Notes (Unsigned)
Janet [name] returns today with her husband Janet [name] for an unscheduled visit. She has noted a change in her left implant and wanted me to look at it.  She continues to run daily, teaches full-time, and "I feel great".  Today's physical exam is entirely unremarkable.  She is status post bilateral mastectomies. In the left implant just below the mid breast scar there is a linear area of mild induration which is not erythematous or tender and which most likely represents degenerative changes in the implant itself.  I don't know of the way of evaluating this other than with MRI so we discussed that today I have gone ahead and entered the order. We will call her with results.  Because I anticipate this being benign, I am not changing her further follow-up appointment here, which will be in October.

## 2015-11-03 ENCOUNTER — Other Ambulatory Visit: Payer: Self-pay | Admitting: *Deleted

## 2015-11-03 DIAGNOSIS — C50411 Malignant neoplasm of upper-outer quadrant of right female breast: Secondary | ICD-10-CM

## 2015-11-06 ENCOUNTER — Other Ambulatory Visit: Payer: Self-pay | Admitting: Oncology

## 2015-11-06 ENCOUNTER — Ambulatory Visit
Admission: RE | Admit: 2015-11-06 | Discharge: 2015-11-06 | Disposition: A | Discharge status: Home / Self Care | Payer: BC Managed Care – PPO | Source: Ambulatory Visit | Attending: Oncology | Admitting: Oncology

## 2015-11-06 DIAGNOSIS — C50919 Malignant neoplasm of unspecified site of unspecified female breast: Secondary | ICD-10-CM

## 2015-11-09 ENCOUNTER — Other Ambulatory Visit: Payer: Self-pay

## 2015-11-09 DIAGNOSIS — D696 Thrombocytopenia, unspecified: Secondary | ICD-10-CM

## 2015-11-09 DIAGNOSIS — C50411 Malignant neoplasm of upper-outer quadrant of right female breast: Secondary | ICD-10-CM

## 2015-11-10 ENCOUNTER — Other Ambulatory Visit (HOSPITAL_BASED_OUTPATIENT_CLINIC_OR_DEPARTMENT_OTHER): Payer: BC Managed Care – PPO

## 2015-11-10 DIAGNOSIS — C50411 Malignant neoplasm of upper-outer quadrant of right female breast: Secondary | ICD-10-CM

## 2015-11-10 DIAGNOSIS — D696 Thrombocytopenia, unspecified: Secondary | ICD-10-CM

## 2015-11-10 LAB — COMPREHENSIVE METABOLIC PANEL
ALBUMIN: 3.9 g/dL (ref 3.5–5.0)
ALK PHOS: 64 U/L (ref 40–150)
ALT: 43 U/L (ref 0–55)
AST: 40 U/L — ABNORMAL HIGH (ref 5–34)
Anion Gap: 7 mEq/L (ref 3–11)
BILIRUBIN TOTAL: 0.35 mg/dL (ref 0.20–1.20)
BUN: 18.9 mg/dL (ref 7.0–26.0)
CALCIUM: 10.6 mg/dL — AB (ref 8.4–10.4)
CO2: 27 mEq/L (ref 22–29)
Chloride: 106 mEq/L (ref 98–109)
Creatinine: 0.8 mg/dL (ref 0.6–1.1)
EGFR: 81 mL/min/{1.73_m2} — AB (ref >90)
Glucose: 89 mg/dl (ref 70–140)
POTASSIUM: 4.1 meq/L (ref 3.5–5.1)
Sodium: 140 mEq/L (ref 136–145)
TOTAL PROTEIN: 6.6 g/dL (ref 6.4–8.3)

## 2015-11-10 LAB — CBC WITH DIFFERENTIAL/PLATELET
BASO%: 0.6 % (ref 0.0–2.0)
BASOS ABS: 0 10*3/uL (ref 0.0–0.1)
EOS ABS: 0.1 10*3/uL (ref 0.0–0.5)
EOS%: 2.4 % (ref 0.0–7.0)
HEMATOCRIT: 40.8 % (ref 34.8–46.6)
HEMOGLOBIN: 13.6 g/dL (ref 11.6–15.9)
LYMPH#: 1.2 10*3/uL (ref 0.9–3.3)
LYMPH%: 23.7 % (ref 14.0–49.7)
MCH: 31.6 pg (ref 25.1–34.0)
MCHC: 33.3 g/dL (ref 31.5–36.0)
MCV: 94.7 fL (ref 79.5–101.0)
MONO#: 0.4 10*3/uL (ref 0.1–0.9)
MONO%: 8.1 % (ref 0.0–14.0)
NEUT%: 65.2 % (ref 38.4–76.8)
NEUTROS ABS: 3.2 10*3/uL (ref 1.5–6.5)
PLATELETS: 192 10*3/uL (ref 145–400)
RBC: 4.31 10*6/uL (ref 3.70–5.45)
RDW: 12.4 % (ref 11.2–14.5)
WBC: 4.9 10*3/uL (ref 3.9–10.3)

## 2015-11-12 ENCOUNTER — Inpatient Hospital Stay: Admission: RE | Admit: 2015-11-12 | Payer: BC Managed Care – PPO | Source: Ambulatory Visit

## 2015-11-17 ENCOUNTER — Ambulatory Visit (HOSPITAL_BASED_OUTPATIENT_CLINIC_OR_DEPARTMENT_OTHER): Payer: BC Managed Care – PPO

## 2015-11-17 ENCOUNTER — Telehealth: Payer: Self-pay | Admitting: Oncology

## 2015-11-17 ENCOUNTER — Ambulatory Visit (HOSPITAL_BASED_OUTPATIENT_CLINIC_OR_DEPARTMENT_OTHER): Payer: BC Managed Care – PPO | Admitting: Oncology

## 2015-11-17 VITALS — BP 115/75 | HR 70 | Temp 98.2°F | Resp 18 | Ht 68.5 in | Wt 119.7 lb

## 2015-11-17 DIAGNOSIS — M25561 Pain in right knee: Secondary | ICD-10-CM

## 2015-11-17 DIAGNOSIS — M25562 Pain in left knee: Secondary | ICD-10-CM

## 2015-11-17 DIAGNOSIS — M1990 Unspecified osteoarthritis, unspecified site: Secondary | ICD-10-CM

## 2015-11-17 DIAGNOSIS — Z1509 Genetic susceptibility to other malignant neoplasm: Secondary | ICD-10-CM

## 2015-11-17 DIAGNOSIS — N6459 Other signs and symptoms in breast: Secondary | ICD-10-CM | POA: Diagnosis not present

## 2015-11-17 DIAGNOSIS — Z853 Personal history of malignant neoplasm of breast: Secondary | ICD-10-CM | POA: Diagnosis not present

## 2015-11-17 DIAGNOSIS — Z1501 Genetic susceptibility to malignant neoplasm of breast: Secondary | ICD-10-CM

## 2015-11-17 DIAGNOSIS — C50411 Malignant neoplasm of upper-outer quadrant of right female breast: Secondary | ICD-10-CM

## 2015-11-17 NOTE — Telephone Encounter (Signed)
appt made and avs printed. Pt sent back to lab today

## 2015-11-17 NOTE — Progress Notes (Signed)
Patient ID: Janet [name], female   DOB: [birth year], 60 y.o.   MRN: [id number] ID: [name] OB: [birth year]  MR#: [id number]  GLO#:[id number]  PCP: Osborne Casco, MD GYN:  Dr. Uvaldo Rising;  Dr. Donalynn Furlong SU: Osborn Coho; [name] OTHER MD: Arloa Koh, Juanito Doom;  Crissie Reese, MD  CHIEF COMPLAINTS:   Estrogen receptor positive breast cancer   CURRENT TREATMENT: Observation    BREAST CANCER HISTORY: From the original intake note: : "Janet [name]" herself noted a lump in her right breast on 03/17/2013, and immediately contacted her physician so that on 03/18/2013 bilateral diagnostic mammography and right breast ultrasonography at the breast Center showed a developing mass in the subareolar region of the right breast. There were no malignant type calcifications. This mass was palpable in the upper outer quadrant, and by ultrasound measured 2.6 cm. Ultrasonography of the right axilla showed a lymph node measuring 1.1 cm, with a thickened cortex. The left breast was unremarkable.  Biopsy of the right breast mass 08/26//2014 showed ([name]) and invasive ductal carcinoma, grade 3, triple negative, with an MIB-1 of 54%. Biopsy of the right axillary lymph node in question at the same time was negative.  Bilateral breast MRI 03/26/2013 measured the right subareolar mass at 3.8 cm. The previously biopsied benign right axillary lymph node was again noted. There were no other suspicious areas in the right breast or axilla and the left breast was unremarkable.  Her subsequent history is as detailed below  INTERVAL HISTORY: Janet [name] returns today for followup of her right breast cancer accompanied by her husband Janet [name]. In general everything is going well, except that she is having more trouble doing her morning runs. She now can run only 10 or 15 minutes because of pain in her knees. She wonders if strengthening her quads would be useful. 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 She doesn't go on to a gym and does not do much in terms of upper body exercises.  Since her last visit here she noted a change in her left breast area, towards the middle. Ultrasound showed no mass at all, and no distortion or shadowing. The irregularity was found to be in the implant itself and there was a similar finding on the right side. She was very reassured with these results.  REVIEW OF SYSTEMS: A detailed review of systems today was otherwise negative  PAST MEDICAL HISTORY: Past Medical History  Diagnosis Date  . Cancer     breast  . Breast cancer   . Allergy   . BRCA1 positive 2015  . Anemia, unspecified 07/09/2013    chemo  . Migraines     PAST SURGICAL HISTORY: Past Surgical History  Procedure Laterality Date  . Removal of cataracts    . Foot surgery  age 48    bone graft  . Portacath placement N/A 04/04/2013    Procedure: INSERTION PORT-A-CATH;  Surgeon: Haywood Lasso, MD;  Location: [facility];  Service: General;  Laterality: N/A;  . Mastectomy with axillary lymph node dissection Bilateral 09/30/2013    DR BOWERS  . Mastectomy with axillary lymph node dissection Bilateral 09/30/2013    Procedure: BILATERAL MASTECTOMIES RIGHT  AXILLARY LYMPH NODE BIOPSY;  Surgeon: Shann Medal, MD;  Location: [facility];  Service: General;  Laterality: Bilateral;  . Port-a-cath removal Left 09/30/2013    Procedure: REMOVAL PORT-A-CATH;  Surgeon: Shann Medal, MD;  Location: [place];  Service: General;  Laterality:  Left;  . Breast reconstruction with placement of tissue expander and flex hd (acellular hydrated dermis) Bilateral 09/30/2013    Procedure: BILATERAL TISSUE EXPANDERS ;  Surgeon: Crissie Reese, MD;  Location: [facility];  Service: Plastics;  Laterality: Bilateral;  . Laparoscopic bilateral salpingo oopherectomy N/A 01/29/2014    Procedure: LAPAROSCOPIC BILATERAL SALPINGO OOPHORECTOMY;  Surgeon: Terrance Mass, MD;  Location: [facility];  Service: Gynecology;   Laterality: N/A;  Dr. Phineas Real to assist.  Dr. Crissie Reese will be following with some reconstructive breast surgery.  His case will take approx 1 1/2 hours and his scheduler will post her part once we have a date. Thanks!  . Removal of tissue expander and placement of implant Bilateral 01/29/2014    Procedure: REMOVAL OF BILATERAL TISSUE EXPANDER AND PLACEMENT OF IMPLANT FOR BREAST RECONSTRUCTION;  Surgeon: Crissie Reese, MD;  Location: [facility];  Service: Plastics;  Laterality: Bilateral;    FAMILY HISTORY Family History  Problem Relation Age of Onset  . Breast cancer Maternal Grandmother 68  . Pancreatic cancer Father 41  . Heart Problems Maternal Uncle   . Ovarian cancer Paternal Aunt     dx in her 57s-60s  . Stomach cancer Other     father's maternal grandmother  . Breast cancer Cousin     Father's maternal cousin's daughter; Alto Denver  the patient's father died at the age of 93 with pancreatic cancer. (He was Dr. Barbee Shropshire). [name] is alive at age 54. [name], no sisters. [name]'s mother died from breast cancer at the age of 82. There is no history of ovarian cancer in the family.-- The patient has advised her children and the rest of her family regarding her being BRCA+  GYNECOLOGIC HISTORY:  Menarche age 18, first live birth age 13. She is GX P2. She stopped having periods approximately 2004. She did not take hormone replacement. She did take birth control pills remotely for 5-10 years, with no complications.  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 Her husband the [place]. AQuentin Cornwall "Janet [name]" Filkins, is a local judge. The patient's 2 children are Janet [name] who lives in [place] and works as a Public affairs consultant, and Janet [name], who  works in [place] and is an associate with a Interior and spatial designer group.   ADVANCED DIRECTIVES: In place.  Patient was also given information about advanced directives so that she may update her healthcare power of attorney. (09/03/2013)   HEALTH MAINTENANCE: (Updated February 2015) Social History  Substance Use Topics  . Smoking status: Never Smoker   . Smokeless tobacco: Never Used  . Alcohol Use: Yes     Comment: occasional     Colonoscopy: Not on file  PAP:  Oct 2014, Dr. Laurann Montana  Bone density: July 2013 at [facility], showed osteopenia (T-1.9)  Lipid panel: UTD, Dr. Laurann Montana   Allergies  Allergen Reactions  . Azithromycin Hives  . Penicillins Rash    Childhood reaction    Current Outpatient Prescriptions  Medication Sig Dispense Refill  . ibuprofen (ADVIL,MOTRIN) 800 MG tablet As needed for migraines 30 tablet 0  . Multiple Vitamin (MULTIVITAMIN) capsule Take 1 capsule by mouth daily.     No current facility-administered medications for this visit.  OBJECTIVE: Middle-aged white woman Who appears well  Filed Vitals:   11/17/15 1102  BP: 115/75  Pulse: 70  Temp: 98.2 F (36.8 C)  Resp: 18     Body mass index is 17.93 kg/(m^2).    ECOG FS:0 Filed Weights   11/17/15 1102  Weight: 119 lb 11.2 oz (54.296 kg)   Sclerae unicteric, EOMs intact Oropharynx clear, dentition in good repair No cervical or supraclavicular adenopathy Lungs no rales or rhonchi Heart regular rate and rhythm Abd soft, nontender, positive bowel sounds MSK no focal spinal tenderness, no upper extremity lymphedema Neuro: nonfocal, well oriented, appropriate affect Breasts: Status post bilateral mastectomies, with implants in place. There is no evidence of disease recurrence. Both axillae are benign.   LAB RESULTS:  CMP     Component Value Date/Time   NA 140 11/10/2015 1206   NA 141 01/27/2014 1445   K 4.1 11/10/2015 1206   K 4.7 01/27/2014 1445   CL 101 01/27/2014 1445    CO2 27 11/10/2015 1206   CO2 31 01/27/2014 1445   GLUCOSE 89 11/10/2015 1206   GLUCOSE 94 01/27/2014 1445   BUN 18.9 11/10/2015 1206   BUN 14 01/27/2014 1445   CREATININE 0.8 11/10/2015 1206   CREATININE 0.81 01/27/2014 1445   CALCIUM 10.6* 11/10/2015 1206   CALCIUM 11.5* 01/27/2014 1445   PROT 6.6 11/10/2015 1206   ALBUMIN 3.9 11/10/2015 1206   AST 40* 11/10/2015 1206   ALT 43 11/10/2015 1206   ALKPHOS 64 11/10/2015 1206   BILITOT 0.35 11/10/2015 1206   GFRNONAA 79* 01/27/2014 1445   GFRAA >90 01/27/2014 1445     Lab Results  Component Value Date   WBC 4.9 11/10/2015   NEUTROABS 3.2 11/10/2015   HGB 13.6 11/10/2015   HCT 40.8 11/10/2015   MCV 94.7 11/10/2015   PLT 192 11/10/2015      STUDIES: US Breast Ltd Uni Left Inc Axilla  11/06/2015  CLINICAL DATA:  60 year old female with palpable lump in the center of left breast. History of right breast cancer, bilateral mastectomies with implants and BRCA positive. EXAM: ULTRASOUND OF THE LEFT BREAST COMPARISON:  None. FINDINGS: On physical exam, a small nodular area at the center of the left breast is identified. Targeted ultrasound is performed, showing no evidence of solid or cystic mass, distortion or abnormal shadowing within the left breast, in the area of concern. Irregularity of the left implant at the site of palpable concern is noted likely representing the valve. The implant has a similar appearance to the right side. IMPRESSION: No sonographic breast abnormality in the area of palpable concern. Probable implant valve at the site of palpable concern in the left breast. RECOMMENDATION: Clinical followup as indicated. I have discussed the findings and recommendations with the patient. Results were also provided in writing at the conclusion of the visit. If applicable, a reminder letter will be sent to the patient regarding the next appointment. BI-RADS CATEGORY  1: Negative Electronically Signed   By: Margarette Canada M.D.   On:  11/06/2015 16:35     ASSESSMENT: 60 y.o. BRCA1 positive [place] woman   (1)  status post right breast biopsy 03/19/2013 for a clinical T2 N0, stage IIA invasive ductal breast cancer, grade 3, triple negative, with an MIB-1 of 54%.  (2) suspicious right axillary lymph node biopsy negative 03/19/2013  (3) received four cycles of dose dense doxorubicin/ cyclophosphamide between 04/17/2013 and 05/29/2013, at standard doses, with Neulasta support;  (4) on 06/12/2013 started  12 weekly doses of carboplatin/ paclitaxel.  One treatment was held in January secondary to chemotherapy-induced neutropenia, and that cycle was not "made up". Accordingly, patient received a total of 11 weekly doses, with final dose on 09/04/2013.  (5) s/p bilateral mastectomies with immediate expander placement and right-sided sentinel lymph node sampling 09/30/2013 for a residual ypT1a ypN0 invasive ductal carcinoma, grade not ascertained on this sample (read off original biopsy), repeat prognostic panel again progesterone and HER-2 negative, but now estrogen receptor positive at 46% with moderate staining intensity.  (a) considered anti-estrogen therapy but decided against it  (6) s/p bilateral salpingo-oophorectomy 01/29/2014 with benign pathology  PLAN: Janet [name] is now 2 years out from definitive surgery for her breast cancer with no evidence of disease recurrence. This is very favorable  The abnormality she was feeling in her left breast appears to be related to her implants and does not indicate disease recurrence. She will let me know if there is any further change in that area.  She is having knee pain which is keeping her from running. Currently she can only run about 15 minutes a day. This is very important to her. I am referring her to sports medicine for further advice. Today we did discuss some quad strengthening exercises which I think will be at least a start for her.  Her calcium has been mildly to  moderately increased last 3 determinations. I'm going ahead and obtain a parathyroid hormone level today. I will call her with those results.  Otherwise she will see me again in one year. She knows to contact me if any other problems develop before then.

## 2015-11-18 LAB — PTH, INTACT AND CALCIUM
CALCIUM: 10.9 mg/dL — AB (ref 8.7–10.2)
PTH, Intact: 62 pg/mL (ref 15–65)

## 2015-11-23 ENCOUNTER — Encounter: Payer: Self-pay | Admitting: Oncology

## 2016-05-17 ENCOUNTER — Other Ambulatory Visit: Payer: Self-pay | Admitting: Family Medicine

## 2016-05-17 ENCOUNTER — Other Ambulatory Visit (HOSPITAL_COMMUNITY)
Admission: RE | Admit: 2016-05-17 | Discharge: 2016-05-17 | Disposition: A | Discharge status: Home / Self Care | Payer: BC Managed Care – PPO | Source: Ambulatory Visit | Attending: Family Medicine | Admitting: Family Medicine

## 2016-05-17 DIAGNOSIS — Z124 Encounter for screening for malignant neoplasm of cervix: Secondary | ICD-10-CM | POA: Insufficient documentation

## 2016-05-19 LAB — CYTOLOGY - PAP: Diagnosis: NEGATIVE

## 2016-06-03 ENCOUNTER — Other Ambulatory Visit: Payer: Self-pay | Admitting: Family Medicine

## 2016-06-03 DIAGNOSIS — R7989 Other specified abnormal findings of blood chemistry: Secondary | ICD-10-CM

## 2016-06-03 DIAGNOSIS — R945 Abnormal results of liver function studies: Principal | ICD-10-CM

## 2016-06-10 ENCOUNTER — Other Ambulatory Visit: Payer: BC Managed Care – PPO

## 2016-06-14 ENCOUNTER — Ambulatory Visit
Admission: RE | Admit: 2016-06-14 | Discharge: 2016-06-14 | Disposition: A | Discharge status: Home / Self Care | Payer: BC Managed Care – PPO | Source: Ambulatory Visit | Attending: Family Medicine | Admitting: Family Medicine

## 2016-06-14 DIAGNOSIS — R945 Abnormal results of liver function studies: Principal | ICD-10-CM

## 2016-06-14 DIAGNOSIS — R7989 Other specified abnormal findings of blood chemistry: Secondary | ICD-10-CM

## 2016-07-21 ENCOUNTER — Other Ambulatory Visit: Payer: Self-pay | Admitting: Nurse Practitioner

## 2016-11-07 ENCOUNTER — Other Ambulatory Visit: Payer: Self-pay | Admitting: Adult Health

## 2016-11-07 DIAGNOSIS — C50411 Malignant neoplasm of upper-outer quadrant of right female breast: Secondary | ICD-10-CM

## 2016-11-08 ENCOUNTER — Other Ambulatory Visit (HOSPITAL_BASED_OUTPATIENT_CLINIC_OR_DEPARTMENT_OTHER): Payer: BC Managed Care – PPO

## 2016-11-08 DIAGNOSIS — C50411 Malignant neoplasm of upper-outer quadrant of right female breast: Secondary | ICD-10-CM

## 2016-11-08 DIAGNOSIS — Z853 Personal history of malignant neoplasm of breast: Secondary | ICD-10-CM

## 2016-11-08 LAB — COMPREHENSIVE METABOLIC PANEL
ALBUMIN: 4.2 g/dL (ref 3.5–5.0)
ALT: 39 U/L (ref 0–55)
AST: 36 U/L — AB (ref 5–34)
Alkaline Phosphatase: 71 U/L (ref 40–150)
Anion Gap: 8 mEq/L (ref 3–11)
BILIRUBIN TOTAL: 0.49 mg/dL (ref 0.20–1.20)
BUN: 16.8 mg/dL (ref 7.0–26.0)
CO2: 26 meq/L (ref 22–29)
CREATININE: 0.8 mg/dL (ref 0.6–1.1)
Calcium: 10.9 mg/dL — ABNORMAL HIGH (ref 8.4–10.4)
Chloride: 107 mEq/L (ref 98–109)
EGFR: 81 mL/min/{1.73_m2} — ABNORMAL LOW (ref >90)
GLUCOSE: 94 mg/dL (ref 70–140)
Potassium: 4.6 mEq/L (ref 3.5–5.1)
SODIUM: 141 meq/L (ref 136–145)
TOTAL PROTEIN: 6.8 g/dL (ref 6.4–8.3)

## 2016-11-08 LAB — CBC WITH DIFFERENTIAL/PLATELET
BASO%: 0.6 % (ref 0.0–2.0)
Basophils Absolute: 0 10*3/uL (ref 0.0–0.1)
EOS%: 2.1 % (ref 0.0–7.0)
Eosinophils Absolute: 0.1 10*3/uL (ref 0.0–0.5)
HCT: 41.5 % (ref 34.8–46.6)
HEMOGLOBIN: 14.3 g/dL (ref 11.6–15.9)
LYMPH%: 28.8 % (ref 14.0–49.7)
MCH: 32 pg (ref 25.1–34.0)
MCHC: 34.3 g/dL (ref 31.5–36.0)
MCV: 93 fL (ref 79.5–101.0)
MONO#: 0.3 10*3/uL (ref 0.1–0.9)
MONO%: 7.2 % (ref 0.0–14.0)
NEUT%: 61.3 % (ref 38.4–76.8)
NEUTROS ABS: 2.8 10*3/uL (ref 1.5–6.5)
Platelets: 208 10*3/uL (ref 145–400)
RBC: 4.46 10*6/uL (ref 3.70–5.45)
RDW: 13 % (ref 11.2–14.5)
WBC: 4.5 10*3/uL (ref 3.9–10.3)
lymph#: 1.3 10*3/uL (ref 0.9–3.3)

## 2016-11-15 ENCOUNTER — Ambulatory Visit (HOSPITAL_BASED_OUTPATIENT_CLINIC_OR_DEPARTMENT_OTHER): Payer: BC Managed Care – PPO

## 2016-11-15 ENCOUNTER — Ambulatory Visit (HOSPITAL_BASED_OUTPATIENT_CLINIC_OR_DEPARTMENT_OTHER): Payer: BC Managed Care – PPO | Admitting: Oncology

## 2016-11-15 VITALS — BP 124/61 | HR 65 | Temp 98.0°F | Resp 18 | Ht 68.5 in | Wt 122.8 lb

## 2016-11-15 DIAGNOSIS — C50411 Malignant neoplasm of upper-outer quadrant of right female breast: Secondary | ICD-10-CM

## 2016-11-15 DIAGNOSIS — Z171 Estrogen receptor negative status [ER-]: Secondary | ICD-10-CM

## 2016-11-15 DIAGNOSIS — Z853 Personal history of malignant neoplasm of breast: Secondary | ICD-10-CM

## 2016-11-15 LAB — CBC WITH DIFFERENTIAL/PLATELET
BASO%: 0.7 % (ref 0.0–2.0)
Basophils Absolute: 0 10*3/uL (ref 0.0–0.1)
EOS%: 1.8 % (ref 0.0–7.0)
Eosinophils Absolute: 0.1 10*3/uL (ref 0.0–0.5)
HCT: 43 % (ref 34.8–46.6)
HEMOGLOBIN: 14.4 g/dL (ref 11.6–15.9)
LYMPH%: 26.6 % (ref 14.0–49.7)
MCH: 31.5 pg (ref 25.1–34.0)
MCHC: 33.4 g/dL (ref 31.5–36.0)
MCV: 94.2 fL (ref 79.5–101.0)
MONO#: 0.3 10*3/uL (ref 0.1–0.9)
MONO%: 6.2 % (ref 0.0–14.0)
NEUT%: 64.7 % (ref 38.4–76.8)
NEUTROS ABS: 3.2 10*3/uL (ref 1.5–6.5)
Platelets: 210 10*3/uL (ref 145–400)
RBC: 4.56 10*6/uL (ref 3.70–5.45)
RDW: 13.4 % (ref 11.2–14.5)
WBC: 4.9 10*3/uL (ref 3.9–10.3)
lymph#: 1.3 10*3/uL (ref 0.9–3.3)

## 2016-11-15 LAB — COMPREHENSIVE METABOLIC PANEL
ALBUMIN: 4.1 g/dL (ref 3.5–5.0)
ALK PHOS: 75 U/L (ref 40–150)
ALT: 38 U/L (ref 0–55)
AST: 31 U/L (ref 5–34)
Anion Gap: 8 mEq/L (ref 3–11)
BILIRUBIN TOTAL: 0.6 mg/dL (ref 0.20–1.20)
BUN: 16.3 mg/dL (ref 7.0–26.0)
CALCIUM: 11.1 mg/dL — AB (ref 8.4–10.4)
CO2: 27 mEq/L (ref 22–29)
Chloride: 105 mEq/L (ref 98–109)
Creatinine: 0.8 mg/dL (ref 0.6–1.1)
EGFR: 79 mL/min/{1.73_m2} — AB (ref >90)
GLUCOSE: 92 mg/dL (ref 70–140)
Potassium: 4.2 mEq/L (ref 3.5–5.1)
SODIUM: 140 meq/L (ref 136–145)
TOTAL PROTEIN: 6.6 g/dL (ref 6.4–8.3)

## 2016-11-15 NOTE — Progress Notes (Signed)
Patient ID: Janet [name], female   DOB: 03-19-1956, 61 y.o.   MRN: [id number] ID: Janet [name] OB: [birth year]  MR#: [id number]  YSH#:[id number]  PCP: Janet Casco, MD GYN:  Dr. Uvaldo [name];  Dr. Donalynn [name] SU: Janet [name]; Janet [name] OTHER MD: Janet [name], Janet [name];  Janet Reese, MD  CHIEF COMPLAINTS:   Estrogen receptor positive breast cancer   CURRENT TREATMENT: Observation    BREAST CANCER HISTORY: From the original intake note: : "Janet [name]" herself noted a lump in her right breast on 03/17/2013, and immediately contacted her physician so that on 03/18/2013 bilateral diagnostic mammography and right breast ultrasonography at the breast Center showed a developing [name] in the subareolar region of the right breast. There were no malignant type calcifications. This [name] was palpable in the upper outer quadrant, and by ultrasound measured 2.6 cm. Ultrasonography of the right axilla showed a lymph node measuring 1.1 cm, with a thickened cortex. The left breast was unremarkable.  Biopsy of the right breast [name] 08/26//2014 showed ([name]) and invasive ductal carcinoma, grade 3, triple negative, with an MIB-1 of 54%. Biopsy of the right axillary lymph node in question at the same time was negative.  Bilateral breast MRI 03/26/2013 measured the right subareolar [name] at 3.8 cm. The previously biopsied benign right axillary lymph node was again noted. There were no other suspicious areas in the right breast or axilla and the left breast was unremarkable.  Her subsequent history is as detailed below  INTERVAL HISTORY: Janet [name] returns today for follow-up of her triple negative breast cancer. The interval history is generally unremarkable. She continues to teach full time. She exercises chiefly by running, most days. [name] she feels "great" and full of energy as usual.   REVIEW OF SYSTEMS: She has some thickening of her toenails and she has been using some  over-the-counter remedies without making much progress there. She doesn't feel her upper legs are as strong as they used to be. She has rare headaches, which are "close to migraines", but she does not like to take any medication for this. A detailed review of systems today was otherwise stable  PAST MEDICAL HISTORY: Past Medical History:  Diagnosis Date  . Allergy   . Anemia, unspecified 07/09/2013   chemo  . BRCA1 positive 2015  . Breast cancer   . Cancer    breast  . Migraines     PAST SURGICAL HISTORY: Past Surgical History:  Procedure Laterality Date  . BREAST RECONSTRUCTION WITH PLACEMENT OF TISSUE EXPANDER AND FLEX HD (ACELLULAR HYDRATED DERMIS) Bilateral 09/30/2013   Procedure: BILATERAL TISSUE EXPANDERS ;  Surgeon: Janet Reese, MD;  Location: [name];  Service: Plastics;  Laterality: Bilateral;  . FOOT SURGERY  age 89   bone graft  . LAPAROSCOPIC BILATERAL SALPINGO OOPHERECTOMY N/A 01/29/2014   Procedure: LAPAROSCOPIC BILATERAL SALPINGO OOPHORECTOMY;  Surgeon: Janet Mass, MD;  Location: [name];  Service: Gynecology;  Laterality: N/A;  Dr. Phineas [name] to assist.  Dr. Crissie [name] will be following with some reconstructive breast surgery.  His case will take approx 1 1/2 hours and his scheduler will post her part once we have a date. Thanks!  Marland Kitchen MASTECTOMY WITH AXILLARY LYMPH NODE DISSECTION Bilateral 09/30/2013   DR BOWERS  . MASTECTOMY WITH AXILLARY LYMPH NODE DISSECTION Bilateral 09/30/2013   Procedure: BILATERAL MASTECTOMIES RIGHT  AXILLARY LYMPH NODE BIOPSY;  Surgeon: Janet Medal, MD;  Location: [name];  Service: General;  Laterality: Bilateral;  . PORT-A-CATH REMOVAL  Left 09/30/2013   Procedure: REMOVAL PORT-A-CATH;  Surgeon: Janet Medal, MD;  Location: [facility];  Service: General;  Laterality: Left;  . PORTACATH PLACEMENT N/A 04/04/2013   Procedure: INSERTION PORT-A-CATH;  Surgeon: Haywood Lasso, MD;  [name];  Service: General;  Laterality: N/A;  . removal of  cataracts    . REMOVAL OF TISSUE EXPANDER AND PLACEMENT OF IMPLANT Bilateral 01/29/2014   Procedure: REMOVAL OF BILATERAL TISSUE EXPANDER AND PLACEMENT OF IMPLANT FOR BREAST RECONSTRUCTION;  Surgeon: Janet Reese, MD;  Location: [name];  Service: Plastics;  Laterality: Bilateral;    FAMILY HISTORY Family History  Problem Relation Age of Onset  . Breast cancer Maternal Grandmother 68  . Pancreatic cancer Father 72  . Heart Problems Maternal Uncle   . Ovarian cancer Paternal Aunt     dx in her 43s-60s  . Stomach cancer Other     father's maternal grandmother  . Breast cancer Cousin     Father's maternal cousin's daughter; Alto Denver  the patient's father died at the age of 4 with pancreatic cancer. (He was Janet [name]). [name] is alive at age 78. Janet [name] has one brother, no sisters. [name]'s mother died from breast cancer at the age of 81. There is no history of ovarian cancer in the family.-- The patient has advised her children and the rest of her family regarding her being BRCA+  GYNECOLOGIC HISTORY:  Menarche age 61, first live birth age 28. She is GX P2. She stopped having periods approximately 2004. She did not take hormone replacement. She did take birth control pills remotely for 5-10 years, with no complications.  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 Her husband the [name]. AQuentin Cornwall "Robby" [name], is a local judge. The patient's 2 children are Janet [name] who lives in [place] and works as a Public affairs consultant, and Janet [name], who works in [place] and is an associate with a Interior and spatial designer group.   ADVANCED DIRECTIVES: In place.  Patient was also given information about advanced directives so that she may update her healthcare  power of attorney. (09/03/2013)   HEALTH MAINTENANCE: (Updated February 2015) Social History  Substance Use Topics  . Smoking status: Never Smoker  . Smokeless tobacco: Never Used  . Alcohol use Yes     Comment: occasional     Colonoscopy: Not on file  PAP:  Oct 2014, Dr. Laurann Montana  Bone density: July 2013 at [facility], showed osteopenia (T-1.9)  Lipid panel: UTD, Dr. Laurann Montana   Allergies  Allergen Reactions  . Azithromycin Hives  . Penicillins Rash    Childhood reaction    Current Outpatient Prescriptions  Medication Sig Dispense Refill  . ibuprofen (ADVIL,MOTRIN) 800 MG tablet As needed for migraines 30 tablet 0  . Multiple Vitamin (MULTIVITAMIN) capsule Take 1 capsule by mouth daily.     No current facility-administered medications for this visit.     OBJECTIVE: Middle-aged white woman In no acute distress  Vitals:   11/15/16 1134  BP: 124/61  Pulse: 65  Resp: 18  Temp: 98 F (36.7 C)     Body [name] index is 18.4 kg/m.    ECOG FS:0 Filed Weights   11/15/16 1134  Weight: 122  lb 12.8 oz (55.7 kg)   Sclerae unicteric, pupils round and equal Oropharynx clear and moist No cervical or supraclavicular adenopathy Lungs no rales or rhonchi Heart regular rate and rhythm Abd soft, nontender, positive bowel sounds MSK no focal spinal tenderness, no upper extremity lymphedema Neuro: nonfocal, well oriented, appropriate affect Breasts: She is status post bilateral mastectomies with bilateral implants in place. There is no evidence of local recurrence. Both axillae are benign.   LAB RESULTS:  CMP     Component Value Date/Time   NA 141 11/08/2016 1157   K 4.6 11/08/2016 1157   CL 101 01/27/2014 1445   CO2 26 11/08/2016 1157   GLUCOSE 94 11/08/2016 1157   BUN 16.8 11/08/2016 1157   CREATININE 0.8 11/08/2016 1157   CALCIUM 10.9 (H) 11/08/2016 1157   PROT 6.8 11/08/2016 1157   ALBUMIN 4.2 11/08/2016 1157   AST 36 (H) 11/08/2016 1157   ALT 39 11/08/2016  1157   ALKPHOS 71 11/08/2016 1157   BILITOT 0.49 11/08/2016 1157   GFRNONAA 79 (L) 01/27/2014 1445   GFRAA >90 01/27/2014 1445     Lab Results  Component Value Date   WBC 4.5 11/08/2016   NEUTROABS 2.8 11/08/2016   HGB 14.3 11/08/2016   HCT 41.5 11/08/2016   MCV 93.0 11/08/2016   PLT 208 11/08/2016      STUDIES: No results found.   ASSESSMENT: 61 y.o. BRCA1 positive [place] woman   (1)  status post right breast biopsy 03/19/2013 for a clinical T2 N0, stage IIA invasive ductal breast cancer, grade 3, triple negative, with an MIB-1 of 54%.  (2) suspicious right axillary lymph node biopsy negative 03/19/2013  (3) received four cycles of dose dense doxorubicin/ cyclophosphamide between 04/17/2013 and 05/29/2013, at standard doses, with Neulasta support;  (4) on 06/12/2013 started 12 weekly doses of carboplatin/ paclitaxel.  One treatment was held in January secondary to chemotherapy-induced neutropenia, and that cycle was not "made up". Accordingly, patient received a total of 11 weekly doses, with final dose on 09/04/2013.  (5) s/p bilateral mastectomies with immediate expander placement and right-sided sentinel lymph node sampling 09/30/2013 for a residual ypT1a ypN0 invasive ductal carcinoma, grade not ascertained on this sample (read off original biopsy), repeat prognostic panel again progesterone and HER-2 negative, but now estrogen receptor positive at 46% with moderate staining intensity.  (a) considered anti-estrogen therapy but decided against it  (6) s/p bilateral salpingo-oophorectomy 01/29/2014 with benign pathology  PLAN: Janet [name] is  Now 3 years out from definitive surgery for her breast cancer with no evidence of disease recurrence. This is particularly favorable because estrogen receptor negative breast cancers if they are to occur tend to recur early.  I don't think the very minimal elevation in her AST is of consequence. She feels it may be due to some  migraine medication she took previously. We reviewed the ultrasound she had in November which showed no significant liver pathology.  I'm a bit more concern about her persistently elevated calcium. Were going to get a parathyroid hormone level today. If that is elevated she will be referred to surgery for consideration of parathyroidectomy.  I strongly urged her to see a dermatologist not just to look at her toenails but also to screen for melanoma. She will "think about it".  Otherwise she will see me again in one year. She knows to call for any other problems that may develop before her next visit here.

## 2016-11-16 LAB — PTH, INTACT AND CALCIUM
CALCIUM: 11.1 mg/dL — AB (ref 8.7–10.2)
PTH, Intact: 35 pg/mL (ref 15–65)

## 2016-11-21 LAB — PTH-RELATED PEPTIDE

## 2016-12-07 ENCOUNTER — Encounter: Payer: Self-pay | Admitting: Gynecology

## 2016-12-08 ENCOUNTER — Other Ambulatory Visit: Payer: Self-pay | Admitting: Oncology

## 2016-12-08 NOTE — Progress Notes (Unsigned)
Ms. calcium has run a little high. We checked a PTH, which was in the middle range at 35 range so that could be at least part of the cause. We also checked a PTH related peptide and that was undetectable.  I'm going to repeat a PTH and also obtain some vitamin D levels at the next lab draw.

## 2017-03-06 ENCOUNTER — Encounter (INDEPENDENT_AMBULATORY_CARE_PROVIDER_SITE_OTHER): Payer: Self-pay | Admitting: Orthopaedic Surgery

## 2017-03-06 ENCOUNTER — Ambulatory Visit (INDEPENDENT_AMBULATORY_CARE_PROVIDER_SITE_OTHER): Payer: Self-pay

## 2017-03-06 ENCOUNTER — Ambulatory Visit (INDEPENDENT_AMBULATORY_CARE_PROVIDER_SITE_OTHER): Payer: BC Managed Care – PPO | Admitting: Orthopaedic Surgery

## 2017-03-06 VITALS — BP 119/68 | HR 72 | Resp 14 | Ht 64.0 in | Wt 122.0 lb

## 2017-03-06 DIAGNOSIS — M25552 Pain in left hip: Secondary | ICD-10-CM

## 2017-03-06 MED ORDER — LIDOCAINE HCL (PF) 1 % IJ SOLN
3.0000 mL | INTRAMUSCULAR | Status: AC | PRN
  Administered 2017-03-06: 3 mL

## 2017-03-06 MED ORDER — METHYLPREDNISOLONE ACETATE 40 MG/ML IJ SUSP
40.0000 mg | INTRAMUSCULAR | Status: AC | PRN
  Administered 2017-03-06: 40 mg via INTRA_ARTICULAR

## 2017-03-06 NOTE — Progress Notes (Signed)
Office Visit Note   Patient: Janet [name]           Date of Birth: 04-13-1956           MRN: [id number] Visit Date: 03/06/2017              Requested by: Kelton Pillar, MD [address] [place], [place] [address] PCP: Kelton Pillar, MD   Assessment & Plan: Visit Diagnoses:  1. Pain in left hip   Probable deep muscle bruise in the area of the right sacroiliac jo  Plan: Cortisone injection over area of greatest tenderness near the sacroiliac joint and buttock. Hopefully this will provide enough relief that she can be on her feet. I would suggest that she not run for several days and allow this to "work". Plan to see her back if no improvement over the next several days. Wouldn't be surprised if she did develop some ecchymosis  Follow-Up Instructions: No Follow-up on file.   Orders:  Orders Placed This Encounter  Procedures  . XR Pelvis 1-2 Views   No orders of the defined types were placed in this encounter.     Procedures: Large Joint Inj Date/Time: 03/06/2017 12:50 PM Performed by: Garald Balding Authorized by: Garald Balding   Indications:  Pain Location:  Sacroiliac Site:  R sacroiliac joint Needle Size:  27 G Ultrasound Guidance: No   Fluoroscopic Guidance: No   Medications:  3 mL lidocaine (PF) 1 %; 40 mg methylPREDNISolone acetate 40 MG/ML     Clinical Data: No additional findings.   Subjective: Chief Complaint  Patient presents with  . Left Hip - Pain, Injury    Pt was working in her garden last Saturday [name] weeds and she felt a sharp pain with Left sided hip pain when she hit a piece of wood.  HPI  Janet [name] is working in her garden 48 hours ago when she tripped and fell backwards landing on an elevated area in her garden. She had direct injury in the area of the right sacroiliac joint and buttock. This been no bruising but she's had a considerable amount of pain when she tries to ambulate. 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 She's not had any numbness or tingling. She denies any specific back pain. There is no groin discomfort  Review of Systems  Constitutional: Negative for chills, fatigue and fever.  Eyes: Negative for itching.  Respiratory: Negative for chest tightness and shortness of breath.   Cardiovascular: Negative for chest pain, palpitations and leg swelling.  Gastrointestinal: Negative for blood in stool, constipation and diarrhea.  Musculoskeletal: Negative for back pain, joint swelling, neck pain and neck stiffness.  Neurological: Negative for dizziness, weakness, numbness and headaches.  Hematological: Does not bruise/bleed easily.  Psychiatric/Behavioral: Negative for sleep disturbance. The patient is not nervous/anxious.      Objective: Vital Signs: BP 119/68   Pulse 72   Resp 14   Ht '5\' 4"'  (1.626 m)   Wt 122 lb (55.3 kg)   BMI 20.94 kg/m   Physical Exam  Ortho Exam no areas of bruising in the area of the right superior buttock pain near the sacroiliac joint. No pain in the midline of the lumbar spine. Painless range of motion of her hip. Pain is more prominent with weightbearing and ambulation than to actual palpation neurovascular exam intact. Skin intact.   Specialty Comments:  No specialty comments available.  Imaging: Xr Pelvis 1-2 Views  Result Date: 03/06/2017 AP the pelvis was obtained without evidence of fracture. There is some bony demineralization. Some degenerative change at L5-S1 which is poorly visualized. No other bony abnormalities. Sacroiliac joints intact    PMFS History: Patient Active Problem List   Diagnosis Date Noted  . Hypercalcemia 11/15/2016  . Thrombocytopenia ([facility]) 08/27/2013  . BRCA1 positive   . Migraines 05/14/2013  . Malignant neoplasm of upper-outer quadrant of right breast in female, estrogen receptor negative ([facility]) 03/21/2013   Past Medical History:  Diagnosis Date  . Allergy   . Anemia,  unspecified 07/09/2013   chemo  . BRCA1 positive 2015  . Breast cancer ([facility])   . Cancer ([facility])    breast  . Migraines     Family History  Problem Relation Age of Onset  . Breast cancer Maternal Grandmother 68  . Pancreatic cancer Father 68  . Heart Problems Maternal Uncle   . Ovarian cancer Paternal Aunt        dx in her 29s-60s  . Stomach cancer Other        father's maternal grandmother  . Breast cancer Cousin        Father's maternal cousin's daughter; [place]    Past Surgical History:  Procedure Laterality Date  . BREAST RECONSTRUCTION WITH PLACEMENT OF TISSUE EXPANDER AND FLEX HD (ACELLULAR HYDRATED DERMIS) Bilateral 09/30/2013   Procedure: BILATERAL TISSUE EXPANDERS ;  Surgeon: Crissie Reese, MD;  Location: [facility];  Service: Plastics;  Laterality: Bilateral;  . FOOT SURGERY  age 89   bone graft  . LAPAROSCOPIC BILATERAL SALPINGO OOPHERECTOMY N/A 01/29/2014   Procedure: LAPAROSCOPIC BILATERAL SALPINGO OOPHORECTOMY;  Surgeon: Terrance Mass, MD;  Location: [facility];  Service: Gynecology;  Laterality: N/A;  Dr. Phineas Real to assist.  Dr. Crissie Reese will be following with some reconstructive breast surgery.  His case will take approx 1 1/2 hours and his scheduler will post her part once we have a date. Thanks!  Marland Kitchen MASTECTOMY WITH AXILLARY LYMPH NODE DISSECTION Bilateral 09/30/2013   DR BOWERS  . MASTECTOMY WITH AXILLARY LYMPH NODE DISSECTION Bilateral 09/30/2013   Procedure: BILATERAL MASTECTOMIES RIGHT  AXILLARY LYMPH NODE BIOPSY;  Surgeon: Shann Medal, MD;  Location: [facility];  Service: General;  Laterality: Bilateral;  . PORT-A-CATH REMOVAL Left 09/30/2013   Procedure: REMOVAL PORT-A-CATH;  Surgeon: Shann Medal, MD;  Location: [facility];  Service: General;  Laterality: Left;  . PORTACATH PLACEMENT N/A 04/04/2013   Procedure: INSERTION PORT-A-CATH;  Surgeon: Haywood Lasso, MD;  Location: [facility];  Service: General;  Laterality: N/A;  . removal of cataracts    . REMOVAL OF TISSUE  EXPANDER AND PLACEMENT OF IMPLANT Bilateral 01/29/2014   Procedure: REMOVAL OF BILATERAL TISSUE EXPANDER AND PLACEMENT OF IMPLANT FOR BREAST RECONSTRUCTION;  Surgeon: Crissie Reese, MD;  Location: [facility];  Service: Plastics;  Laterality: Bilateral;   Social History   Occupational History  .  [employer]   Social History Main Topics  . Smoking status: Never Smoker  . Smokeless tobacco: Never Used  . Alcohol use Yes     Comment: occasional  . Drug use: No  . Sexual activity: No

## 2017-11-07 ENCOUNTER — Inpatient Hospital Stay: Payer: BC Managed Care – PPO | Attending: Oncology

## 2017-11-07 DIAGNOSIS — E213 Hyperparathyroidism, unspecified: Secondary | ICD-10-CM | POA: Diagnosis not present

## 2017-11-07 DIAGNOSIS — R739 Hyperglycemia, unspecified: Secondary | ICD-10-CM | POA: Diagnosis not present

## 2017-11-07 DIAGNOSIS — Z9013 Acquired absence of bilateral breasts and nipples: Secondary | ICD-10-CM | POA: Diagnosis not present

## 2017-11-07 DIAGNOSIS — Z853 Personal history of malignant neoplasm of breast: Secondary | ICD-10-CM | POA: Diagnosis present

## 2017-11-07 DIAGNOSIS — Z803 Family history of malignant neoplasm of breast: Secondary | ICD-10-CM | POA: Diagnosis not present

## 2017-11-07 DIAGNOSIS — C50411 Malignant neoplasm of upper-outer quadrant of right female breast: Secondary | ICD-10-CM

## 2017-11-07 DIAGNOSIS — Z1501 Genetic susceptibility to malignant neoplasm of breast: Secondary | ICD-10-CM | POA: Diagnosis not present

## 2017-11-07 DIAGNOSIS — Z9221 Personal history of antineoplastic chemotherapy: Secondary | ICD-10-CM | POA: Insufficient documentation

## 2017-11-07 DIAGNOSIS — Z800 Family history of malignant neoplasm of digestive organs: Secondary | ICD-10-CM | POA: Diagnosis not present

## 2017-11-07 LAB — CBC WITH DIFFERENTIAL/PLATELET
BASOS ABS: 0 10*3/uL (ref 0.0–0.1)
Basophils Relative: 1 %
EOS PCT: 1 %
Eosinophils Absolute: 0 10*3/uL (ref 0.0–0.5)
HEMATOCRIT: 43.4 % (ref 34.8–46.6)
Hemoglobin: 14.6 g/dL (ref 11.6–15.9)
Lymphocytes Relative: 27 %
Lymphs Abs: 1 10*3/uL (ref 0.9–3.3)
MCH: 31.5 pg (ref 25.1–34.0)
MCHC: 33.6 g/dL (ref 31.5–36.0)
MCV: 93.8 fL (ref 79.5–101.0)
Monocytes Absolute: 0.2 10*3/uL (ref 0.1–0.9)
Monocytes Relative: 7 %
NEUTROS ABS: 2.3 10*3/uL (ref 1.5–6.5)
Neutrophils Relative %: 64 %
PLATELETS: 215 10*3/uL (ref 145–400)
RBC: 4.63 MIL/uL (ref 3.70–5.45)
RDW: 12.6 % (ref 11.2–14.5)
WBC: 3.5 10*3/uL — AB (ref 3.9–10.3)

## 2017-11-07 LAB — COMPREHENSIVE METABOLIC PANEL
ALBUMIN: 4.2 g/dL (ref 3.5–5.0)
ALK PHOS: 72 U/L (ref 40–150)
ALT: 32 U/L (ref 0–55)
AST: 25 U/L (ref 5–34)
Anion gap: 8 (ref 3–11)
BUN: 19 mg/dL (ref 7–26)
CHLORIDE: 107 mmol/L (ref 98–109)
CO2: 25 mmol/L (ref 22–29)
Calcium: 10.9 mg/dL — ABNORMAL HIGH (ref 8.4–10.4)
Creatinine, Ser: 0.87 mg/dL (ref 0.60–1.10)
GFR calc Af Amer: >60 mL/min (ref >60)
GFR calc non Af Amer: >60 mL/min (ref >60)
Glucose, Bld: 83 mg/dL (ref 70–140)
Potassium: 4.1 mmol/L (ref 3.5–5.1)
SODIUM: 140 mmol/L (ref 136–145)
TOTAL PROTEIN: 6.8 g/dL (ref 6.4–8.3)
Total Bilirubin: 0.6 mg/dL (ref 0.2–1.2)

## 2017-11-08 LAB — PTH, INTACT AND CALCIUM
Calcium, Total (PTH): 10.8 mg/dL — ABNORMAL HIGH (ref 8.7–10.3)
PTH: 61 pg/mL (ref 15–65)

## 2017-11-08 LAB — VITAMIN D 25 HYDROXY (VIT D DEFICIENCY, FRACTURES): Vit D, 25-Hydroxy: 40.7 ng/mL (ref 30.0–100.0)

## 2017-11-08 LAB — CALCITRIOL (1,25 DI-OH VIT D): VIT D 1 25 DIHYDROXY: 61.2 pg/mL (ref 19.9–79.3)

## 2017-11-13 NOTE — Progress Notes (Addendum)
Patient ID: Janet [name], female   DOB: December 27, 1955, 62 y.o.   MRN: [id number] ID: [name] OB: 31-Mar-1956  MR#: [id number]  YQM#:[id number]  PCP: Kelton Pillar, MD GYN:  Dr. Uvaldo Rising;  Dr. Donalynn Furlong SU: Osborn Coho; [name] OTHER MD: Arloa Koh, Juanito Doom;  Crissie Reese, MD  CHIEF COMPLAINTS:   Estrogen receptor positive breast cancer   CURRENT TREATMENT: Observation   BREAST CANCER HISTORY: From the original intake note: "Janet [name]" herself noted a lump in her right breast on 03/17/2013, and immediately contacted her physician so that on 03/18/2013 bilateral diagnostic mammography and right breast ultrasonography at the breast Center showed a developing mass in the subareolar region of the right breast. There were no malignant type calcifications. This mass was palpable in the upper outer quadrant, and by ultrasound measured 2.6 cm. Ultrasonography of the right axilla showed a lymph node measuring 1.1 cm, with a thickened cortex. The left breast was unremarkable.  Biopsy of the right breast mass 08/26//2014 showed ([name]) and invasive ductal carcinoma, grade 3, triple negative, with an MIB-1 of 54%. Biopsy of the right axillary lymph node in question at the same time was negative.  Bilateral breast MRI 03/26/2013 measured the right subareolar mass at 3.8 cm. The previously biopsied benign right axillary lymph node was again noted. There were no other suspicious areas in the right breast or axilla and the left breast was unremarkable.  Her subsequent history is as detailed below  INTERVAL HISTORY: Janet [name] returns today for follow-up of her triple negative breast cancer. She is doing well overall. Her PCP is [name] and she has had follow up test twice regarding her calcium levels.  These are discussed further below.   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 She notes that she has started taking vitamin D She reports that her family is doing well overall. She denies unusual headaches, visual changes, nausea, vomiting, or dizziness. There has been no unusual cough, phlegm production, or pleurisy. This been no change in bowel or bladder habits. She denies unexplained fatigue or unexplained weight loss, bleeding, rash, or fever. A detailed review of systems was otherwise stable.     PAST MEDICAL HISTORY: Past Medical History:  Diagnosis Date  . Allergy   . Anemia, unspecified 07/09/2013   chemo  . BRCA1 positive 2015  . Breast cancer ([facility])   . Cancer ([facility])    breast  . Migraines     PAST SURGICAL HISTORY: Past Surgical History:  Procedure Laterality Date  . BREAST RECONSTRUCTION WITH PLACEMENT OF TISSUE EXPANDER AND FLEX HD (ACELLULAR HYDRATED DERMIS) Bilateral 09/30/2013   Procedure: BILATERAL TISSUE EXPANDERS ;  Surgeon: Crissie Reese, MD;  Location: [facility];  Service: Plastics;  Laterality: Bilateral;  . FOOT SURGERY  age 66   bone graft  . LAPAROSCOPIC BILATERAL SALPINGO OOPHERECTOMY N/A 01/29/2014   Procedure: LAPAROSCOPIC BILATERAL SALPINGO OOPHORECTOMY;  Surgeon: Terrance Mass, MD;  Location: [facility];  Service: Gynecology;  Laterality: N/A;  Dr. Phineas Real to assist.  Dr. Crissie Reese will be following with some reconstructive breast surgery.  His case will take approx 1 1/2 hours and his scheduler will post her part once we have a date. Thanks!  Marland Kitchen MASTECTOMY WITH AXILLARY LYMPH NODE DISSECTION Bilateral 09/30/2013   DR BOWERS  . MASTECTOMY WITH AXILLARY LYMPH  NODE DISSECTION Bilateral 09/30/2013   Procedure: BILATERAL MASTECTOMIES RIGHT  AXILLARY LYMPH NODE BIOPSY;  Surgeon: Shann Medal, MD;  Location: [facility];  Service: General;  Laterality: Bilateral;  . PORT-A-CATH REMOVAL Left 09/30/2013   Procedure: REMOVAL PORT-A-CATH;  Surgeon: Shann Medal, MD;  Location: [facility];  Service:  General;  Laterality: Left;  . PORTACATH PLACEMENT N/A 04/04/2013   Procedure: INSERTION PORT-A-CATH;  Surgeon: Haywood Lasso, MD;  [facility];  Service: General;  Laterality: N/A;  . removal of cataracts    . REMOVAL OF TISSUE EXPANDER AND PLACEMENT OF IMPLANT Bilateral 01/29/2014   Procedure: REMOVAL OF BILATERAL TISSUE EXPANDER AND PLACEMENT OF IMPLANT FOR BREAST RECONSTRUCTION;  Surgeon: Crissie Reese, MD;  Location: [facility];  Service: Plastics;  Laterality: Bilateral;    FAMILY HISTORY Family History  Problem Relation Age of Onset  . Breast cancer Maternal Grandmother 68  . Pancreatic cancer Father 35  . Heart Problems Maternal Uncle   . Ovarian cancer Paternal Aunt        dx in her 59s-60s  . Stomach cancer Other        father's maternal grandmother  . Breast cancer Cousin        Father's maternal cousin's daughter; Janet [name]  the patient's father died at the age of 50 with pancreatic cancer. (He was Dr. Barbee Shropshire). [name] is alive at age 30. Janet [name] has one brother, no sisters. [name]'s mother died from breast cancer at the age of 22. There is no history of ovarian cancer in the family.-- The patient has advised her children and the rest of her family regarding her being BRCA+  GYNECOLOGIC HISTORY:  Menarche age 44, first live birth age 8. She is GX P2. She stopped having periods approximately 2004. She did not take hormone replacement. She did take birth control pills remotely for 5-10 years, with no complications.  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 Her husband the [place]. Janet [name], is a local judge. The patient's 2 children are Janet [name] who lives in [place] and works as a Public affairs consultant, and Janet [name], who  works in [place] and is an associate with a Interior and spatial designer group.   ADVANCED DIRECTIVES: In place.  Patient was also given information about advanced directives so that she may update her healthcare power of attorney. (09/03/2013)   HEALTH MAINTENANCE: (Updated February 2015) Social History   Tobacco Use  . Smoking status: Never Smoker  . Smokeless tobacco: Never Used  Substance Use Topics  . Alcohol use: Yes    Comment: occasional  . Drug use: No     Colonoscopy: Not on file  PAP:  Oct 2014, Dr. Laurann Montana  Bone density: July 2013 at [facility], showed osteopenia (T-1.9)  Lipid panel: UTD, Dr. Laurann Montana   Allergies  Allergen Reactions  . Azithromycin Hives  . Penicillins Rash    Childhood reaction    Current Outpatient Medications  Medication Sig Dispense Refill  . ibuprofen (ADVIL,MOTRIN) 800 MG tablet As needed for migraines 30 tablet 0  . Multiple Vitamin (MULTIVITAMIN) capsule Take 1 capsule by mouth daily.     No current facility-administered medications for this visit.     OBJECTIVE:  Middle-aged white woman who appears well  Vitals:   11/14/17 1140  BP: 118/77  Pulse: 62  Resp: 18  Temp: 97.8 F (36.6 C)  SpO2: 100%     Body mass index is 20.99 kg/m.    ECOG FS:0 Filed Weights   11/14/17 1140  Weight: 122 lb 4.8 oz (55.5 kg)   Sclerae unicteric, EOMs intact No cervical or supraclavicular adenopathy Lungs no rales or rhonchi Heart regular rate and rhythm Abd soft, nontender, positive bowel sounds MSK no focal spinal tenderness, no upper extremity lymphedema Neuro: nonfocal, well oriented, appropriate affect Breasts: Status post bilateral mastectomies.  No evidence of chest wall recurrence.  Both axillae are benign.  LAB RESULTS:  CMP     Component Value Date/Time   NA 140 11/07/2017 1138   NA 140 11/15/2016 1222   K 4.1 11/07/2017 1138   K 4.2 11/15/2016 1222   CL 107 11/07/2017 1138   CO2 25 11/07/2017 1138   CO2 27  11/15/2016 1222   GLUCOSE 83 11/07/2017 1138   GLUCOSE 92 11/15/2016 1222   BUN 19 11/07/2017 1138   BUN 16.3 11/15/2016 1222   CREATININE 0.87 11/07/2017 1138   CREATININE 0.8 11/15/2016 1222   CALCIUM 10.8 (H) 11/07/2017 1138   CALCIUM 10.9 (H) 11/07/2017 1138   CALCIUM 11.1 (H) 11/15/2016 1222   PROT 6.8 11/07/2017 1138   PROT 6.6 11/15/2016 1222   ALBUMIN 4.2 11/07/2017 1138   ALBUMIN 4.1 11/15/2016 1222   AST 25 11/07/2017 1138   AST 31 11/15/2016 1222   ALT 32 11/07/2017 1138   ALT 38 11/15/2016 1222   ALKPHOS 72 11/07/2017 1138   ALKPHOS 75 11/15/2016 1222   BILITOT 0.6 11/07/2017 1138   BILITOT 0.60 11/15/2016 1222   GFRNONAA >60 11/07/2017 1138   GFRAA >60 11/07/2017 1138     Lab Results  Component Value Date   WBC 3.5 (L) 11/07/2017   NEUTROABS 2.3 11/07/2017   HGB 14.6 11/07/2017   HCT 43.4 11/07/2017   MCV 93.8 11/07/2017   PLT 215 11/07/2017      STUDIES: No results found.   ASSESSMENT: 62 y.o. BRCA1 positive [place] woman   (1)  status post right breast biopsy 03/19/2013 for a clinical T2 N0, stage IIA invasive ductal breast cancer, grade 3, triple negative, with an MIB-1 of 54%.  (2) suspicious right axillary lymph node biopsy negative 03/19/2013  (3) received four cycles of dose dense doxorubicin/ cyclophosphamide between 04/17/2013 and 05/29/2013, at standard doses, with Neulasta support;  (4) on 06/12/2013 started 12 weekly doses of carboplatin/ paclitaxel.  One treatment was held in January secondary to chemotherapy-induced neutropenia, and that cycle was not "made up". Accordingly, patient received a total of 11 weekly doses, with final dose on 09/04/2013.  (5) s/p bilateral mastectomies with immediate expander placement and right-sided sentinel lymph node sampling 09/30/2013 for a residual ypT1a ypN0 invasive ductal carcinoma, grade not ascertained on this sample (read off original biopsy), repeat prognostic panel again progesterone and  HER-2 negative, but now estrogen receptor positive at 46% with moderate staining intensity.  (a) considered anti-estrogen therapy but decided against it  (6) s/p bilateral salpingo-oophorectomy 01/29/2014 with benign pathology  PLAN: Janet [name] is now 4 years out from definitive surgery for her triple negative breast cancer, with no evidence of disease recurrence.  This is very favorable.  She carries a BRCA mutation.  She does have a family history of pancreatic cancer.  Today we discussed the fact that we  do not have a good way to screen for this.  There is a study at [facility] that does offer some screening and we have started doing MRCP's on a yearly basis in patients who wish to participate in such a protocol.  The problem aside from cost of course is false positives and then there is the fact that we do not have evidence that this is effective.  At this point she is not interested in proceeding with an MRCP.  Given the fact that triple negative breast cancer if it recurs to recur early I think her risk of recurrence at this point is very low and fully that was reassuring for her  We also discussed her calcium, which has been persistently but stably elevated.  I am not an expert in this area, but from what I read the fact that the PTH is on the higher side rather than low indicates mild hyperparathyroidism.  I have a letter out to Dr. Hillary Bow to see if he feels an appointment with him would be appropriate  She will see me again in 1 year.  She knows to call for any problems that may develop before then.  Tyreanna Bisesi, Virgie Dad, MD  11/14/17 12:04 PM Medical Oncology and Hematology [facility] [address] [place], [place] [address] Tel. [phone]    Fax. [phone]    This document serves as a record of services personally performed by Lurline Del, MD. It was created on his behalf by Steva Colder, a trained medical scribe. The creation of this record is based on the  scribe's personal observations and the provider's statements to them.   I have reviewed the above documentation for accuracy and completeness, and I agree with the above.  ADDENDUM: I discussed her hyperglycemia with Dr. Harlow Asa and he suggested we get a 24-hour urine to rule out familial hypocalciuric hypercalcemia.  I discussed this with the lab we are facilitating this.  I also called Janet [name] and explained the work-up.  She understands she very likely has hyperparathyroidism and very likely will need surgery for this but we are obtaining this test first just to.the eyes and cross the tease.

## 2017-11-14 ENCOUNTER — Inpatient Hospital Stay (HOSPITAL_BASED_OUTPATIENT_CLINIC_OR_DEPARTMENT_OTHER): Payer: BC Managed Care – PPO | Admitting: Oncology

## 2017-11-14 ENCOUNTER — Telehealth: Payer: Self-pay | Admitting: Oncology

## 2017-11-14 VITALS — BP 118/77 | HR 62 | Temp 97.8°F | Resp 18 | Ht 64.0 in | Wt 122.3 lb

## 2017-11-14 DIAGNOSIS — Z9013 Acquired absence of bilateral breasts and nipples: Secondary | ICD-10-CM | POA: Diagnosis not present

## 2017-11-14 DIAGNOSIS — E213 Hyperparathyroidism, unspecified: Secondary | ICD-10-CM

## 2017-11-14 DIAGNOSIS — R739 Hyperglycemia, unspecified: Secondary | ICD-10-CM | POA: Diagnosis not present

## 2017-11-14 DIAGNOSIS — Z9221 Personal history of antineoplastic chemotherapy: Secondary | ICD-10-CM | POA: Diagnosis not present

## 2017-11-14 DIAGNOSIS — Z800 Family history of malignant neoplasm of digestive organs: Secondary | ICD-10-CM | POA: Diagnosis not present

## 2017-11-14 DIAGNOSIS — Z1501 Genetic susceptibility to malignant neoplasm of breast: Secondary | ICD-10-CM | POA: Diagnosis not present

## 2017-11-14 DIAGNOSIS — Z803 Family history of malignant neoplasm of breast: Secondary | ICD-10-CM

## 2017-11-14 DIAGNOSIS — Z1509 Genetic susceptibility to other malignant neoplasm: Secondary | ICD-10-CM

## 2017-11-14 DIAGNOSIS — C50411 Malignant neoplasm of upper-outer quadrant of right female breast: Secondary | ICD-10-CM

## 2017-11-14 DIAGNOSIS — Z853 Personal history of malignant neoplasm of breast: Secondary | ICD-10-CM | POA: Diagnosis not present

## 2017-11-14 DIAGNOSIS — Z171 Estrogen receptor negative status [ER-]: Principal | ICD-10-CM

## 2017-11-14 NOTE — Telephone Encounter (Signed)
Gave avs and calendar patient requested 10/09/2018

## 2017-11-16 ENCOUNTER — Other Ambulatory Visit: Payer: Self-pay | Admitting: Oncology

## 2017-11-16 NOTE — Addendum Note (Signed)
Addended by: Chauncey Cruel on: 11/16/2017 07:45 AM   Modules accepted: Orders

## 2017-11-20 ENCOUNTER — Other Ambulatory Visit: Payer: Self-pay

## 2017-11-20 ENCOUNTER — Inpatient Hospital Stay: Payer: BC Managed Care – PPO

## 2017-11-20 DIAGNOSIS — Z1509 Genetic susceptibility to other malignant neoplasm: Secondary | ICD-10-CM

## 2017-11-20 DIAGNOSIS — Z171 Estrogen receptor negative status [ER-]: Principal | ICD-10-CM

## 2017-11-20 DIAGNOSIS — Z1501 Genetic susceptibility to malignant neoplasm of breast: Secondary | ICD-10-CM

## 2017-11-20 DIAGNOSIS — C50411 Malignant neoplasm of upper-outer quadrant of right female breast: Secondary | ICD-10-CM

## 2017-11-20 DIAGNOSIS — Z853 Personal history of malignant neoplasm of breast: Secondary | ICD-10-CM | POA: Diagnosis not present

## 2017-11-20 LAB — CREATININE CLEARANCE, URINE, 24 HOUR
CREAT CLEAR: 107 mL/min (ref 75–115)
CREATININE 24H UR: 1283 mg/d (ref 600–1800)
CREATININE, URINE: 61.08 mg/dL
Collection Interval-CRCL: 24 hours
URINE TOTAL VOLUME-CRCL: 2100 mL

## 2017-11-20 LAB — CREATININE (CANCER CENTER ONLY): Creatinine: 0.83 mg/dL (ref 0.60–1.10)

## 2017-11-20 LAB — PROTEIN, URINE, 24 HOUR
Collection Interval-UPROT: 24 hours
Protein, 24H Urine: <6 mg/d — ABNORMAL LOW (ref 50–100)
Urine Total Volume-UPROT: 2100 mL

## 2017-11-21 LAB — CALCIUM, URINE, 24 HOUR
CALCIUM UR: 11.4 mg/dL
Calcium, 24 hour urine: 239.4 mg/24 hr (ref 100.0–300.0)
Total Volume: 2100

## 2017-12-11 ENCOUNTER — Encounter: Payer: Self-pay | Admitting: Oncology

## 2017-12-11 ENCOUNTER — Other Ambulatory Visit: Payer: Self-pay | Admitting: Oncology

## 2017-12-22 ENCOUNTER — Other Ambulatory Visit: Payer: Self-pay | Admitting: *Deleted

## 2017-12-22 ENCOUNTER — Telehealth: Payer: Self-pay | Admitting: *Deleted

## 2017-12-22 NOTE — Telephone Encounter (Signed)
This RN received VM from pt's husband stating per their inquiry with insurance benefits - Dr Harlow Asa is in network- request for referral to be placed per concern with hyperparathyroidism.  This RN obtained order per MD and referral placed in Epic.  This RN then called [facility] and was transferred to Clarise Cruz- new patient scheduler- which went to VM. Detailed message left per MD referral.  Of note per Triage Nurse at [facility] - their office does not work off of [employer].  This RN called and spoke with the patient's husband per above interaction.

## 2018-01-18 ENCOUNTER — Other Ambulatory Visit (HOSPITAL_COMMUNITY): Payer: Self-pay | Admitting: Surgery

## 2018-02-01 ENCOUNTER — Ambulatory Visit (HOSPITAL_COMMUNITY)
Admission: RE | Admit: 2018-02-01 | Discharge: 2018-02-01 | Disposition: A | Discharge status: Home / Self Care | Payer: BC Managed Care – PPO | Source: Ambulatory Visit | Attending: Surgery | Admitting: Surgery

## 2018-02-01 ENCOUNTER — Encounter (HOSPITAL_COMMUNITY)
Admission: RE | Admit: 2018-02-01 | Discharge: 2018-02-01 | Disposition: A | Discharge status: Home / Self Care | Payer: BC Managed Care – PPO | Source: Ambulatory Visit | Attending: Surgery | Admitting: Surgery

## 2018-02-01 DIAGNOSIS — E041 Nontoxic single thyroid nodule: Secondary | ICD-10-CM | POA: Insufficient documentation

## 2018-02-01 MED ORDER — TECHNETIUM TC 99M SESTAMIBI GENERIC - CARDIOLITE
27.3000 | Freq: Once | INTRAVENOUS | Status: AC
  Administered 2018-02-01: 27.3 via INTRAVENOUS

## 2018-02-08 ENCOUNTER — Ambulatory Visit: Payer: Self-pay | Admitting: Surgery

## 2018-02-09 ENCOUNTER — Other Ambulatory Visit: Payer: Self-pay

## 2018-02-09 ENCOUNTER — Encounter (HOSPITAL_COMMUNITY): Payer: Self-pay | Admitting: *Deleted

## 2018-02-14 ENCOUNTER — Encounter (HOSPITAL_COMMUNITY): Payer: Self-pay | Admitting: Surgery

## 2018-02-14 DIAGNOSIS — E210 Primary hyperparathyroidism: Secondary | ICD-10-CM | POA: Diagnosis present

## 2018-02-14 NOTE — H&P (Signed)
General Surgery [name]  [name] DOB: Oct 30, 1955 Married / Language: English / Race: White Female   History of Present Illness   The patient is a 62 year old female who presents with primary hyperparathyroidism.  CHIEF COMPLAINT: hypercalcemia, rule out primary hyperparathyroidism  Patient is referred by [name] for evaluation and management of hypercalcemia and suspected primary hyperparathyroidism. Patient's oncologist is [name]. Patient has a history of breast cancer treated by my partner, Dr. [name]. Patient was noted on routine laboratory studies to have a mildly elevated calcium level. Most recent level is 10.8. Intact PTH level was in the upper normal range at 61. 25-hydroxy vitamin D level was normal at 40.7. 24-hour urine collection for calcium was in the upper normal range at 239. She does take vitamin D supplementation. She has had a bone density scan in the past showing osteopenia. She has no recent fractures. She denies nephrolithiasis. She denies fatigue. Patient has not had any imaging studies. There is no family history of endocrine neoplasms.   Past Surgical History Breast Biopsy  Right. Foot Surgery  Left. Mastectomy  Bilateral.  Diagnostic Studies History Pap Smear  1-5 years ago  Allergies Penicillins  Allergies Reconciled   Medication History CycloSPORINE (0.05% Emulsion, Ophthalmic) Active. Multi-Vitamin (Oral) Active. Vitamin D (Ergocalciferol) (50000UNIT Capsule, Oral) Active. Medications Reconciled  Social History Alcohol use  Occasional alcohol use. Caffeine use  Coffee, Tea. No drug use  Tobacco use  Never smoker.  Family History Malignant Neoplasm Of Pancreas  Father.  Pregnancy / Birth History Age at menarche  46 years. Gravida  3 Maternal age  74-35  Other Problems Breast Cancer  Other disease, cancer, significant illness   Review of Systems   General Not Present- Appetite Loss, Chills, Fatigue, Fever, Night Sweats, Weight Gain and Weight Loss. Skin Present- Dryness. Not Present- Change in Wart/Mole, Hives, Jaundice, New Lesions, Non-Healing Wounds, Rash and Ulcer. Respiratory Not Present- Bloody sputum, Chronic Cough, Difficulty Breathing, Snoring and Wheezing. Gastrointestinal Not Present- Abdominal Pain, Bloating, Bloody Stool, Change in Bowel Habits, Chronic diarrhea, Constipation, Difficulty Swallowing, Excessive gas, Gets full quickly at meals, Hemorrhoids, Indigestion, Nausea, Rectal Pain and Vomiting. Musculoskeletal Present- Joint Pain. Not Present- Back Pain, Joint Stiffness, Muscle Pain, Muscle Weakness and Swelling of Extremities. Endocrine Not Present- Cold Intolerance, Excessive Hunger, Hair Changes, Heat Intolerance, Hot flashes and New Diabetes. Hematology Not Present- Blood Thinners, Easy Bruising, Excessive bleeding, Gland problems, HIV and Persistent Infections.  Vitals Weight: 123.8 lb Height: 67in Body Surface Area: 1.65 m Body Mass Index: 19.39 kg/m  Temp.: 98.41F  Pulse: 88 (Regular)  BP: 116/74 (Sitting, Left Arm, Standard)  Physical Exam  See vital signs recorded above  GENERAL APPEARANCE Development: normal Nutritional status: normal Gross deformities: none  SKIN Rash, lesions, ulcers: none Induration, erythema: none Nodules: none palpable  EYES Conjunctiva and lids: normal Pupils: equal and reactive Iris: normal bilaterally  EARS, NOSE, MOUTH, THROAT External ears: no lesion or deformity External nose: no lesion or deformity Hearing: grossly normal Lips: no lesion or deformity Dentition: normal for age Oral mucosa: moist  NECK Symmetric: yes Trachea: midline Thyroid: no palpable nodules in the thyroid bed  CHEST Respiratory effort: normal Retraction or accessory muscle use: no Breath sounds: normal bilaterally Rales, rhonchi, wheeze:  none  CARDIOVASCULAR Auscultation: regular rhythm, normal rate Murmurs: none Pulses: carotid and radial pulse 2+ palpable Lower extremity edema: none Lower extremity varicosities: none  MUSCULOSKELETAL Station and gait: normal  Digits and nails: no clubbing or cyanosis Muscle strength: grossly normal all extremities Range of motion: grossly normal all extremities Deformity: none  LYMPHATIC Cervical: none palpable Supraclavicular: none palpable  PSYCHIATRIC Oriented to person, place, and time: yes Mood and affect: normal for situation Judgment and insight: appropriate for situation    Assessment & Plan  HYPERCALCEMIA (E83.52)  Pt Education - Pamphlet Given - The Parathyroid Surgery Book: discussed with patient and provided information.  OSTEOPENIA (M85.80)  Follow Up - Call [facility] office after tests / studies doneto discuss further plans  Patient presents for evaluation of hypercalcemia and suspected primary hyperparathyroidism. Patient is provided with written literature on parathyroid surgery to review at home.  Patient has a mildly elevated calcium level. Intact PTH level is in the upper range of normal which is inappropriate. It should be suppressed and the lower normal range.  Based on these findings I believe the patient warrants imaging. I have recommended proceeding with an ultrasound examination of the neck as well as a nuclear medicine parathyroid scan. We discussed the studies today in the office. We will make arrangements for the studies in the near future. I will contact the patient with the results. If the results are not definitive, we will consider proceeding with a 4D CT scan with parathyroid protocol.  Patient will undergo the above studies. We will contact her with the results when they are available.  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  Plan minimally invasive surgical approach for  parathyroidectomy.  The risks and benefits of the procedure have been discussed at length with the patient.  The patient understands the proposed procedure, potential alternative treatments, and the course of recovery to be expected.  All of the patient's questions have been answered at this time.  The patient wishes to proceed with surgery.  Armandina Gemma, [facility] Office: [phone]

## 2018-02-16 ENCOUNTER — Ambulatory Visit (HOSPITAL_COMMUNITY): Payer: BC Managed Care – PPO | Admitting: Certified Registered Nurse Anesthetist

## 2018-02-16 ENCOUNTER — Ambulatory Visit (HOSPITAL_COMMUNITY)
Admission: RE | Admit: 2018-02-16 | Discharge: 2018-02-16 | Disposition: A | Discharge status: Home / Self Care | Payer: BC Managed Care – PPO | Source: Ambulatory Visit | Attending: Surgery | Admitting: Surgery

## 2018-02-16 ENCOUNTER — Encounter (HOSPITAL_COMMUNITY)
Admission: RE | Disposition: A | Discharge status: Home / Self Care | Payer: Self-pay | Source: Ambulatory Visit | Attending: Surgery

## 2018-02-16 ENCOUNTER — Encounter (HOSPITAL_COMMUNITY): Payer: Self-pay

## 2018-02-16 DIAGNOSIS — Z853 Personal history of malignant neoplasm of breast: Secondary | ICD-10-CM | POA: Diagnosis not present

## 2018-02-16 DIAGNOSIS — Z880 Allergy status to penicillin: Secondary | ICD-10-CM | POA: Insufficient documentation

## 2018-02-16 DIAGNOSIS — E210 Primary hyperparathyroidism: Secondary | ICD-10-CM | POA: Diagnosis not present

## 2018-02-16 DIAGNOSIS — Z800 Family history of malignant neoplasm of digestive organs: Secondary | ICD-10-CM | POA: Insufficient documentation

## 2018-02-16 DIAGNOSIS — Z9013 Acquired absence of bilateral breasts and nipples: Secondary | ICD-10-CM | POA: Insufficient documentation

## 2018-02-16 DIAGNOSIS — Z79899 Other long term (current) drug therapy: Secondary | ICD-10-CM | POA: Diagnosis not present

## 2018-02-16 DIAGNOSIS — Z01818 Encounter for other preprocedural examination: Secondary | ICD-10-CM

## 2018-02-16 HISTORY — PX: PARATHYROIDECTOMY: SHX19

## 2018-02-16 SURGERY — PARATHYROIDECTOMY
Anesthesia: General | Site: Neck

## 2018-02-16 MED ORDER — TRAMADOL HCL 50 MG PO TABS: 50.0000 mg | ORAL_TABLET | Freq: Four times a day (QID) | ORAL | 0 refills | Status: AC | PRN

## 2018-02-16 MED ORDER — BUPIVACAINE HCL 0.25 % IJ SOLN
INTRAMUSCULAR | Status: DC | PRN
  Administered 2018-02-16: 9 mL

## 2018-02-16 MED ORDER — BUPIVACAINE HCL (PF) 0.25 % IJ SOLN
INTRAMUSCULAR | Status: AC
  Filled 2018-02-16: qty 30

## 2018-02-16 MED ORDER — FENTANYL CITRATE (PF) 250 MCG/5ML IJ SOLN
INTRAMUSCULAR | Status: AC
Start: 2018-02-16 — End: ?
  Filled 2018-02-16: qty 5

## 2018-02-16 MED ORDER — MIDAZOLAM HCL 5 MG/5ML IJ SOLN
INTRAMUSCULAR | Status: DC | PRN
  Administered 2018-02-16 (×2): 1 mg via INTRAVENOUS

## 2018-02-16 MED ORDER — FENTANYL CITRATE (PF) 100 MCG/2ML IJ SOLN: 25.0000 ug | INTRAMUSCULAR | Status: DC | PRN

## 2018-02-16 MED ORDER — ONDANSETRON HCL 4 MG/2ML IJ SOLN: 4.0000 mg | Freq: Once | INTRAMUSCULAR | Status: DC | PRN

## 2018-02-16 MED ORDER — SUCCINYLCHOLINE CHLORIDE 200 MG/10ML IV SOSY
PREFILLED_SYRINGE | INTRAVENOUS | Status: AC
  Filled 2018-02-16: qty 10

## 2018-02-16 MED ORDER — ROCURONIUM BROMIDE 10 MG/ML (PF) SYRINGE
PREFILLED_SYRINGE | INTRAVENOUS | Status: AC
  Filled 2018-02-16: qty 10

## 2018-02-16 MED ORDER — DEXAMETHASONE SODIUM PHOSPHATE 10 MG/ML IJ SOLN
INTRAMUSCULAR | Status: AC
  Filled 2018-02-16: qty 1

## 2018-02-16 MED ORDER — CHLORHEXIDINE GLUCONATE CLOTH 2 % EX PADS: 6.0000 | MEDICATED_PAD | Freq: Once | CUTANEOUS | Status: DC

## 2018-02-16 MED ORDER — LIDOCAINE 2% (20 MG/ML) 5 ML SYRINGE
INTRAMUSCULAR | Status: AC
  Filled 2018-02-16: qty 5

## 2018-02-16 MED ORDER — PROPOFOL 10 MG/ML IV BOLUS
INTRAVENOUS | Status: DC | PRN
  Administered 2018-02-16: 120 mg via INTRAVENOUS

## 2018-02-16 MED ORDER — 0.9 % SODIUM CHLORIDE (POUR BTL) OPTIME
TOPICAL | Status: DC | PRN
  Administered 2018-02-16: 1000 mL

## 2018-02-16 MED ORDER — DEXAMETHASONE SODIUM PHOSPHATE 10 MG/ML IJ SOLN
INTRAMUSCULAR | Status: DC | PRN
  Administered 2018-02-16: 10 mg via INTRAVENOUS

## 2018-02-16 MED ORDER — ONDANSETRON HCL 4 MG/2ML IJ SOLN
INTRAMUSCULAR | Status: AC
  Filled 2018-02-16: qty 2

## 2018-02-16 MED ORDER — SUGAMMADEX SODIUM 200 MG/2ML IV SOLN
INTRAVENOUS | Status: DC | PRN
  Administered 2018-02-16: 200 mg via INTRAVENOUS

## 2018-02-16 MED ORDER — SUCCINYLCHOLINE CHLORIDE 200 MG/10ML IV SOSY
PREFILLED_SYRINGE | INTRAVENOUS | Status: DC | PRN
  Administered 2018-02-16: 80 mg via INTRAVENOUS

## 2018-02-16 MED ORDER — LACTATED RINGERS IV SOLN
INTRAVENOUS | Status: DC
  Administered 2018-02-16: 1000 mL via INTRAVENOUS
  Administered 2018-02-16: 10:00:00 via INTRAVENOUS

## 2018-02-16 MED ORDER — ROCURONIUM BROMIDE 50 MG/5ML IV SOSY
PREFILLED_SYRINGE | INTRAVENOUS | Status: DC | PRN
  Administered 2018-02-16: 30 mg via INTRAVENOUS

## 2018-02-16 MED ORDER — SUGAMMADEX SODIUM 200 MG/2ML IV SOLN
INTRAVENOUS | Status: AC
  Filled 2018-02-16: qty 2

## 2018-02-16 MED ORDER — ONDANSETRON HCL 4 MG/2ML IJ SOLN
INTRAMUSCULAR | Status: DC | PRN
  Administered 2018-02-16: 4 mg via INTRAVENOUS

## 2018-02-16 MED ORDER — MIDAZOLAM HCL 2 MG/2ML IJ SOLN
INTRAMUSCULAR | Status: AC
  Filled 2018-02-16: qty 2

## 2018-02-16 MED ORDER — PROPOFOL 10 MG/ML IV BOLUS
INTRAVENOUS | Status: AC
  Filled 2018-02-16: qty 20

## 2018-02-16 MED ORDER — LIDOCAINE 2% (20 MG/ML) 5 ML SYRINGE
INTRAMUSCULAR | Status: DC | PRN
  Administered 2018-02-16: 50 mg via INTRAVENOUS

## 2018-02-16 MED ORDER — OXYCODONE HCL 5 MG/5ML PO SOLN
5.0000 mg | Freq: Once | ORAL | Status: DC | PRN
  Filled 2018-02-16: qty 5

## 2018-02-16 MED ORDER — CIPROFLOXACIN IN D5W 400 MG/200ML IV SOLN
400.0000 mg | INTRAVENOUS | Status: AC
  Administered 2018-02-16: 400 mg via INTRAVENOUS
  Filled 2018-02-16: qty 200

## 2018-02-16 MED ORDER — FENTANYL CITRATE (PF) 100 MCG/2ML IJ SOLN
INTRAMUSCULAR | Status: DC | PRN
  Administered 2018-02-16 (×2): 50 ug via INTRAVENOUS

## 2018-02-16 MED ORDER — OXYCODONE HCL 5 MG PO TABS: 5.0000 mg | ORAL_TABLET | Freq: Once | ORAL | Status: DC | PRN

## 2018-02-16 MED ORDER — LIDOCAINE HCL 4 % MT SOLN
OROMUCOSAL | Status: DC | PRN
  Administered 2018-02-16: 2 mL via TOPICAL

## 2018-02-16 SURGICAL SUPPLY — 38 items
ATTRACTOMAT 16X20 MAGNETIC DRP (DRAPES) ×3 IMPLANT
BLADE HEX COATED 2.75 (ELECTRODE) ×3 IMPLANT
BLADE SURG 15 STRL LF DISP TIS (BLADE) ×1 IMPLANT
BLADE SURG 15 STRL SS (BLADE) ×2
CHLORAPREP W/TINT 26ML (MISCELLANEOUS) ×6 IMPLANT
CLIP VESOCCLUDE MED 6/CT (CLIP) ×6 IMPLANT
CLIP VESOCCLUDE SM WIDE 6/CT (CLIP) ×6 IMPLANT
CLOSURE WOUND 1/2 X4 (GAUZE/BANDAGES/DRESSINGS) ×1
DERMABOND ADVANCED (GAUZE/BANDAGES/DRESSINGS)
DERMABOND ADVANCED .7 DNX12 (GAUZE/BANDAGES/DRESSINGS) IMPLANT
DRAPE LAPAROTOMY T 98X78 PEDS (DRAPES) ×3 IMPLANT
ELECT PENCIL ROCKER SW 15FT (MISCELLANEOUS) ×3 IMPLANT
ELECT REM PT RETURN 15FT ADLT (MISCELLANEOUS) ×3 IMPLANT
GAUZE 4X4 16PLY RFD (DISPOSABLE) ×3 IMPLANT
GAUZE SPONGE 4X4 12PLY STRL (GAUZE/BANDAGES/DRESSINGS) ×3 IMPLANT
GLOVE SURG ORTHO 8.0 STRL STRW (GLOVE) ×3 IMPLANT
GOWN STRL REUS W/TWL XL LVL3 (GOWN DISPOSABLE) ×9 IMPLANT
HEMOSTAT SURGICEL 2X4 FIBR (HEMOSTASIS) IMPLANT
ILLUMINATOR WAVEGUIDE N/F (MISCELLANEOUS) IMPLANT
KIT BASIN OR (CUSTOM PROCEDURE TRAY) ×3 IMPLANT
LIGHT WAVEGUIDE WIDE FLAT (MISCELLANEOUS) IMPLANT
NEEDLE HYPO 25X1 1.5 SAFETY (NEEDLE) ×3 IMPLANT
PACK BASIC VI WITH GOWN DISP (CUSTOM PROCEDURE TRAY) ×3 IMPLANT
POWDER SURGICEL 3.0 GRAM (HEMOSTASIS) IMPLANT
STAPLER VISISTAT 35W (STAPLE) ×3 IMPLANT
STRIP CLOSURE SKIN 1/2X4 (GAUZE/BANDAGES/DRESSINGS) ×2 IMPLANT
SUT MNCRL AB 4-0 PS2 18 (SUTURE) ×3 IMPLANT
SUT SILK 2 0 (SUTURE)
SUT SILK 2-0 18XBRD TIE 12 (SUTURE) IMPLANT
SUT SILK 3 0 (SUTURE)
SUT SILK 3-0 18XBRD TIE 12 (SUTURE) IMPLANT
SUT VIC AB 3-0 SH 18 (SUTURE) ×3 IMPLANT
SYR BULB IRRIGATION 50ML (SYRINGE) ×3 IMPLANT
SYR CONTROL 10ML LL (SYRINGE) ×3 IMPLANT
TAPE CLOTH SURG 4X10 WHT LF (GAUZE/BANDAGES/DRESSINGS) ×3 IMPLANT
TOWEL OR 17X26 10 PK STRL BLUE (TOWEL DISPOSABLE) ×3 IMPLANT
TOWEL OR NON WOVEN STRL DISP B (DISPOSABLE) ×3 IMPLANT
YANKAUER SUCT BULB TIP 10FT TU (MISCELLANEOUS) ×3 IMPLANT

## 2018-02-16 NOTE — Anesthesia Procedure Notes (Signed)
Procedure Name: Intubation Date/Time: 02/16/2018 9:17 AM Performed by: Maxwell Caul, CRNA Pre-anesthesia Checklist: Patient identified, Emergency Drugs available, Suction available and Patient being monitored Patient Re-evaluated:Patient Re-evaluated prior to induction Oxygen Delivery Method: Circle system utilized Preoxygenation: Pre-oxygenation with 100% oxygen Induction Type: IV induction Ventilation: Mask ventilation without difficulty Laryngoscope Size: Mac and 4 Grade View: Grade I Tube type: Oral Tube size: 7.5 mm Number of attempts: 1 Airway Equipment and Method: Stylet and LTA kit utilized Placement Confirmation: ETT inserted through vocal cords under direct vision,  positive ETCO2 and breath sounds checked- equal and bilateral Secured at: 21 cm Tube secured with: Tape Dental Injury: Teeth and Oropharynx as per pre-operative assessment

## 2018-02-16 NOTE — Anesthesia Preprocedure Evaluation (Addendum)

## 2018-02-16 NOTE — Transfer of Care (Signed)
Immediate Anesthesia Transfer of Care Note  Patient: Janet [name]  Procedure(s) Performed: PARATHYROIDECTOMY (N/A Neck)  Patient Location: PACU  Anesthesia Type:General  Level of Consciousness: awake, alert  and oriented  Airway & Oxygen Therapy: Patient Spontanous Breathing and Patient connected to face mask oxygen  Post-op Assessment: Report given to RN and Post -op Vital signs reviewed and stable  Post vital signs: Reviewed and stable  Last Vitals:  Vitals Value Taken Time  BP 130/75 02/16/2018 10:23 AM  Temp    Pulse 75 02/16/2018 10:23 AM  Resp 13 02/16/2018 10:23 AM  SpO2 100 % 02/16/2018 10:23 AM  Vitals shown include unvalidated device data.  Last Pain:  Vitals:   02/16/18 0726  TempSrc:   PainSc: 0-No pain      Patients Stated Pain Goal: 4 (63/01/60 1093)  Complications: No apparent anesthesia complications

## 2018-02-16 NOTE — Interval H&P Note (Signed)
History and Physical Interval Note:  02/16/2018 8:53 AM  Janet [name]  has presented today for surgery, with the diagnosis of primary hyperparathyroidism.  The various methods of treatment have been discussed with the patient and family. After consideration of risks, benefits and other options for treatment, the patient has consented to    Procedure(s): PARATHYROIDECTOMY (N/A) as a surgical intervention .    The patient's history has been reviewed, patient examined, no change in status, stable for surgery.  I have reviewed the patient's chart and labs.  Questions were answered to the patient's satisfaction.    Armandina Gemma, [facility] Office: [place]

## 2018-02-16 NOTE — Anesthesia Postprocedure Evaluation (Signed)
Anesthesia Post Note  Patient: Janet [name]  Procedure(s) Performed: PARATHYROIDECTOMY (N/A Neck)     Patient location during evaluation: PACU Anesthesia Type: General Level of consciousness: awake and alert Pain management: pain level controlled Vital Signs Assessment: post-procedure vital signs reviewed and stable Respiratory status: spontaneous breathing, nonlabored ventilation, respiratory function stable and patient connected to nasal cannula oxygen Cardiovascular status: blood pressure returned to baseline and stable Postop Assessment: no apparent nausea or vomiting Anesthetic complications: no    Last Vitals:  Vitals:   02/16/18 1124 02/16/18 1214  BP: (!) 152/83 (!) 148/79  Pulse: (!) 55 60  Resp: 14 16  Temp: (!) 36.4 C (!) 36.3 C  SpO2: 100% 100%    Last Pain:  Vitals:   02/16/18 1214  TempSrc:   PainSc: 0-No pain                 Janet [name]

## 2018-02-16 NOTE — Discharge Instructions (Signed)
[facility]  THYROID & PARATHYROID SURGERY:  POST-OP INSTRUCTIONS  Always review your discharge instruction sheet from the facility where your surgery was performed.  A prescription for pain medication may be given to you upon discharge.  Take your pain medication as prescribed.  If narcotic pain medicine is not needed, then you may take acetaminophen (Tylenol) or ibuprofen (Advil) as needed.  Take your usually prescribed medications unless otherwise directed.  If you need a refill on your pain medication, please contact our office during regular business hours.  Prescriptions cannot be processed by our office after 5 pm or on weekends.  Start with a light diet upon arrival home, such as soup and crackers or toast.  Be sure to drink plenty of fluids daily.  Resume your normal diet the day after surgery.  Most patients will experience some swelling and bruising on the chest and neck area.  Ice packs will help.  Swelling and bruising can take several days to resolve.   It is common to experience some constipation after surgery.  Increasing fluid intake and taking a stool softener (Colace) will usually help or prevent this problem.  A mild laxative (Milk of Magnesia or Miralax) should be taken according to package directions if there has been no bowel movement after 48 hours.  You have steri-strips and a gauze dressing over your incision.  You may remove the gauze bandage on the second day after surgery, and you may shower at that time.  Leave your steri-strips (small skin tapes) in place directly over the incision.  These strips should remain on the skin for 5-7 days and then be removed.  You may get them wet in the shower and pat them dry.  You may resume regular (light) daily activities beginning the next day (such as daily self-care, walking, climbing stairs) gradually increasing activities as tolerated.  You may have sexual intercourse when it is comfortable.  Refrain from  any heavy lifting or straining until approved by your doctor.  You may drive when you no longer are taking prescription pain medication, you can comfortably wear a seatbelt, and you can safely maneuver your car and apply brakes.  You should see your doctor in the office for a follow-up appointment approximately three weeks after your surgery.  Make sure that you call for this appointment within a day or two after you arrive home to insure a convenient appointment time.  WHEN TO CALL YOUR DOCTOR: -- Fever greater than 101.5 -- Inability to urinate -- Nausea and/or vomiting - persistent -- Extreme swelling or bruising -- Continued bleeding from incision -- Increased pain, redness, or drainage from the incision -- Difficulty swallowing or breathing -- Muscle cramping or spasms -- Numbness or tingling in hands or around lips  The clinic staff is available to answer your questions during regular business hours.  Please don't hesitate to call and ask to speak to one of the nurses if you have concerns.  Siona Coulston, MD [facility] Office: [phone] 

## 2018-02-16 NOTE — Op Note (Signed)
OPERATIVE REPORT - PARATHYROIDECTOMY  Preoperative diagnosis: Primary hyperparathyroidism  Postop diagnosis: Same  Procedure: Left minimally invasive parathyroidectomy  Surgeon:  Armandina Gemma, MD  Anesthesia: General endotracheal  Estimated blood loss: Minimal  Preparation: ChloraPrep  Indications: Patient is referred by [name] for evaluation and management of hypercalcemia and suspected primary hyperparathyroidism. Patient's oncologist is [name]. Patient has a history of breast cancer treated by my partner, Dr. [name]. Patient was noted on routine laboratory studies to have a mildly elevated calcium level. Most recent level is 10.8. Intact PTH level was in the upper normal range at 61. 25-hydroxy vitamin D level was normal at 40.7. 24-hour urine collection for calcium was in the upper normal range at 239. She does take vitamin D supplementation. She has had a bone density scan in the past showing osteopenia. She has no recent fractures. She denies nephrolithiasis. She denies fatigue. Nuclear med parathyroid scan localized a left inferior adenoma.  She now comes for minimally invasive surgery.  Procedure: The patient was prepared in the pre-operative holding area. The patient was brought to the operating room and placed in a supine position on the operating room table. Following administration of general anesthesia, the patient was positioned and then prepped and draped in the usual strict aseptic fashion. After ascertaining that an adequate level of anesthesia been achieved, a neck incision was made with a #15 blade. Dissection was carried through subcutaneous tissues and platysma. Hemostasis was obtained with the electrocautery. Skin flaps were developed circumferentially and a Weitlander retractor was placed for exposure.  Strap muscles were incised in the midline. Strap muscles were reflected lateralley exposing the thyroid lobe. With gentle blunt  dissection the thyroid lobe was mobilized.  Dissection was carried through adipose tissue and an enlarged parathyroid gland was identified located posteriorly adjacent to the esophagus.  This was likely a superior gland which had descended in the posterior plane. It was gently mobilized. Vascular structures were divided between small and medium ligaclips. Care was taken to avoid the recurrent laryngeal nerve and the esophagus. The parathyroid gland was completely excised. It was submitted to pathology where frozen section confirmed parathyroid tissue consistent with adenoma.  Neck was irrigated with warm saline and good hemostasis was noted. Fibrillar was placed in the operative field. Strap muscles were reapproximated in the midline with interrupted 3-0 Vicryl sutures. Platysma was closed with interrupted 3-0 Vicryl sutures. Skin was closed with a running 4-0 Monocryl subcuticular suture. Marcaine was infiltrated circumferentially. Wound was washed and dried and Steristrips were applied. Patient was awakened from anesthesia and brought to the recovery room. The patient tolerated the procedure well.   Armandina Gemma, MD [facility] Office: [phone]

## 2018-02-17 ENCOUNTER — Encounter (HOSPITAL_COMMUNITY): Payer: Self-pay | Admitting: Surgery

## 2018-07-12 ENCOUNTER — Encounter: Payer: Self-pay | Admitting: Oncology

## 2018-10-02 ENCOUNTER — Inpatient Hospital Stay: Payer: BC Managed Care – PPO | Attending: Oncology

## 2018-10-02 DIAGNOSIS — C50411 Malignant neoplasm of upper-outer quadrant of right female breast: Secondary | ICD-10-CM

## 2018-10-02 DIAGNOSIS — Z9079 Acquired absence of other genital organ(s): Secondary | ICD-10-CM | POA: Diagnosis not present

## 2018-10-02 DIAGNOSIS — Z1509 Genetic susceptibility to other malignant neoplasm: Secondary | ICD-10-CM

## 2018-10-02 DIAGNOSIS — Z800 Family history of malignant neoplasm of digestive organs: Secondary | ICD-10-CM | POA: Insufficient documentation

## 2018-10-02 DIAGNOSIS — Z9013 Acquired absence of bilateral breasts and nipples: Secondary | ICD-10-CM | POA: Diagnosis not present

## 2018-10-02 DIAGNOSIS — Z171 Estrogen receptor negative status [ER-]: Secondary | ICD-10-CM

## 2018-10-02 DIAGNOSIS — Z853 Personal history of malignant neoplasm of breast: Secondary | ICD-10-CM | POA: Diagnosis present

## 2018-10-02 DIAGNOSIS — Z90722 Acquired absence of ovaries, bilateral: Secondary | ICD-10-CM | POA: Insufficient documentation

## 2018-10-02 DIAGNOSIS — Z1501 Genetic susceptibility to malignant neoplasm of breast: Secondary | ICD-10-CM | POA: Diagnosis not present

## 2018-10-02 DIAGNOSIS — Z803 Family history of malignant neoplasm of breast: Secondary | ICD-10-CM | POA: Diagnosis not present

## 2018-10-02 LAB — CBC WITH DIFFERENTIAL/PLATELET
ABS IMMATURE GRANULOCYTES: 0.01 10*3/uL (ref 0.00–0.07)
Basophils Absolute: 0 10*3/uL (ref 0.0–0.1)
Basophils Relative: 1 %
Eosinophils Absolute: 0.1 10*3/uL (ref 0.0–0.5)
Eosinophils Relative: 2 %
HEMATOCRIT: 42.4 % (ref 36.0–46.0)
Hemoglobin: 13.9 g/dL (ref 12.0–15.0)
Immature Granulocytes: 0 %
LYMPHS ABS: 1.7 10*3/uL (ref 0.7–4.0)
Lymphocytes Relative: 30 %
MCH: 31.5 pg (ref 26.0–34.0)
MCHC: 32.8 g/dL (ref 30.0–36.0)
MCV: 96.1 fL (ref 80.0–100.0)
MONO ABS: 0.4 10*3/uL (ref 0.1–1.0)
Monocytes Relative: 8 %
NEUTROS ABS: 3.4 10*3/uL (ref 1.7–7.7)
NEUTROS PCT: 59 %
Platelets: 219 10*3/uL (ref 150–400)
RBC: 4.41 MIL/uL (ref 3.87–5.11)
RDW: 11.8 % (ref 11.5–15.5)
WBC: 5.6 10*3/uL (ref 4.0–10.5)
nRBC: 0 % (ref 0.0–0.2)

## 2018-10-02 LAB — COMPREHENSIVE METABOLIC PANEL
ALBUMIN: 4 g/dL (ref 3.5–5.0)
ALK PHOS: 50 U/L (ref 38–126)
ALT: 42 U/L (ref 0–44)
ANION GAP: 7 (ref 5–15)
AST: 39 U/L (ref 15–41)
BUN: 15 mg/dL (ref 8–23)
CALCIUM: 9.1 mg/dL (ref 8.9–10.3)
CO2: 31 mmol/L (ref 22–32)
Chloride: 103 mmol/L (ref 98–111)
Creatinine, Ser: 0.85 mg/dL (ref 0.44–1.00)
GFR calc Af Amer: >60 mL/min (ref >60)
GFR calc non Af Amer: >60 mL/min (ref >60)
GLUCOSE: 72 mg/dL (ref 70–99)
Potassium: 3.8 mmol/L (ref 3.5–5.1)
SODIUM: 141 mmol/L (ref 135–145)
TOTAL PROTEIN: 6.4 g/dL — AB (ref 6.5–8.1)
Total Bilirubin: 0.5 mg/dL (ref 0.3–1.2)

## 2018-10-08 NOTE — Progress Notes (Signed)
[place]  Telephone:([phone]) [phone] Fax:(336) [phone]    Patient ID: IMA HAFNER  DOB: 11-15-1955, 63 y.o.   MRN: [id number] ID: [name] OB: 03-02-1956  MR#: [id number]  HDQ#:[id number]  Patient Care Team: Kelton Pillar, MD as PCP - General (Family Medicine) , Virgie Dad, MD as Consulting Physician (Oncology) Arloa Koh, MD as Consulting Physician (Radiation Oncology) Armandina Gemma, MD as Consulting Physician (General Surgery) GYN:  Dr. Uvaldo Rising;  Dr. Donalynn Furlong SU: Osborn Coho; [name] OTHER MD: Arloa Koh, Juanito Doom;  Crissie Reese, MD   CHIEF COMPLAINTS:   Estrogen receptor positive breast cancer   CURRENT TREATMENT: Observation   BREAST CANCER HISTORY: From the original intake note:  "Janet [name]" herself noted a lump in her right breast on 03/17/2013, and immediately contacted her physician so that on 03/18/2013 bilateral diagnostic mammography and right breast ultrasonography at the breast Center showed a developing mass in the subareolar region of the right breast. There were no malignant type calcifications. This mass was palpable in the upper outer quadrant, and by ultrasound measured 2.6 cm. Ultrasonography of the right axilla showed a lymph node measuring 1.1 cm, with a thickened cortex. The left breast was unremarkable.  Biopsy of the right breast mass 08/26//2014 showed ([name]) and invasive ductal carcinoma, grade 3, triple negative, with an MIB-1 of 54%. Biopsy of the right axillary lymph node in question at the same time was negative.  Bilateral breast MRI 03/26/2013 measured the right subareolar mass at 3.8 cm. The previously biopsied benign right axillary lymph node was again noted. There were no other suspicious areas in the right breast or axilla and the left breast was unremarkable.  Her subsequent history is as detailed below   INTERVAL HISTORY: Janet [name] returns today for follow-up and treatment of  her triple negative breast cancer.   She continues under observation.   Since her last visit here, she underwent a thyroid ultrasound on 02/01/2018 showing Left upper pole nodule 3 meets criteria for annual follow-up.  (This in keeping with the ACR TI-RADS recommendations - J Am Coll Radiol 1194;17:408-144.)  Also for evaluation of hypercalcemia she underwent a parathyroid scintigraphy and spect imaging on 02/01/2018 showing: Abnormal sestamibi retention at the LEFT inferior parathyroid gland consistent with parathyroid adenoma.  She had this gland removed 02/16/2018 under Dr. Harlow Asa with benign pathology [id number]) and subsequent normalization of her calcium   REVIEW OF SYSTEMS: Janet [name] is currently working to move all of her classes online at [employer] for COVID-19 precautions. She is doing great otherwise. She notes that her mom is not doing to well, however. The patient denies unusual headaches, visual changes, nausea, vomiting, or dizziness. There has been no unusual cough, phlegm production, or pleurisy. This been no change in bowel or bladder habits. The patient denies unexplained fatigue or unexplained weight loss, bleeding, rash, or fever. A detailed review of systems was otherwise noncontributory.     PAST MEDICAL HISTORY: Past Medical History:  Diagnosis Date  . Allergy   . Anemia, unspecified 07/09/2013   chemo  . BRCA1 positive 2015  . Breast cancer ([facility])   . Cancer ([facility])    breast  . Migraines     PAST SURGICAL HISTORY: Past Surgical History:  Procedure Laterality Date  . BREAST RECONSTRUCTION WITH PLACEMENT OF TISSUE EXPANDER AND FLEX HD (ACELLULAR HYDRATED DERMIS) Bilateral 09/30/2013   Procedure: BILATERAL TISSUE EXPANDERS ;  Surgeon: Crissie Reese, MD;  Location: [facility];  Service: Plastics;  Laterality:  Bilateral;  . FOOT SURGERY  age 81   bone graft  . LAPAROSCOPIC BILATERAL SALPINGO OOPHERECTOMY N/A 01/29/2014   Procedure: LAPAROSCOPIC BILATERAL SALPINGO OOPHORECTOMY;   Surgeon: Terrance Mass, MD;  Location: [facility];  Service: Gynecology;  Laterality: N/A;  Dr. Phineas Real to assist.  Dr. Crissie Reese will be following with some reconstructive breast surgery.  His case will take approx 1 1/2 hours and his scheduler will post her part once we have a date. Thanks!  Marland Kitchen MASTECTOMY WITH AXILLARY LYMPH NODE DISSECTION Bilateral 09/30/2013   DR BOWERS  . MASTECTOMY WITH AXILLARY LYMPH NODE DISSECTION Bilateral 09/30/2013   Procedure: BILATERAL MASTECTOMIES RIGHT  AXILLARY LYMPH NODE BIOPSY;  Surgeon: Shann Medal, MD;  Location: [facility];  Service: General;  Laterality: Bilateral;  . PARATHYROIDECTOMY N/A 02/16/2018   Procedure: PARATHYROIDECTOMY;  Surgeon: Armandina Gemma, MD;  Location: [facility];  Service: General;  Laterality: N/A;  . PORT-A-CATH REMOVAL Left 09/30/2013   Procedure: REMOVAL PORT-A-CATH;  Surgeon: Shann Medal, MD;  Location: [facility];  Service: General;  Laterality: Left;  . PORTACATH PLACEMENT N/A 04/04/2013   Procedure: INSERTION PORT-A-CATH;  Surgeon: Haywood Lasso, MD;  Location: [facility];  Service: General;  Laterality: N/A;  . removal of cataracts    . REMOVAL OF TISSUE EXPANDER AND PLACEMENT OF IMPLANT Bilateral 01/29/2014   Procedure: REMOVAL OF BILATERAL TISSUE EXPANDER AND PLACEMENT OF IMPLANT FOR BREAST RECONSTRUCTION;  Surgeon: Crissie Reese, MD;  Location: [facility];  Service: Plastics;  Laterality: Bilateral;    FAMILY HISTORY: Family History  Problem Relation Age of Onset  . Breast cancer Maternal Grandmother 68  . Pancreatic cancer Father 20  . Heart Problems Maternal Uncle   . Ovarian cancer Paternal Aunt        dx in her 45s-60s  . Stomach cancer Other        father's maternal grandmother  . Breast cancer Cousin        Father's maternal cousin's daughter; [place]   [name] died at the age of 48 with pancreatic cancer. (He was Dr. Barbee [name]). [name] is alive at age 42 as of 09/2018. Janet [name] has one brother, no  sisters. [name]'s mother died from breast cancer at the age of 39. There is no history of ovarian cancer in the family.-- The patient has advised her children and the rest of her family regarding her being BRCA+   GYNECOLOGIC HISTORY:  Menarche age 67, first live birth age 64. She is GX P2. She stopped having periods approximately 2004. She did not take hormone replacement. She did take birth control pills remotely for 5-10 years, with no complications.   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 Her husband the [place]. AQuentin Cornwall "Robby" Stecher, is a local judge. The patient's 2 children are [name] who lives in [place] and works as a Public affairs consultant, and [name], who works in [place] and is an associate with a Interior and spatial designer group.  ADVANCED DIRECTIVES: In place.  Patient was also given information about advanced directives so that she may update her healthcare power of attorney. (09/03/2013)   HEALTH MAINTENANCE: (Updated February 2015) Social History   Tobacco Use  . Smoking status: Never  Smoker  . Smokeless tobacco: Never Used  Substance Use Topics  . Alcohol use: Yes    Comment: occasional  . Drug use: No     Colonoscopy: Not on file  PAP:  Oct 2014, Dr. Laurann Montana  Bone density: July 2013 at [facility], showed osteopenia (T-1.9)  Lipid panel: UTD, Dr. Laurann Montana   Allergies  Allergen Reactions  . Opium     Pt prefers to not be put of opioids   . Azithromycin Hives  . Penicillins Rash    Childhood reaction Has patient had a PCN reaction causing immediate rash, facial/tongue/throat swelling, SOB or lightheadedness with hypotension: No Has patient had a PCN reaction causing severe rash involving mucus membranes or skin necrosis: No  Has patient had a PCN reaction that required hospitalization: No Has patient had a PCN reaction occurring within the last 10 years: No If all of the above answers are "NO", then may proceed with Cephalosporin use.    Current Outpatient Medications  Medication Sig Dispense Refill  . cholecalciferol (VITAMIN D) 1000 units tablet Take 1,000 Units by mouth daily.    . cycloSPORINE (RESTASIS) 0.05 % ophthalmic emulsion Place 1 drop into both eyes 2 (two) times daily.  0.4 mL   . ibuprofen (ADVIL,MOTRIN) 200 MG tablet Take 200 mg by mouth every 6 (six) hours as needed for headache or mild pain.    . Multiple Vitamin (MULTIVITAMIN) capsule Take 1 capsule by mouth daily.    . traMADol (ULTRAM) 50 MG tablet Take 1-2 tablets (50-100 mg total) by mouth every 6 (six) hours as needed for moderate pain. 15 tablet 0   No current facility-administered medications for this visit.     OBJECTIVE: Middle-aged white woman in no acute distress  Vitals:   10/09/18 1520  BP: 132/77  Pulse: 78  Resp: 18  SpO2: 100%     Body mass index is 17.96 kg/m.    ECOG FS:0 Filed Weights   10/09/18 1520  Weight: 118 lb 1.6 oz (53.6 kg)   Sclerae unicteric, pupils round and equal No cervical or supraclavicular adenopathy Lungs no rales or rhonchi Heart regular rate and rhythm Abd soft, nontender, positive bowel sounds MSK no focal spinal tenderness, no upper extremity lymphedema Neuro: nonfocal, well oriented, appropriate affect Breasts: Status post bilateral mastectomies.  There is no evidence of chest wall recurrence.  Both axillae are benign. Skin: 1 of her big toenails never did grow normally after her chemotherapy.  The toenails are little bit tight and look slightly ingrown but there is no erythema and certainly no infection or swelling noted   LAB RESULTS:  CMP     Component Value Date/Time   NA 141 10/02/2018 1556   NA 140 11/15/2016 1222   K 3.8 10/02/2018 1556   K 4.2 11/15/2016 1222   CL 103  10/02/2018 1556   CO2 31 10/02/2018 1556   CO2 27 11/15/2016 1222   GLUCOSE 72 10/02/2018 1556   GLUCOSE 92 11/15/2016 1222   BUN 15 10/02/2018 1556   BUN 16.3 11/15/2016 1222   CREATININE 0.85 10/02/2018 1556   CREATININE 0.83 11/20/2017 1418   CREATININE 0.8 11/15/2016 1222   CALCIUM 9.1 10/02/2018 1556   CALCIUM 10.8 (H) 11/07/2017 1138   CALCIUM 11.1 (H) 11/15/2016 1222   PROT 6.4 (L) 10/02/2018 1556   PROT 6.6 11/15/2016 1222   ALBUMIN 4.0 10/02/2018 1556   ALBUMIN 4.1 11/15/2016 1222   AST 39 10/02/2018 1556   AST 31 11/15/2016  1222   ALT 42 10/02/2018 1556   ALT 38 11/15/2016 1222   ALKPHOS 50 10/02/2018 1556   ALKPHOS 75 11/15/2016 1222   BILITOT 0.5 10/02/2018 1556   BILITOT 0.60 11/15/2016 1222   GFRNONAA >60 10/02/2018 1556   GFRNONAA >60 11/20/2017 1418   GFRAA >60 10/02/2018 1556   GFRAA >60 11/20/2017 1418     Lab Results  Component Value Date   WBC 5.6 10/02/2018   NEUTROABS 3.4 10/02/2018   HGB 13.9 10/02/2018   HCT 42.4 10/02/2018   MCV 96.1 10/02/2018   PLT 219 10/02/2018      STUDIES: No results found.   ASSESSMENT: 63 y.o. BRCA1 positive [place] woman   (1)  status post right breast biopsy 03/19/2013 for a clinical T2 N0, stage IIA invasive ductal breast cancer, grade 3, triple negative, with an MIB-1 of 54%.  (2) suspicious right axillary lymph node biopsy negative 03/19/2013  (3) received four cycles of dose dense doxorubicin/ cyclophosphamide between 04/17/2013 and 05/29/2013, at standard doses, with Neulasta support;  (4) on 06/12/2013 started 12 weekly doses of carboplatin/ paclitaxel.  One treatment was held in January secondary to chemotherapy-induced neutropenia, and that cycle was not "made up". Accordingly, patient received a total of 11 weekly doses, with final dose on 09/04/2013.  (5) s/p bilateral mastectomies with immediate expander placement and right-sided sentinel lymph node sampling 09/30/2013 for a residual ypT1a ypN0  invasive ductal carcinoma, grade not ascertained on this sample (read off original biopsy), repeat prognostic panel again progesterone and HER-2 negative, but now estrogen receptor positive at 46% with moderate staining intensity.  (a) considered anti-estrogen therapy but decided against it  (6) s/p bilateral salpingo-oophorectomy 01/29/2014 with benign pathology   PLAN: Janet [name] is now 5 years out from definitive surgery for her breast cancer with no evidence of disease recurrence.  This is very favorable.  We reviewed the BRCA1 mutation and she understands there is some increased risk of pancreatic cancer.  This is in the low percentages however and unfortunately we still have no screening procedure.  If one becomes available in the future I will certainly contact her at that point  Otherwise I am comfortable releasing her to her primary care physician.  All she will need in terms of breast cancer follow-up is a yearly physician chest wall exam.  I will be glad to see Janet [name] again at any point in the future but as of now are making no further routine appointments for her here.  , Virgie Dad, MD  10/09/18 4:12 PM Medical Oncology and Hematology [facility] [address] [place], [place] [address] Tel. [phone]    Fax. [phone]   I, Jacqualyn Posey am acting as a Education administrator for Chauncey Cruel, MD.   I, Lurline Del MD, have reviewed the above documentation for accuracy and completeness, and I agree with the above.

## 2018-10-09 ENCOUNTER — Other Ambulatory Visit: Payer: Self-pay

## 2018-10-09 ENCOUNTER — Inpatient Hospital Stay (HOSPITAL_BASED_OUTPATIENT_CLINIC_OR_DEPARTMENT_OTHER): Payer: BC Managed Care – PPO | Admitting: Oncology

## 2018-10-09 ENCOUNTER — Encounter: Payer: Self-pay | Admitting: Oncology

## 2018-10-09 VITALS — BP 132/77 | HR 78 | Resp 18 | Ht 68.0 in | Wt 118.1 lb

## 2018-10-09 DIAGNOSIS — E041 Nontoxic single thyroid nodule: Secondary | ICD-10-CM

## 2018-10-09 DIAGNOSIS — Z171 Estrogen receptor negative status [ER-]: Principal | ICD-10-CM

## 2018-10-09 DIAGNOSIS — Z1501 Genetic susceptibility to malignant neoplasm of breast: Secondary | ICD-10-CM | POA: Diagnosis not present

## 2018-10-09 DIAGNOSIS — Z1509 Genetic susceptibility to other malignant neoplasm: Secondary | ICD-10-CM

## 2018-10-09 DIAGNOSIS — Z9079 Acquired absence of other genital organ(s): Secondary | ICD-10-CM | POA: Diagnosis not present

## 2018-10-09 DIAGNOSIS — Z800 Family history of malignant neoplasm of digestive organs: Secondary | ICD-10-CM

## 2018-10-09 DIAGNOSIS — Z803 Family history of malignant neoplasm of breast: Secondary | ICD-10-CM

## 2018-10-09 DIAGNOSIS — Z9013 Acquired absence of bilateral breasts and nipples: Secondary | ICD-10-CM | POA: Diagnosis not present

## 2018-10-09 DIAGNOSIS — Z853 Personal history of malignant neoplasm of breast: Secondary | ICD-10-CM

## 2018-10-09 DIAGNOSIS — E210 Primary hyperparathyroidism: Secondary | ICD-10-CM

## 2018-10-09 DIAGNOSIS — Z90722 Acquired absence of ovaries, bilateral: Secondary | ICD-10-CM

## 2018-10-09 DIAGNOSIS — C50411 Malignant neoplasm of upper-outer quadrant of right female breast: Secondary | ICD-10-CM

## 2018-10-09 HISTORY — DX: Nontoxic single thyroid nodule: E04.1

## 2019-05-29 ENCOUNTER — Other Ambulatory Visit: Payer: Self-pay | Admitting: Family Medicine

## 2019-05-29 ENCOUNTER — Other Ambulatory Visit (HOSPITAL_COMMUNITY)
Admission: RE | Admit: 2019-05-29 | Discharge: 2019-05-29 | Disposition: A | Discharge status: Home / Self Care | Payer: BC Managed Care – PPO | Source: Ambulatory Visit | Attending: Family Medicine | Admitting: Family Medicine

## 2019-05-29 DIAGNOSIS — Z124 Encounter for screening for malignant neoplasm of cervix: Secondary | ICD-10-CM | POA: Diagnosis present

## 2019-06-03 LAB — CYTOLOGY - PAP: Diagnosis: NEGATIVE

## 2019-11-19 IMAGING — NM NM PARATHYROID W/ SPECT
2 series · 12 of 12 positions shown · non-contrast
Comparison: None

CLINICAL DATA: Hypercalcemia

EXAM:
NM PARATHYROID SCINTIGRAPHY AND SPECT IMAGING
TECHNIQUE: Following intravenous administration of radiopharmaceutical, early
and 2-hour delayed planar images were obtained in the anterior
projection. Delayed triplanar SPECT images were also obtained at 2
hours.
RADIOPHARMACEUTICALS:  27.3 mCi 3c-00m Sestamibi IV

[Series 1: spect - (id)_(id)_tra · 4.1mm · 4.14mm/px · 6 of 128 frames shown]
[frame 11/128]
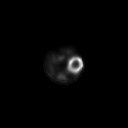
[frame 32/128]
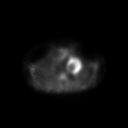
[frame 54/128]
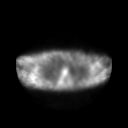
[frame 75/128]
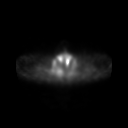
[frame 96/128]
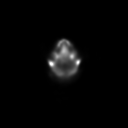
[frame 118/128]
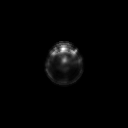

[Series 1: spect - (id)_(id)_cor · 4.1mm · 4.14mm/px · 6 of 128 frames shown]
[frame 11/128]
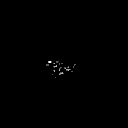
[frame 32/128]
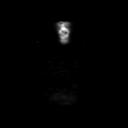
[frame 54/128]
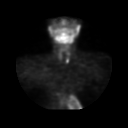
[frame 75/128]
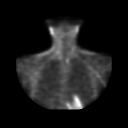
[frame 96/128]
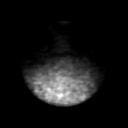
[frame 118/128]
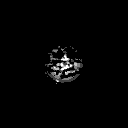

[12 of 12 positions shown; findings below may reference images not displayed]

FINDINGS: Planar imaging: Asymmetric distribution of tracer on initial image
between the thyroid lobes, LEFT greater than RIGHT especially at
LEFT inferior pole. Normal washout of tracer from thyroid tissue on
delayed image with focal abnormal retention of tracer at the
expected position of the LEFT inferior parathyroid gland.

SPECT imaging: Intense abnormal delayed sestamibi retention at the
LEFT inferior parathyroid gland consistent with parathyroid adenoma.
No other definite abnormal retention of tracer is seen at the
positions of the remaining parathyroid glands. No ectopic
localization of tracer in the mediastinum.
IMPRESSION: Abnormal sestamibi retention at the LEFT inferior parathyroid gland
consistent with parathyroid adenoma.

## 2020-06-23 ENCOUNTER — Other Ambulatory Visit: Payer: Self-pay | Admitting: Family Medicine

## 2020-06-23 DIAGNOSIS — M859 Disorder of bone density and structure, unspecified: Secondary | ICD-10-CM

## 2020-10-08 ENCOUNTER — Ambulatory Visit
Admission: RE | Admit: 2020-10-08 | Discharge: 2020-10-08 | Disposition: A | Discharge status: Home / Self Care | Payer: BC Managed Care – PPO | Source: Ambulatory Visit | Attending: Family Medicine | Admitting: Family Medicine

## 2020-10-08 ENCOUNTER — Other Ambulatory Visit: Payer: Self-pay

## 2020-10-08 DIAGNOSIS — M859 Disorder of bone density and structure, unspecified: Secondary | ICD-10-CM

## 2022-06-23 ENCOUNTER — Other Ambulatory Visit: Payer: Self-pay | Admitting: *Deleted

## 2022-06-23 ENCOUNTER — Ambulatory Visit
Admission: RE | Admit: 2022-06-23 | Discharge: 2022-06-23 | Disposition: A | Discharge status: Home / Self Care | Payer: BC Managed Care – PPO | Source: Ambulatory Visit | Attending: Family Medicine | Admitting: Family Medicine

## 2022-06-23 DIAGNOSIS — M79672 Pain in left foot: Secondary | ICD-10-CM

## 2022-06-24 ENCOUNTER — Ambulatory Visit: Payer: Self-pay

## 2022-06-24 ENCOUNTER — Encounter: Payer: Self-pay | Admitting: Family Medicine

## 2022-06-24 ENCOUNTER — Ambulatory Visit: Payer: BC Managed Care – PPO | Admitting: Family Medicine

## 2022-06-24 VITALS — BP 136/66 | Ht 68.0 in | Wt 120.0 lb

## 2022-06-24 DIAGNOSIS — M79672 Pain in left foot: Secondary | ICD-10-CM | POA: Diagnosis not present

## 2022-06-24 NOTE — Progress Notes (Signed)
[place] Attending Note: I have seen and examined this patient. I have discussed this patient with the resident and reviewed the assessment and plan as documented above. I agree with the resident's findings and plan.  She has a significant bruise on the dorsum of her foot. Xray and [place] negative for fx. Her most tender area is over distal 3-4th MTP area. Will treat with shank in shoe, no running until rtc. Do only necessary standing and walking. Stationary bike only if not painful  Activity modification discussed with her and her husband. Call if worse before followup. NOTE: her xrays of foot reveal slightly widened space in 1-2 MT and   cuneiform joint space with odd appearing 1st ray; this is likely secondary to a fracture repaired with bone graft in 1976. She has no ttp there, no real midfoot pain at all with any exam maneuver so I do not think weight bearing films necessary to rule out Lisfranc injury.

## 2022-06-24 NOTE — Progress Notes (Signed)
  SUBJECTIVE:   CHIEF COMPLAINT / HPI:   Janet [name] is a 66 yo with PMH of 1st ray bone graft in 1970's after a non-healing stress fracture presenting with left foot pain. She is an avid runner who runs several miles daily. Yesterday morning following her run she kicked a wooden post with her left foot without a shoe on. Since then she has pain with pushing off to walk on the bottom of her foot. It feels like she is stepping on a thumb tack at her forefoot around her 2nd and 3rd ray. She has taken Aleve since yesterday. She would like to get back to running as soon as possible.  PERTINENT  PMH / PSH: reviewed  Past Medical History:  Diagnosis Date   Allergy    Anemia, unspecified 07/09/2013   chemo   BRCA1 positive 2015   Breast cancer ([facility])    Cancer ([facility])    breast   Migraines    Thyroid nodule 10/09/2018    OBJECTIVE:  BP 136/66   Ht _0  (1.727 m)   Wt 120 lb (54.4 kg)   BMI 18.25 kg/m  Constitutional: well-appearing  MSK:  Left foot- mild swelling to forefoot with large ecchymosis present to 2nd-4th ray of forefoot, TTP to dorsal and plantar surfaces along 2nd-4th rays, no TTP to proximal MTP or navicular, full range of motion in active dorsiflexion and plantarflexion, pain with dorsiflexion at 3rd ray.  Left foot ultrasound: Swelling to 3rd ray, no fracture present  Left foot X-ray: Hallux valgus with narrowing of joint to talonavicular and navicular-cuneiform.   ASSESSMENT/PLAN:  Left foot pain- 2/2 soft tissue injury Xray did not show fracture. She does have some widening of 1st and 2nd ray but no tenderness present to midfoot so less concerning for LisFranc fracture. U/S completed and was reassuring for no fracture. Shank plate provided, she was asked to use this in all shoes to prevent dorsiflexion Minimize walking and standing for next week Follow-up in 1 week  Matina Rodier M. Shanera Meske, D.O.  Internal Medicine Resident, PGY-2 [facility]   Pager: [phone]

## 2022-06-30 ENCOUNTER — Ambulatory Visit: Payer: BC Managed Care – PPO | Admitting: Sports Medicine

## 2022-06-30 VITALS — BP 136/82 | Ht 68.0 in | Wt 120.0 lb

## 2022-06-30 DIAGNOSIS — M79672 Pain in left foot: Secondary | ICD-10-CM | POA: Diagnosis not present

## 2022-06-30 NOTE — Progress Notes (Signed)
   Subjective:    Patient ID: Janet [name], female    DOB: 15-Oct-1955, 66 y.o.   MRN: [id number]  HPI  Patient presents today for follow-up on left foot pain.  She was seen in the office 6 days ago by Dr. Nori Riis.  X-rays and ultrasound at that time showed no evidence of fracture.  She was diagnosed with a contusion and provided with a steel shank for her left shoe.  She is now about 80% improved.  She still has some focal pain over the distal third metatarsal but it is much less than last week.  She denies pain elsewhere in the foot.  She is an avid runner and is anxious to return to exercise.    Review of Systems As above    Objective:   Physical Exam  Well-developed, well-nourished.  No acute distress  Left foot: There is some resolving ecchymosis across the dorsum of the foot.  No soft tissue swelling appreciated.  There is some tenderness to palpation along both the dorsal and plantar aspect of the distal second metatarsal head.  Flexor and extensor tendons are intact.  No other bony tenderness throughout the rest of the foot.  Good pulses.      Assessment & Plan:   Improving left foot pain secondary to contusion  Patient may wean from her steel shank as symptoms allow.  Once she is able to ambulate comfortably in tennis shoes she may resume some recreational walking with a gradual return to running.  I would look for her symptoms to continue to improve.  We will schedule a tentative follow-up appointment for 4 weeks which she may cancel if symptoms resolve.  This note was dictated using Dragon naturally speaking software and may contain errors in syntax, spelling, or content which have not been identified prior to signing this note.

## 2022-07-26 ENCOUNTER — Ambulatory Visit: Payer: BC Managed Care – PPO | Admitting: Sports Medicine

## 2023-07-03 ENCOUNTER — Other Ambulatory Visit: Payer: Self-pay | Admitting: Internal Medicine

## 2023-07-03 DIAGNOSIS — M810 Age-related osteoporosis without current pathological fracture: Secondary | ICD-10-CM

## 2024-02-22 ENCOUNTER — Other Ambulatory Visit: Payer: BC Managed Care – PPO
